# Patient Record
Sex: Female | Born: 1995
Health system: Southern US, Community
[De-identification: ages and names within clinical notes are randomized; demographics above are authoritative.]

## PROBLEM LIST (undated history)

## (undated) DIAGNOSIS — I10 Essential (primary) hypertension: Secondary | ICD-10-CM

## (undated) DIAGNOSIS — Z789 Other specified health status: Secondary | ICD-10-CM

## (undated) DIAGNOSIS — G43909 Migraine, unspecified, not intractable, without status migrainosus: Secondary | ICD-10-CM

## (undated) HISTORY — PX: NO PAST SURGERIES: SHX2092

## (undated) HISTORY — DX: Migraine, unspecified, not intractable, without status migrainosus: G43.909

## (undated) HISTORY — DX: Other specified health status: Z78.9

---
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
## 2014-01-05 ENCOUNTER — Ambulatory Visit (HOSPITAL_COMMUNITY)
Admission: RE | Admit: 2014-01-05 | Discharge: 2014-01-05 | Disposition: A | Discharge status: Home / Self Care | Payer: No Typology Code available for payment source | Source: Ambulatory Visit | Attending: Family Medicine | Admitting: Family Medicine

## 2014-01-05 ENCOUNTER — Ambulatory Visit (INDEPENDENT_AMBULATORY_CARE_PROVIDER_SITE_OTHER): Payer: No Typology Code available for payment source | Admitting: Family Medicine

## 2014-01-05 VITALS — BP 114/71 | HR 84 | Temp 98.1°F | Wt 146.0 lb

## 2014-01-05 DIAGNOSIS — Z3689 Encounter for other specified antenatal screening: Secondary | ICD-10-CM | POA: Insufficient documentation

## 2014-01-05 DIAGNOSIS — Z3480 Encounter for supervision of other normal pregnancy, unspecified trimester: Secondary | ICD-10-CM

## 2014-01-05 NOTE — Progress Notes (Signed)
Brittney OlszewskiGloria Vargas Cummings is a 18 y.o. G2P1001 at 2438w0d for routine follow up.   She reports that she is feeling well.  No bleeding, vaginal discharge, contractions, LOF.  Good fetal movement.   See flow sheet for details.  A/P: Pregnancy at 2838w0d.  Doing well.   Pregnancy issues include - None Repeat anatomy scan was done today.  Awaiting final read. Preterm labor precautions reviewed. Follow up 4 weeks.

## 2014-01-05 NOTE — Patient Instructions (Signed)
You're doing great.  Follow up in 4 weeks - Labs, Glucola and Tdap at that visit.   Second Trimester of Pregnancy The second trimester is from week 13 through week 28, months 4 through 6. The second trimester is often a time when you feel your best. Your body has also adjusted to being pregnant, and you begin to feel better physically. Usually, morning sickness has lessened or quit completely, you may have more energy, and you may have an increase in appetite. The second trimester is also a time when the fetus is growing rapidly. At the end of the sixth month, the fetus is about 9 inches long and weighs about 1 pounds. You will likely begin to feel the baby move (quickening) between 18 and 20 weeks of the pregnancy. BODY CHANGES Your body goes through many changes during pregnancy. The changes vary from woman to woman.   Your weight will continue to increase. You will notice your lower abdomen bulging out.  You may begin to get stretch marks on your hips, abdomen, and breasts.  You may develop headaches that can be relieved by medicines approved by your caregiver.  You may urinate more often because the fetus is pressing on your bladder.  You may develop or continue to have heartburn as a result of your pregnancy.  You may develop constipation because certain hormones are causing the muscles that push waste through your intestines to slow down.  You may develop hemorrhoids or swollen, bulging veins (varicose veins).  You may have back pain because of the weight gain and pregnancy hormones relaxing your joints between the bones in your pelvis and as a result of a shift in weight and the muscles that support your balance.  Your breasts will continue to grow and be tender.  Your gums may bleed and may be sensitive to brushing and flossing.  Dark spots or blotches (chloasma, mask of pregnancy) may develop on your face. This will likely fade after the baby is born.  A dark line from your  belly button to the pubic area (linea nigra) may appear. This will likely fade after the baby is born. WHAT TO EXPECT AT YOUR PRENATAL VISITS During a routine prenatal visit:  You will be weighed to make sure you and the fetus are growing normally.  Your blood pressure will be taken.  Your abdomen will be measured to track your baby's growth.  The fetal heartbeat will be listened to.  Any test results from the previous visit will be discussed. Your caregiver may ask you:  How you are feeling.  If you are feeling the baby move.  If you have had any abnormal symptoms, such as leaking fluid, bleeding, severe headaches, or abdominal cramping.  If you have any questions. Other tests that may be performed during your second trimester include:  Blood tests that check for:  Low iron levels (anemia).  Gestational diabetes (between 24 and 28 weeks).  Rh antibodies.  Urine tests to check for infections, diabetes, or protein in the urine.  An ultrasound to confirm the proper growth and development of the baby.  An amniocentesis to check for possible genetic problems.  Fetal screens for spina bifida and Down syndrome. HOME CARE INSTRUCTIONS   Avoid all smoking, herbs, alcohol, and unprescribed drugs. These chemicals affect the formation and growth of the baby.  Follow your caregiver's instructions regarding medicine use. There are medicines that are either safe or unsafe to take during pregnancy.  Exercise only as  directed by your caregiver. Experiencing uterine cramps is a good sign to stop exercising.  Continue to eat regular, healthy meals.  Wear a good support bra for breast tenderness.  Do not use hot tubs, steam rooms, or saunas.  Wear your seat belt at all times when driving.  Avoid raw meat, uncooked cheese, cat litter boxes, and soil used by cats. These carry germs that can cause birth defects in the baby.  Take your prenatal vitamins.  Try taking a stool  softener (if your caregiver approves) if you develop constipation. Eat more high-fiber foods, such as fresh vegetables or fruit and whole grains. Drink plenty of fluids to keep your urine clear or pale yellow.  Take warm sitz baths to soothe any pain or discomfort caused by hemorrhoids. Use hemorrhoid cream if your caregiver approves.  If you develop varicose veins, wear support hose. Elevate your feet for 15 minutes, 3 4 times a day. Limit salt in your diet.  Avoid heavy lifting, wear low heel shoes, and practice good posture.  Rest with your legs elevated if you have leg cramps or low back pain.  Visit your dentist if you have not gone yet during your pregnancy. Use a soft toothbrush to brush your teeth and be gentle when you floss.  A sexual relationship may be continued unless your caregiver directs you otherwise.  Continue to go to all your prenatal visits as directed by your caregiver. SEEK MEDICAL CARE IF:   You have dizziness.  You have mild pelvic cramps, pelvic pressure, or nagging pain in the abdominal area.  You have persistent nausea, vomiting, or diarrhea.  You have a bad smelling vaginal discharge.  You have pain with urination. SEEK IMMEDIATE MEDICAL CARE IF:   You have a fever.  You are leaking fluid from your vagina.  You have spotting or bleeding from your vagina.  You have severe abdominal cramping or pain.  You have rapid weight gain or loss.  You have shortness of breath with chest pain.  You notice sudden or extreme swelling of your face, hands, ankles, feet, or legs.  You have not felt your baby move in over an hour.  You have severe headaches that do not go away with medicine.  You have vision changes. Document Released: 08/01/2001 Document Revised: 04/09/2013 Document Reviewed: 10/08/2012 [facility] Patient Information 2014 [place], [place].

## 2014-02-03 ENCOUNTER — Ambulatory Visit (INDEPENDENT_AMBULATORY_CARE_PROVIDER_SITE_OTHER): Payer: No Typology Code available for payment source | Admitting: Family Medicine

## 2014-02-03 VITALS — BP 104/59 | HR 87 | Temp 97.2°F | Wt 149.0 lb

## 2014-02-03 DIAGNOSIS — Z3480 Encounter for supervision of other normal pregnancy, unspecified trimester: Secondary | ICD-10-CM

## 2014-02-03 LAB — CBC
HCT: 33.3 % — ABNORMAL LOW (ref 36.0–46.0)
Hemoglobin: 11.1 g/dL — ABNORMAL LOW (ref 12.0–15.0)
MCH: 30.1 pg (ref 26.0–34.0)
MCHC: 33.3 g/dL (ref 30.0–36.0)
MCV: 90.2 fL (ref 78.0–100.0)
PLATELETS: 298 10*3/uL (ref 150–400)
RBC: 3.69 MIL/uL — ABNORMAL LOW (ref 3.87–5.11)
RDW: 14.1 % (ref 11.5–15.5)
WBC: 6.9 10*3/uL (ref 4.0–10.5)

## 2014-02-03 LAB — RPR

## 2014-02-03 LAB — GLUCOSE, CAPILLARY
Comment 1: 1
Glucose-Capillary: 88 mg/dL (ref 70–99)

## 2014-02-03 NOTE — Progress Notes (Signed)
Lula OlszewskiGloria Vargas Hernandez is a 18 y.o. G2P1001 at 7724w1d for routine follow up.   She reports that she is doing well.  Denies bleeding, loss of fluid, contractions.  Good fetal movement. See flow sheet for details.  A/P: Pregnancy at 6324w1d.  Doing well.   Pregnancy issues include - None. Glucola and 28 week labs today. Preterm labor precautions reviewed. Follow up 3 weeks.

## 2014-02-03 NOTE — Patient Instructions (Signed)
It was nice to see you today.  You will not hear from us unless your lab results are abnormal.  You're doing great. Follow up in 3 weeks.

## 2014-02-04 LAB — HIV ANTIBODY (ROUTINE TESTING W REFLEX): HIV 1&2 Ab, 4th Generation: NONREACTIVE

## 2014-02-25 ENCOUNTER — Ambulatory Visit (INDEPENDENT_AMBULATORY_CARE_PROVIDER_SITE_OTHER): Payer: No Typology Code available for payment source | Admitting: Family Medicine

## 2014-02-25 VITALS — BP 120/82 | HR 90 | Wt 152.0 lb

## 2014-02-25 DIAGNOSIS — Z3483 Encounter for supervision of other normal pregnancy, third trimester: Secondary | ICD-10-CM

## 2014-02-25 DIAGNOSIS — Z3480 Encounter for supervision of other normal pregnancy, unspecified trimester: Secondary | ICD-10-CM

## 2014-02-25 DIAGNOSIS — Z23 Encounter for immunization: Secondary | ICD-10-CM

## 2014-02-25 NOTE — Progress Notes (Signed)
Brittney Cummings is a 18 y.o. G2P1001 at 4474w2d for routine follow up.  She reports that she is doing well. No current complaints. No bleeding, LOF.  Good fetal movement.  Occasional contractions earlier this week (2-3 times on Sunday). See flow sheet for details.  A/P: Pregnancy at 5874w2d.  Doing well.   Pregnancy issues include - None. Infant feeding choice - Breast. Contraception choice - OCP. Infant circumcision desired - No. Tdap given today. Preterm labor precautions reviewed. Kick counts reviewed. Follow up 2 weeks.

## 2014-03-12 ENCOUNTER — Ambulatory Visit (INDEPENDENT_AMBULATORY_CARE_PROVIDER_SITE_OTHER): Payer: No Typology Code available for payment source | Admitting: Family Medicine

## 2014-03-12 VITALS — BP 113/77 | HR 100 | Wt 153.8 lb

## 2014-03-12 DIAGNOSIS — Z3483 Encounter for supervision of other normal pregnancy, third trimester: Secondary | ICD-10-CM

## 2014-03-12 DIAGNOSIS — Z3480 Encounter for supervision of other normal pregnancy, unspecified trimester: Secondary | ICD-10-CM

## 2014-03-12 NOTE — Progress Notes (Signed)
Brittney OlszewskiGloria Vargas Cummings is a 18 y.o. G2P1001 at 2170w3d for routine follow up.   She reports that she is doing well.  She is having occasional contractions at night that last for ~ 30 secs.  No regular contractions.  No vaginal bleeding, loss of fluid.  Good Fetal movement.   See flow sheet for details.  A/P: Pregnancy at 1570w3d.  Doing well.   Preterm labor precautions reviewed.  Other urgent reasons to go to [facility] for evaluation reviewed (bleeding, LOF, decreased fetal movement.  Follow up 2 weeks.

## 2014-03-23 ENCOUNTER — Ambulatory Visit (INDEPENDENT_AMBULATORY_CARE_PROVIDER_SITE_OTHER): Payer: No Typology Code available for payment source | Admitting: Family Medicine

## 2014-03-23 VITALS — BP 116/65 | HR 84 | Temp 98.1°F | Wt 154.0 lb

## 2014-03-23 DIAGNOSIS — Z3480 Encounter for supervision of other normal pregnancy, unspecified trimester: Secondary | ICD-10-CM

## 2014-03-23 DIAGNOSIS — Z3483 Encounter for supervision of other normal pregnancy, third trimester: Secondary | ICD-10-CM

## 2014-03-23 NOTE — Progress Notes (Signed)
Lula OlszewskiGloria Vargas Hernandez is a 18 y.o. G2P1001 at 7460w0d for routine follow up.   She reports that she is doing well.  Occasional contractions (2-3 x weekly).  No bleeding. No loss of fluid.  Good fetal movement.   See flow sheet for details.  A/P: Pregnancy at 7260w0d.   No concerns.  Doing well.  Labor precautions reviewed.  Follow up 2 weeks.

## 2014-04-20 ENCOUNTER — Ambulatory Visit (INDEPENDENT_AMBULATORY_CARE_PROVIDER_SITE_OTHER): Payer: No Typology Code available for payment source | Admitting: Family Medicine

## 2014-04-20 VITALS — BP 119/71 | HR 84 | Temp 97.9°F | Wt 160.0 lb

## 2014-04-20 DIAGNOSIS — Z3483 Encounter for supervision of other normal pregnancy, third trimester: Secondary | ICD-10-CM

## 2014-04-20 DIAGNOSIS — Z3480 Encounter for supervision of other normal pregnancy, unspecified trimester: Secondary | ICD-10-CM

## 2014-04-20 NOTE — Progress Notes (Signed)
Brittney Cummings is a 18 y.o. G2P1001 at [redacted]w[redacted]d for routine follow up.   She reports that she is having intermittent contractions (occurs ~ 5 times daily).  They last for ~ 10-15 mins then resolved.  Pain 3-4/10 in severity.  No vaginal bleeding, LOF.  Good fetal movement.    She also reports right upper thigh/leg pain. She has had this for approximately 1 week.  Worse with activity/motion.  No relieving factors.  Associated thigh numbness.   See flow sheet for details.  A/P: Pregnancy at [redacted]w[redacted]d. Irregular contractions. Cervix check (see above).  - Reassured. - Labor precautions discussed  Thigh/leg pain - No sign of DVT on exam - Given history, this could be secondary to sciatica. - Patient reassured. Advised PRN Tylenol, rest, ice/heat.  Pregnancy issues include - None. Infant feeding choice - Breast.  Contraception choice - OCP.  Infant circumcision desired - No.  Labor precautions reviewed.

## 2014-04-20 NOTE — Patient Instructions (Signed)
It was nice to see you today.  You're doing well.  Try tylenol as needed for your pain.  Follow up in 1 week.

## 2014-04-22 ENCOUNTER — Encounter: Payer: Self-pay | Admitting: Family Medicine

## 2014-04-22 LAB — STREP B DNA PROBE: GBSP: DETECTED

## 2014-04-28 ENCOUNTER — Inpatient Hospital Stay (HOSPITAL_COMMUNITY)
Admission: AD | Admit: 2014-04-28 | Discharge: 2014-04-30 | DRG: 775 | Disposition: A | Discharge status: Home / Self Care | Payer: Medicaid Other | Source: Ambulatory Visit | Attending: Family Medicine | Admitting: Family Medicine

## 2014-04-28 ENCOUNTER — Encounter (HOSPITAL_COMMUNITY): Payer: Self-pay | Admitting: *Deleted

## 2014-04-28 ENCOUNTER — Inpatient Hospital Stay (HOSPITAL_COMMUNITY): Payer: Medicaid Other | Admitting: Anesthesiology

## 2014-04-28 ENCOUNTER — Telehealth: Payer: Self-pay | Admitting: Family Medicine

## 2014-04-28 ENCOUNTER — Inpatient Hospital Stay (HOSPITAL_COMMUNITY)
Admission: AD | Admit: 2014-04-28 | Discharge: 2014-04-28 | Disposition: A | Discharge status: Home / Self Care | Payer: Medicaid Other | Source: Ambulatory Visit | Attending: Family Medicine | Admitting: Family Medicine

## 2014-04-28 ENCOUNTER — Encounter (HOSPITAL_COMMUNITY): Payer: Medicaid Other | Admitting: Anesthesiology

## 2014-04-28 DIAGNOSIS — O479 False labor, unspecified: Secondary | ICD-10-CM

## 2014-04-28 DIAGNOSIS — O99892 Other specified diseases and conditions complicating childbirth: Secondary | ICD-10-CM | POA: Diagnosis present

## 2014-04-28 DIAGNOSIS — O368390 Maternal care for abnormalities of the fetal heart rate or rhythm, unspecified trimester, not applicable or unspecified: Secondary | ICD-10-CM

## 2014-04-28 DIAGNOSIS — O9989 Other specified diseases and conditions complicating pregnancy, childbirth and the puerperium: Principal | ICD-10-CM

## 2014-04-28 DIAGNOSIS — IMO0001 Reserved for inherently not codable concepts without codable children: Secondary | ICD-10-CM

## 2014-04-28 DIAGNOSIS — Z22330 Carrier of Group B streptococcus: Secondary | ICD-10-CM

## 2014-04-28 LAB — CBC
HCT: 38.6 % (ref 36.0–46.0)
Hemoglobin: 12.2 g/dL (ref 12.0–15.0)
MCH: 27.9 pg (ref 26.0–34.0)
MCHC: 31.6 g/dL (ref 30.0–36.0)
MCV: 88.3 fL (ref 78.0–100.0)
PLATELETS: 270 10*3/uL (ref 150–400)
RBC: 4.37 MIL/uL (ref 3.87–5.11)
RDW: 14.1 % (ref 11.5–15.5)
WBC: 10.9 10*3/uL — AB (ref 4.0–10.5)

## 2014-04-28 LAB — RPR

## 2014-04-28 LAB — TYPE AND SCREEN
ABO/RH(D): O POS
ANTIBODY SCREEN: NEGATIVE

## 2014-04-28 LAB — OB RESULTS CONSOLE GC/CHLAMYDIA
Chlamydia: NEGATIVE
Gonorrhea: NEGATIVE

## 2014-04-28 LAB — ABO/RH: ABO/RH(D): O POS

## 2014-04-28 MED ORDER — FENTANYL CITRATE 0.05 MG/ML IJ SOLN
100.0000 ug | INTRAMUSCULAR | Status: DC | PRN
  Administered 2014-04-28 (×2): 100 ug via INTRAVENOUS
  Filled 2014-04-28 (×2): qty 2

## 2014-04-28 MED ORDER — OXYCODONE-ACETAMINOPHEN 5-325 MG PO TABS: 2.0000 | ORAL_TABLET | ORAL | Status: DC | PRN

## 2014-04-28 MED ORDER — FENTANYL 2.5 MCG/ML BUPIVACAINE 1/10 % EPIDURAL INFUSION (WH - ANES)
INTRAMUSCULAR | Status: DC | PRN
  Administered 2014-04-28: 14 mL/h via EPIDURAL

## 2014-04-28 MED ORDER — SIMETHICONE 80 MG PO CHEW: 80.0000 mg | CHEWABLE_TABLET | ORAL | Status: DC | PRN

## 2014-04-28 MED ORDER — OXYTOCIN 40 UNITS IN LACTATED RINGERS INFUSION - SIMPLE MED
62.5000 mL/h | INTRAVENOUS | Status: DC
  Filled 2014-04-28: qty 1000

## 2014-04-28 MED ORDER — PHENYLEPHRINE 40 MCG/ML (10ML) SYRINGE FOR IV PUSH (FOR BLOOD PRESSURE SUPPORT)
PREFILLED_SYRINGE | INTRAVENOUS | Status: AC
  Filled 2014-04-28: qty 10

## 2014-04-28 MED ORDER — PENICILLIN G POTASSIUM 5000000 UNITS IJ SOLR
5.0000 10*6.[IU] | Freq: Once | INTRAVENOUS | Status: AC
  Administered 2014-04-28: 5 10*6.[IU] via INTRAVENOUS
  Filled 2014-04-28: qty 5

## 2014-04-28 MED ORDER — EPHEDRINE 5 MG/ML INJ
10.0000 mg | INTRAVENOUS | Status: DC | PRN
  Filled 2014-04-28: qty 2

## 2014-04-28 MED ORDER — BENZOCAINE-MENTHOL 20-0.5 % EX AERO: 1.0000 "application " | INHALATION_SPRAY | CUTANEOUS | Status: DC | PRN

## 2014-04-28 MED ORDER — PRENATAL MULTIVITAMIN CH
1.0000 | ORAL_TABLET | Freq: Every day | ORAL | Status: DC
  Administered 2014-04-29: 1 via ORAL
  Filled 2014-04-28: qty 1

## 2014-04-28 MED ORDER — LANOLIN HYDROUS EX OINT: TOPICAL_OINTMENT | CUTANEOUS | Status: DC | PRN

## 2014-04-28 MED ORDER — ZOLPIDEM TARTRATE 5 MG PO TABS: 5.0000 mg | ORAL_TABLET | Freq: Every evening | ORAL | Status: DC | PRN

## 2014-04-28 MED ORDER — ONDANSETRON HCL 4 MG PO TABS: 4.0000 mg | ORAL_TABLET | ORAL | Status: DC | PRN

## 2014-04-28 MED ORDER — FLEET ENEMA 7-19 GM/118ML RE ENEM: 1.0000 | ENEMA | RECTAL | Status: DC | PRN

## 2014-04-28 MED ORDER — FENTANYL 2.5 MCG/ML BUPIVACAINE 1/10 % EPIDURAL INFUSION (WH - ANES)
INTRAMUSCULAR | Status: AC
  Administered 2014-04-28: 14 mL/h via EPIDURAL
  Filled 2014-04-28: qty 125

## 2014-04-28 MED ORDER — DIPHENHYDRAMINE HCL 25 MG PO CAPS: 25.0000 mg | ORAL_CAPSULE | Freq: Four times a day (QID) | ORAL | Status: DC | PRN

## 2014-04-28 MED ORDER — PHENYLEPHRINE 40 MCG/ML (10ML) SYRINGE FOR IV PUSH (FOR BLOOD PRESSURE SUPPORT)
80.0000 ug | PREFILLED_SYRINGE | INTRAVENOUS | Status: DC | PRN
  Filled 2014-04-28: qty 2

## 2014-04-28 MED ORDER — LACTATED RINGERS IV SOLN
INTRAVENOUS | Status: DC
  Administered 2014-04-28 (×2): via INTRAVENOUS

## 2014-04-28 MED ORDER — LIDOCAINE HCL (PF) 1 % IJ SOLN
INTRAMUSCULAR | Status: DC | PRN
  Administered 2014-04-28 (×2): 8 mL

## 2014-04-28 MED ORDER — BUTORPHANOL TARTRATE 1 MG/ML IJ SOLN
1.0000 mg | INTRAMUSCULAR | Status: DC | PRN
  Administered 2014-04-28: 1 mg via INTRAVENOUS

## 2014-04-28 MED ORDER — DIBUCAINE 1 % RE OINT: 1.0000 "application " | TOPICAL_OINTMENT | RECTAL | Status: DC | PRN

## 2014-04-28 MED ORDER — BUTORPHANOL TARTRATE 1 MG/ML IJ SOLN
1.0000 mg | INTRAMUSCULAR | Status: DC | PRN
  Filled 2014-04-28: qty 1

## 2014-04-28 MED ORDER — ONDANSETRON HCL 4 MG/2ML IJ SOLN: 4.0000 mg | Freq: Four times a day (QID) | INTRAMUSCULAR | Status: DC | PRN

## 2014-04-28 MED ORDER — TETANUS-DIPHTH-ACELL PERTUSSIS 5-2.5-18.5 LF-MCG/0.5 IM SUSP: 0.5000 mL | Freq: Once | INTRAMUSCULAR | Status: DC

## 2014-04-28 MED ORDER — ACETAMINOPHEN 325 MG PO TABS: 650.0000 mg | ORAL_TABLET | ORAL | Status: DC | PRN

## 2014-04-28 MED ORDER — NALOXONE HCL 0.4 MG/ML IJ SOLN
INTRAMUSCULAR | Status: AC
  Filled 2014-04-28: qty 1

## 2014-04-28 MED ORDER — FENTANYL 2.5 MCG/ML BUPIVACAINE 1/10 % EPIDURAL INFUSION (WH - ANES)
14.0000 mL/h | INTRAMUSCULAR | Status: DC | PRN
  Administered 2014-04-28: 14 mL/h via EPIDURAL

## 2014-04-28 MED ORDER — LIDOCAINE HCL (PF) 1 % IJ SOLN
30.0000 mL | INTRAMUSCULAR | Status: DC | PRN
  Filled 2014-04-28: qty 30

## 2014-04-28 MED ORDER — NALBUPHINE HCL 10 MG/ML IJ SOLN
5.0000 mg | INTRAMUSCULAR | Status: DC | PRN
  Administered 2014-04-28: 5 mg via INTRAVENOUS
  Filled 2014-04-28: qty 1

## 2014-04-28 MED ORDER — DIPHENHYDRAMINE HCL 50 MG/ML IJ SOLN: 12.5000 mg | INTRAMUSCULAR | Status: DC | PRN

## 2014-04-28 MED ORDER — OXYTOCIN BOLUS FROM INFUSION
500.0000 mL | INTRAVENOUS | Status: DC
  Administered 2014-04-28: 500 mL via INTRAVENOUS

## 2014-04-28 MED ORDER — OXYCODONE-ACETAMINOPHEN 5-325 MG PO TABS: 1.0000 | ORAL_TABLET | ORAL | Status: DC | PRN

## 2014-04-28 MED ORDER — LACTATED RINGERS IV SOLN
500.0000 mL | INTRAVENOUS | Status: DC | PRN
  Administered 2014-04-28: 500 mL via INTRAVENOUS

## 2014-04-28 MED ORDER — CITRIC ACID-SODIUM CITRATE 334-500 MG/5ML PO SOLN: 30.0000 mL | ORAL | Status: DC | PRN

## 2014-04-28 MED ORDER — EPHEDRINE 5 MG/ML INJ
10.0000 mg | INTRAVENOUS | Status: DC | PRN
Start: 2014-04-28 — End: 2014-04-28
  Filled 2014-04-28: qty 2

## 2014-04-28 MED ORDER — IBUPROFEN 600 MG PO TABS
600.0000 mg | ORAL_TABLET | Freq: Four times a day (QID) | ORAL | Status: DC
  Administered 2014-04-29 – 2014-04-30 (×6): 600 mg via ORAL
  Filled 2014-04-28 (×6): qty 1

## 2014-04-28 MED ORDER — ONDANSETRON HCL 4 MG/2ML IJ SOLN: 4.0000 mg | INTRAMUSCULAR | Status: DC | PRN

## 2014-04-28 MED ORDER — PENICILLIN G POTASSIUM 5000000 UNITS IJ SOLR
2.5000 10*6.[IU] | INTRAVENOUS | Status: DC
  Administered 2014-04-28: 2.5 10*6.[IU] via INTRAVENOUS
  Filled 2014-04-28 (×4): qty 2.5

## 2014-04-28 MED ORDER — LACTATED RINGERS IV SOLN: 500.0000 mL | Freq: Once | INTRAVENOUS | Status: DC

## 2014-04-28 MED ORDER — SENNOSIDES-DOCUSATE SODIUM 8.6-50 MG PO TABS
2.0000 | ORAL_TABLET | ORAL | Status: DC
Start: 2014-04-29 — End: 2014-04-30
  Administered 2014-04-29 (×2): 2 via ORAL
  Filled 2014-04-28 (×2): qty 2

## 2014-04-28 MED ORDER — WITCH HAZEL-GLYCERIN EX PADS: 1.0000 "application " | MEDICATED_PAD | CUTANEOUS | Status: DC | PRN

## 2014-04-28 NOTE — MAU Provider Note (Signed)
First Provider Initiated Contact with Patient 04/28/14 [phone]      Chief Complaint:  Labor Eval   Brittney Cummings is  18 y.o. G2P1001 at [redacted]w[redacted]d presents complaining of Labor Eval She noted contractions started at 5am and have been occuring q5 minutes. She also noted a bloody/mucous discharge this AM. No LOF. Good fetal movement  No issues with prenatal care per the patient; she currently sees Dr. Adriana Simas at [facility].   Obstetrical/Gynecological History: OB History   Grav Para Term Preterm Abortions TAB SAB Ect Mult Living   Past Medical History: Past Medical History  Diagnosis Date  . Medical history non-contributory     Past Surgical History: History reviewed. No pertinent past surgical history.  Family History: History reviewed. No pertinent family history.  Social History: History  Substance Use Topics  . Smoking status: Never Smoker   . Smokeless tobacco: Never Used  . Alcohol Use: No    Allergies:  Allergies  Allergen Reactions  . Mushroom Extract Complex Swelling    Meds:  Prescriptions prior to admission  Medication Sig Dispense Refill  . Prenatal Vit-Fe Fumarate-FA (PRENATAL MULTIVITAMIN) TABS tablet Take 1 tablet by mouth daily at 12 noon.        Review of Systems -   Review of Systems  Constitutional: Negative for fever, chills, weight loss, malaise/fatigue and diaphoresis.  HENT: Negative for hearing loss, ear pain, nosebleeds, congestion, sore throat, neck pain, tinnitus and ear discharge.   Eyes: Negative for blurred vision, double vision, photophobia, pain, discharge and redness.  Respiratory: Negative for cough, hemoptysis, sputum production, shortness of breath, wheezing and stridor.   Cardiovascular: Negative for chest pain, palpitations, orthopnea,  leg swelling  Gastrointestinal: Negative for abdominal pain heartburn, nausea, vomiting, diarrhea, constipation, blood in stool Genitourinary: Negative for  dysuria, urgency, frequency, hematuria and flank pain.  Musculoskeletal: Negative for myalgias, back pain, joint pain and falls.  Skin: Negative for itching and rash.  Neurological: Negative for dizziness, tingling, tremors, sensory change, speech change, focal weakness, seizures, loss of consciousness, weakness and headaches.  Endo/Heme/Allergies: Negative for environmental allergies and polydipsia. Does not bruise/bleed easily.  Psychiatric/Behavioral: Negative for depression, suicidal ideas, hallucinations, memory loss and substance abuse. The patient is not nervous/anxious and does not have insomnia.      Physical Exam  Blood pressure 123/81, pulse 65, temperature 97.8 F (36.6 C), temperature source Oral, resp. rate 16, height 5' (1.524 m), weight 74.39 kg (164 lb), last menstrual period 07/28/2013, SpO2 100.00%, currently breastfeeding. GENERAL: Well-developed, well-nourished female in no acute distress.  LUNGS: Clear to auscultation bilaterally.  HEART: Regular rate and rhythm. ABDOMEN: Soft, nontender, nondistended, gravid.  EXTREMITIES: Nontender, no edema, 2+ distal pulses. DTR's 2+ CERVICAL EXAM: 2.5/70/-2, anterior  Presentation: cephalic FHT:  Baseline rate 130 bpm   Variability moderate  Accelerations present   Decelerations none Contractions: Every 2-6 mins   Assessment: Brittney Cummings is  18 y.o. G2P1001 at [redacted]w[redacted]d presents with contractions q5 minutes with a practically unchanged cervical exam after 3 hours. Did note an audible irregular fetal heart tone (sounds like a occasional additional beat).   Plan: - Not in active labor - Discussed irregular heart tones with Dr. Adrian Blackwater who felt she was okay be be discharged without further work-up. - Labor precautions and kick counts discussed.   Joanna Puff 9/8/20159:55 AM  I was consulted RE:POC and reviewed NST and  agree with above.  [place], CNM 04/28/2014 10:50 PM

## 2014-04-28 NOTE — H&P (Signed)
Brittney Cummings is a 18 y.o. female G2P1001 at [redacted]w[redacted]d who presents with SOL.   Patient had frequent, regular contractions (every 5 mins) which began at ~ 5 am this morning.  She had associated bloody show.  No LOF and good fetal movement.  She subsequently went to the MAU for a labor check.  Her cervix was 2.5 cm and she did not make any cervical change for 3 hours and was thus sent home.  She later returned early this afternoon around noon with worsening pain and more frequent contractions.  Cervix was noted to be 4 cm and she was subsequently admitted to L&D.  PNC: [facility]. GBS +.  No GDM.    History OB History   Grav Para Term Preterm Abortions TAB SAB Ect Mult Living   Past Medical History  Diagnosis Date  . Medical history non-contributory    History reviewed. No pertinent past surgical history. Family History: No pertinent family Hx.  Social History:  reports that she has never smoked. She has never used smokeless tobacco. She reports that she does not drink alcohol or use illicit drugs.   Prenatal Transfer Tool  Maternal Diabetes: No; Normal 1 hour gtt (88) Genetic Screening: Normal Maternal Ultrasounds/Referrals: Normal Fetal Ultrasounds or other Referrals:  None Maternal Substance Abuse:  No Significant Maternal Medications:  None Significant Maternal Lab Results:  Lab values include: Group B Strep positive  ROS Per HPI  Blood pressure 124/75, pulse 56, temperature 98.2 F (36.8 C), resp. rate 20, height 5' (1.524 m), weight 164 lb (74.39 kg), last menstrual period 07/28/2013, SpO2 99.00%, currently breastfeeding. Exam Physical Exam  Gen: well appearing female. NAD.  Heart: RRR Lungs: CTAB, NWOB Abd: gravid but otherwise soft, nontender to palpation Ext: no appreciable lower extremity edema bilaterally Neuro: grossly nonfocal, speech intact Cervical Exam:  Dilation: 4 Effacement (%): 80 Cervical Position:  Anterior Station: -1 Presentation: Vertex Exam by:: Quintella Baton rNC  FHR: baseline 115, Mod variability, 15x15 accels, no decels Toco: ~ Every 2 min, Regular  Prenatal labs: ABO, Rh: O/POS/-- (03/23 0931) Antibody: NEG (03/23 0931) Rubella: 3.10 (03/23 0931) RPR: NON REAC (06/16 1001)  HBsAg: NEGATIVE (03/23 0931)  HIV: NONREACTIVE (06/16 1001)  GBS: Detected (08/31 1525)   Assessment/Plan: Brittney Cummings is a 18 y.o. G2P1001 at [redacted]w[redacted]d by LMP who presents with SOL.   #Labor: SOL, progressing normally. #Pain: Fentanyl currently; switching to Nubain; Patient does not desire Epidural #FWB: Cat I #ID: GBS +; Treating with PCN. #MOF: Breast #MOC: OCP #Circ: Not desired.  Everlene Other 04/28/2014, 1:43 PM  I was consulted RE: POC and and agree with the above.  [place], [place] 04/28/2014 10:47 PM

## 2014-04-28 NOTE — Telephone Encounter (Signed)
Went to [employer] [employer] this morning with contractions 5 mins apart. Hospital discharged her--she was dilated 2-3 cm Pains are 2 minutes apart and stronger What should she do?

## 2014-04-28 NOTE — H&P (Signed)
Attestation of Attending Supervision of Advanced Practitioner (PA/CNM/NP): Evaluation and management procedures were performed by the Advanced Practitioner under my supervision and collaboration.  I have reviewed the Advanced Practitioner's note and chart, and I agree with the management and plan.  Areona Homer, DO Attending Physician Faculty Practice, [facility]  

## 2014-04-28 NOTE — MAU Note (Signed)
Contractions started at 5am and are every 5 mins. Had a bloody discharge as well. Denies LOF. Has felt decrease in movement since 5 am but felt movement prior to that.

## 2014-04-28 NOTE — Anesthesia Procedure Notes (Signed)
Epidural Patient location during procedure: OB Start time: 04/28/2014 4:34 PM End time: 04/28/2014 4:38 PM  Staffing Anesthesiologist: Leilani Able Performed by: anesthesiologist   Preanesthetic Checklist Completed: patient identified, surgical consent, pre-op evaluation, timeout performed, IV checked, risks and benefits discussed and monitors and equipment checked  Epidural Patient position: sitting Prep: site prepped and draped and DuraPrep Patient monitoring: continuous pulse ox and blood pressure Approach: midline Location: L3-L4 Injection technique: LOR air  Needle:  Needle type: Tuohy  Needle gauge: 17 G Needle length: 9 cm and 9 Needle insertion depth: 5 cm cm Catheter type: closed end flexible Catheter size: 19 Gauge Catheter at skin depth: 10 cm Test dose: negative and Other  Assessment Sensory level: T9 Events: blood not aspirated, injection not painful, no injection resistance, negative IV test and no paresthesia  Additional Notes Reason for block:procedure for pain

## 2014-04-28 NOTE — Progress Notes (Signed)
Ivonne Andrew CNM in delivery in BS. Notified via Lawyer in BS with CNM. Aware of pt's admission and status. Will admit to [facility]

## 2014-04-28 NOTE — MAU Provider Note (Signed)
Attestation of Attending Supervision of Advanced Practitioner (PA/CNM/NP): Evaluation and management procedures were performed by the Advanced Practitioner under my supervision and collaboration.  I have reviewed the Advanced Practitioner's note and chart, and I agree with the management and plan.  Jacob Stinson, DO Attending Physician Faculty Practice, [facility]  

## 2014-04-28 NOTE — Progress Notes (Signed)
Irregularity noted to FHR. No particular pattern

## 2014-04-28 NOTE — Telephone Encounter (Signed)
Unable to reach patient by phone. It looks like the patient was admitted to [facility] today around 12 pm.Busick, Rodena Medin

## 2014-04-28 NOTE — Progress Notes (Signed)
Report called to [employer] in Bs.

## 2014-04-28 NOTE — Progress Notes (Signed)
Brittney Cummings is a 18 y.o. G2P1001 at [redacted]w[redacted]d by LMP admitted for SOL.  Subjective: Pain is severe, 8/10. Patient has tried fentanyl, stadol, and nubain without significant relief.  Objective: BP 109/78  Pulse 61  Temp(Src) 96.7 F (35.9 C) (Axillary)  Resp 20  Ht 5' (1.524 m)  Wt 164 lb (74.39 kg)  BMI 32.03 kg/m2  SpO2 100%  LMP 07/28/2013      FHT:  FHR: 110 bpm, variability: moderate,  accelerations:  Present,  decelerations:  Absent UC:   Regular, Every 2-71min SVE:   Dilation: 6 Effacement (%): 90 Station: -1 Exam by:: Elana Alm RNC  Labs: Lab Results  Component Value Date   WBC 10.9* 04/28/2014   HGB 12.2 04/28/2014   HCT 38.6 04/28/2014   MCV 88.3 04/28/2014   PLT 270 04/28/2014    Assessment / Plan: Spontaneous labor, progressing normally  Labor: Progressing normally Fetal Wellbeing:  Category I Pain Control:  Now desires epidural I/D:  GBS +; PCN. Anticipated MOD:  NSVD  Everlene Other 04/28/2014, 4:10 PM

## 2014-04-28 NOTE — MAU Note (Signed)
Was seen here earlier this am for contractions. Stronger now

## 2014-04-28 NOTE — Anesthesia Preprocedure Evaluation (Signed)
Anesthesia Evaluation  Patient identified by MRN, date of birth, ID band Patient awake    Reviewed: Allergy & Precautions, H&P , NPO status , Patient's Chart, lab work & pertinent test results  Airway Mallampati: II TM Distance: >3 FB Neck ROM: full    Dental no notable dental hx.    Pulmonary neg pulmonary ROS,    Pulmonary exam normal       Cardiovascular negative cardio ROS      Neuro/Psych negative neurological ROS  negative psych ROS   GI/Hepatic negative GI ROS, Neg liver ROS,   Endo/Other  negative endocrine ROS  Renal/GU negative Renal ROS     Musculoskeletal   Abdominal Normal abdominal exam  (+)   Peds  Hematology negative hematology ROS (+)   Anesthesia Other Findings   Reproductive/Obstetrics (+) Pregnancy                           Anesthesia Physical Anesthesia Plan  ASA: II  Anesthesia Plan: Epidural   Post-op Pain Management:    Induction:   Airway Management Planned:   Additional Equipment:   Intra-op Plan:   Post-operative Plan:   Informed Consent: I have reviewed the patients History and Physical, chart, labs and discussed the procedure including the risks, benefits and alternatives for the proposed anesthesia with the patient or authorized representative who has indicated his/her understanding and acceptance.     Plan Discussed with:   Anesthesia Plan Comments:         Anesthesia Quick Evaluation  

## 2014-04-28 NOTE — Discharge Instructions (Signed)
Third Trimester of Pregnancy The third trimester is from week 29 through week 42, months 7 through 9. This trimester is when your unborn baby (fetus) is growing very fast. At the end of the ninth month, the unborn baby is about 20 inches in length. It weighs about 6-10 pounds.  HOME CARE   Avoid all smoking, herbs, and alcohol. Avoid drugs not approved by your doctor.  Only take medicine as told by your doctor. Some medicines are safe and some are not during pregnancy.  Exercise only as told by your doctor. Stop exercising if you start having cramps.  Eat regular, healthy meals.  Wear a good support bra if your breasts are tender.  Do not use hot tubs, steam rooms, or saunas.  Wear your seat belt when driving.  Avoid raw meat, uncooked cheese, and liter boxes and soil used by cats.  Take your prenatal vitamins.  Try taking medicine that helps you poop (stool softener) as needed, and if your doctor approves. Eat more fiber by eating fresh fruit, vegetables, and whole grains. Drink enough fluids to keep your pee (urine) clear or pale yellow.  Take warm water baths (sitz baths) to soothe pain or discomfort caused by hemorrhoids. Use hemorrhoid cream if your doctor approves.  If you have puffy, bulging veins (varicose veins), wear support hose. Raise (elevate) your feet for 15 minutes, 3-4 times a day. Limit salt in your diet.  Avoid heavy lifting, wear low heels, and sit up straight.  Rest with your legs raised if you have leg cramps or low back pain.  Visit your dentist if you have not gone during your pregnancy. Use a soft toothbrush to brush your teeth. Be gentle when you floss.  You can have sex (intercourse) unless your doctor tells you not to.  Do not travel far distances unless you must. Only do so with your doctor's approval.  Take prenatal classes.  Practice driving to the hospital.  Pack your hospital bag.  Prepare the baby's room.  Go to your doctor visits. GET  HELP IF:  You are not sure if you are in labor or if your water has broken.  You are dizzy.  You have mild cramps or pressure in your lower belly (abdominal).  You have a nagging pain in your belly area.  You continue to feel sick to your stomach (nauseous), throw up (vomit), or have watery poop (diarrhea).  You have bad smelling fluid coming from your vagina.  You have pain with peeing (urination). GET HELP RIGHT AWAY IF:   You have a fever.  You are leaking fluid from your vagina.  You are spotting or bleeding from your vagina.  You have severe belly cramping or pain.  You lose or gain weight rapidly.  You have trouble catching your breath and have chest pain.  You notice sudden or extreme puffiness (swelling) of your face, hands, ankles, feet, or legs.  You have not felt the baby move in over an hour.  You have severe headaches that do not go away with medicine.  You have vision changes. Document Released: 11/01/2009 Document Revised: 12/02/2012 Document Reviewed: 10/08/2012 ExitCare Patient Information 2015 [employer]. This information is not intended to replace advice given to you by your health care provider. Make sure you discuss any questions you have with your health care provider.  

## 2014-04-29 LAB — CBC
HEMATOCRIT: 30.2 % — AB (ref 36.0–46.0)
Hemoglobin: 10 g/dL — ABNORMAL LOW (ref 12.0–15.0)
MCH: 28.6 pg (ref 26.0–34.0)
MCHC: 33.1 g/dL (ref 30.0–36.0)
MCV: 86.3 fL (ref 78.0–100.0)
PLATELETS: 247 10*3/uL (ref 150–400)
RBC: 3.5 MIL/uL — ABNORMAL LOW (ref 3.87–5.11)
RDW: 13.9 % (ref 11.5–15.5)
WBC: 12.5 10*3/uL — AB (ref 4.0–10.5)

## 2014-04-29 LAB — GC/CHLAMYDIA PROBE AMP
CT PROBE, AMP APTIMA: NEGATIVE
GC PROBE AMP APTIMA: NEGATIVE

## 2014-04-29 NOTE — Progress Notes (Signed)
UR chart review completed.  

## 2014-04-29 NOTE — Lactation Note (Signed)
This note was copied from the chart of Brittney Cummings. Lactation Consultation Note Initial visit at 21 hours of age.  Mom reports baby just finished a feeding and is now held by visitor and mom is eating.  Mom has 2 months experience with breast feeding older child, but when she started birth control pills baby stopped drinking milk from breast and bottle mom think it was the taste of the milk.  Mom does plan to continue same birth control plan this time and cautioned baby may react different.  [facility] LC resources given and discussed.  Encouraged to feed with early cues on demand.  Early newborn behavior discussed.  Mom reports doing hand expressin with colostrum visible.  Mom to call for assist as needed.    Patient Name: Brittney Tamina Cyphers Today's Date: 04/29/2014 Reason for consult: Initial assessment   Maternal Data Has patient been taught Hand Expression?: Yes Does the patient have breastfeeding experience prior to this delivery?: Yes  Feeding Feeding Type: Breast Fed Length of feed: 25 min  LATCH Score/Interventions                Intervention(s): Breastfeeding basics reviewed     Lactation Tools Discussed/Used WIC Program: Yes   Consult Status Consult Status: Follow-up Date: 04/30/14 Follow-up type: In-patient    [place]w, Arvella Merles 04/29/2014, 5:52 PM

## 2014-04-29 NOTE — Anesthesia Postprocedure Evaluation (Signed)
Anesthesia Post Note  Patient: Brittney Cummings  Procedure(s) Performed: * No procedures listed *  Anesthesia type: Epidural  Patient location: Mother/Baby  Post pain: Pain level controlled  Post assessment: Post-op Vital signs reviewed  Last Vitals:  Filed Vitals:   04/29/14 0300  BP: 113/50  Pulse: 77  Temp: 37.2 C  Resp: 18    Post vital signs: Reviewed  Level of consciousness: awake  Complications: No apparent anesthesia complications

## 2014-04-29 NOTE — Progress Notes (Signed)
Post Partum Day 1  Subjective: Doing well, no complaints. Up ad lib. Voiding well. Passing flatus. Lochia - minimal.  Objective: Blood pressure 113/50, pulse 77, temperature 98.9 F (37.2 C), temperature source Oral, resp. rate 18, height 5' (1.524 m), weight 164 lb (74.39 kg), last menstrual period 07/28/2013, SpO2 99.00%, unknown if currently breastfeeding.  Physical Exam:  General: alert, cooperative and no distress Lochia: appropriate Uterine Fundus: firm DVT Evaluation: No evidence of DVT seen on physical exam. No cords or calf tenderness. No significant calf/ankle edema.   Recent Labs  04/28/14 1210  HGB 12.2  HCT 38.6   Assessment/Plan: PPD # 1, NSVD. Doing well.  Planning OCP for birth control. Breastfeeding. Hopeful for D/C tomorrow pending status of baby.    LOS: 1 day   Everlene Other 04/29/2014, 6:27 AM   OB fellow attestation Post Partum Day 1 I have seen and examined this patient and agree with above documentation in the resident's note.   Brittney Cummings is a 18 y.o. [id number] s/p NSVD on 9/9 at 2000.  Pt denies problems with ambulating, voiding or po intake. Pain is well controlled.  Plan for birth control is oral progesterone-only contraceptive.  Method of Feeding: breast  PE:  BP 113/50  Pulse 77  Temp(Src) 98.9 F (37.2 C) (Oral)  Resp 18  Ht 5' (1.524 m)  Wt 164 lb (74.39 kg)  BMI 32.03 kg/m2  SpO2 99%  LMP 07/28/2013  Breastfeeding? Unknown Fundus firm  Plan for discharge: PPD2  William Dalton, MD 7:55 AM

## 2014-04-30 MED ORDER — IBUPROFEN 600 MG PO TABS: 600.0000 mg | ORAL_TABLET | Freq: Four times a day (QID) | ORAL | Status: DC

## 2014-04-30 MED ORDER — DOCUSATE SODIUM 100 MG PO CAPS: 100.0000 mg | ORAL_CAPSULE | Freq: Two times a day (BID) | ORAL | Status: DC | PRN

## 2014-04-30 MED ORDER — PRENATAL MULTIVITAMIN CH: 1.0000 | ORAL_TABLET | Freq: Every day | ORAL | Status: DC

## 2014-04-30 NOTE — Discharge Instructions (Signed)

## 2014-04-30 NOTE — Discharge Summary (Signed)
Obstetric Discharge Summary Reason for Admission: onset of labor Prenatal Procedures: ultrasound Intrapartum Procedures: spontaneous vaginal delivery Postpartum Procedures: none Complications-Operative and Postpartum: none  Hemoglobin  Date Value Ref Range Status  04/29/2014 10.0* 12.0 - 15.0 g/dL Final     DELTA CHECK NOTED     REPEATED TO VERIFY     HCT  Date Value Ref Range Status  04/29/2014 30.2* 36.0 - 46.0 % Final    Physical Exam:  General: alert, cooperative and no distress Lochia: appropriate Uterine Fundus: firm DVT Evaluation: No evidence of DVT seen on physical exam. No cords or calf tenderness. No significant calf/ankle edema.  Discharge Diagnoses: Term Pregnancy-delivered  Brief Hospital Course:  18 year old G2P1001 presented with active labor.  Patient progressed well, but labor became protracted at ~ 6cm.   At that time AROM was done and patient progressed quickly and delivered a viable female via NSVD.  No complications post partum.  Patient is breastfeeding and planning OCP for contraception.  Delivery Note At 7:58 PM a viable female was delivered via Vaginal, Spontaneous Delivery (Presentation: ROA).  APGAR:  9,9; weight - pending.  Placenta status: Intact, Spontaneous.  Cord:  3 vessel with the following complications: None.  Cord pH: N/A.  Anesthesia: Epidural  Episiotomy: N/A Lacerations: None Est. Blood Loss (mL): 200 mL  Mother delivered over intact perineum.  Baby vigorous with Apgars of 9 & 9. Placenta delivered quickly without difficulty.  Minimal bleeding.   Mom to postpartum.  Baby to Couplet care / Skin to Skin.  Discharge Information: Date: 04/30/2014 Activity: pelvic rest Diet: routine Medications: PNV, Ibuprofen and Colace Condition: stable Instructions: refer to practice specific booklet Discharge to: home   Newborn Data: Live born female  Birth Weight: 6 lb 12.5 oz (3075 g) APGAR: 9, 9  Home with mother.  Everlene Other 04/30/2014,  6:31 AM  Pt D/C'd before CNM able to round behind. I reviewed the chart and agree with above.  [place], CNM 04/30/2014 12:16 PM  Attestation of Attending Supervision of Advanced Practitioner (PA/CNM/NP): Evaluation and management procedures were performed by the Advanced Practitioner under my supervision and collaboration.  I have reviewed the Advanced Practitioner's note and chart, and I agree with the management and plan.  Candelaria Celeste, DO Attending Physician Faculty Practice, [facility] [place]

## 2014-05-01 ENCOUNTER — Encounter: Payer: No Typology Code available for payment source | Admitting: Family Medicine

## 2014-06-22 ENCOUNTER — Encounter (HOSPITAL_COMMUNITY): Payer: Self-pay | Admitting: *Deleted

## 2017-09-15 ENCOUNTER — Ambulatory Visit (INDEPENDENT_AMBULATORY_CARE_PROVIDER_SITE_OTHER): Payer: Self-pay

## 2017-09-15 ENCOUNTER — Encounter (HOSPITAL_COMMUNITY): Payer: Self-pay | Admitting: Family Medicine

## 2017-09-15 ENCOUNTER — Ambulatory Visit (HOSPITAL_COMMUNITY)
Admission: EM | Admit: 2017-09-15 | Discharge: 2017-09-15 | Disposition: A | Discharge status: Home / Self Care | Payer: Self-pay | Attending: Internal Medicine | Admitting: Internal Medicine

## 2017-09-15 DIAGNOSIS — S82001A Unspecified fracture of right patella, initial encounter for closed fracture: Secondary | ICD-10-CM

## 2017-09-15 DIAGNOSIS — W101XXA Fall (on)(from) sidewalk curb, initial encounter: Secondary | ICD-10-CM

## 2017-09-15 MED ORDER — NAPROXEN 500 MG PO TABS: 500.0000 mg | ORAL_TABLET | Freq: Two times a day (BID) | ORAL | 0 refills | Status: DC

## 2017-09-15 NOTE — ED Provider Notes (Signed)
[facility]    CSN: [id number] Arrival date & time: 09/15/17  1750     History   Chief Complaint Chief Complaint  Patient presents with  . Fall  . Knee Pain    HPI Lula OlszewskiGloria Vargas Hernandez is a 22 y.o. female.   Presents with left knee pain after falling and landing on a curb. She reports hitting the front of her knee on the curb. Pain with weight bearing and in the "front" of her knee. Pain with flexion. She has not noted any swelling.       Past Medical History:  Diagnosis Date  . Medical history non-contributory     Patient Active Problem List   Diagnosis Date Noted  . Fetal arrhythmia affecting pregnancy, antepartum 04/28/2014  . Active labor 04/28/2014  . Supervision of other normal pregnancy 11/17/2013    History reviewed. No pertinent surgical history.  OB History    Gravida Para Term Preterm AB Living   2 2 2     2    SAB TAB Ectopic Multiple Live Births           2       Home Medications    Prior to Admission medications   Medication Sig Start Date End Date Taking? Authorizing Provider  docusate sodium (COLACE) 100 MG capsule Take 1 capsule (100 mg total) by mouth 2 (two) times daily as needed for mild constipation or moderate constipation. 04/30/14   Tommie Samsook, Jayce G, DO  ibuprofen (ADVIL,MOTRIN) 600 MG tablet Take 1 tablet (600 mg total) by mouth every 6 (six) hours. 04/30/14   Tommie Samsook, Jayce G, DO  Prenatal Vit-Fe Fumarate-FA (PRENATAL MULTIVITAMIN) TABS tablet Take 1 tablet by mouth daily at 12 noon. 04/30/14   Tommie Samsook, Jayce G, DO    Family History History reviewed. No pertinent family history.  Social History Social History   Tobacco Use  . Smoking status: Never Smoker  . Smokeless tobacco: Never Used  Substance Use Topics  . Alcohol use: No  . Drug use: No     Allergies   Mushroom extract complex   Review of Systems Review of Systems  All other systems reviewed and are negative.    Physical Exam Triage Vital Signs ED  Triage Vitals  Enc Vitals Group     BP 09/15/17 1920 133/84     Pulse Rate 09/15/17 1920 89     Resp 09/15/17 1920 18     Temp 09/15/17 1920 98.5 F (36.9 C)     Temp src --      SpO2 09/15/17 1920 99 %     Weight --      Height --      Head Circumference --      Peak Flow --      Pain Score 09/15/17 1919 6     Pain Loc --      Pain Edu? --      Excl. in GC? --    No data found.  Updated Vital Signs BP 133/84   Pulse 89   Temp 98.5 F (36.9 C)   Resp 18   LMP 08/25/2017   SpO2 99%   Visual Acuity Right Eye Distance:   Left Eye Distance:   Bilateral Distance:    Right Eye Near:   Left Eye Near:    Bilateral Near:     Physical Exam  Constitutional: She appears well-developed and well-nourished. No distress.  Musculoskeletal:  No swelling, limited flexion due to pain,  pain to palpation along the lower patella pole, no knee effusion  Skin: Skin is warm and dry. She is not diaphoretic.  Psychiatric: Her behavior is normal.  Nursing note and vitals reviewed.    UC Treatments / Results  Labs (all labs ordered are listed, but only abnormal results are displayed) Labs Reviewed - No data to display  EKG  EKG Interpretation None       Radiology No results found.  Procedures Procedures (including critical care time)  Medications Ordered in UC Medications - No data to display   Initial Impression / Assessment and Plan / UC Course  I have reviewed the triage vital signs and the nursing notes.  Pertinent labs & imaging results that were available during my care of the patient were reviewed by me and considered in my medical decision making (see chart for details).     Non-displaced probable fracture. Will treat with immobilizer in extension, and non weight bear until f/u with Orthopedics for full evaluation and recommendation. Ice and elevate over the next few days. FU as needed.   Final Clinical Impressions(s) / UC Diagnoses   Final diagnoses:  None     ED Discharge Orders    None       Controlled Substance Prescriptions [place] Controlled Substance Registry consulted? Not Applicable   Sharin Mons 09/15/17 2013

## 2017-09-15 NOTE — ED Triage Notes (Signed)
Pt here for fall leaving work today. Falling on right knee. Painful to ambulate.

## 2017-09-15 NOTE — Discharge Instructions (Signed)
Suggest you get crutches from a drugstore as this will be cheaper for you. No weight bear. Wear the leg brace in extension and leave on in bed. FU with the Orthopedic on Monday or Tuesday for further instructions regarding weight bearing and ROM.

## 2017-09-17 ENCOUNTER — Encounter (INDEPENDENT_AMBULATORY_CARE_PROVIDER_SITE_OTHER): Payer: Self-pay | Admitting: Physician Assistant

## 2017-09-17 ENCOUNTER — Ambulatory Visit (INDEPENDENT_AMBULATORY_CARE_PROVIDER_SITE_OTHER): Payer: Self-pay | Admitting: Physician Assistant

## 2017-09-17 VITALS — Ht 59.0 in | Wt 164.0 lb

## 2017-09-17 DIAGNOSIS — M25561 Pain in right knee: Secondary | ICD-10-CM

## 2017-09-17 NOTE — Progress Notes (Signed)
Office Visit Note   Patient: Brittney OlszewskiGloria Vargas Cummings           Date of Birth: 10/04/1995           MRN: [id number] Visit Date: 09/17/2017              Requested by: Freddrick MarchAmin, Yashika, MD [address] [place], [place] [address] PCP: Freddrick MarchAmin, Yashika, MD   Assessment & Plan: Visit Diagnoses:  1. Acute pain of right knee     Plan: I explained to Brittney Cummings that that this could be just a right patella bony contusion versus a nondisplaced inferior pole patella fracture or a patella tendon injury.  Did discuss with her that the fact that she is able to do a slight straight leg raise but with his significant pain to the patella tendon at least is intact.  Recommend that she remained in the knee immobilizer and even wear this while sleeping.  She is not to bend the knee.  She can be weightbearing as tolerated on the leg in the knee immobilizer.  Ice elevation encouraged.  She will continue her Naprosyn.  Written her out of work for the next 2 weeks.  We will have her follow-up in 2 weeks at that time we will obtain lateral view of her right knee.  Questions encouraged and answered.  Follow-Up Instructions: Return in about 2 weeks (around 10/01/2017).   Orders:  No orders of the defined types were placed in this encounter.  No orders of the defined types were placed in this encounter.     Procedures: No procedures performed   Clinical Data: No additional findings.   Subjective: Chief Complaint  Patient presents with  . Right Knee - Fracture    HPI Brittney Cummings is a 22 year old female worsening for the first time for a right knee injury.  She reports that while leaving work on 09/15/2017 she was walking down the hill towards her car and fell she was able to push herself up but fell again falling onto her knee and onto the concrete.  She was then seen in the [facility] where x-rays were obtained.  These showed a possible nondisplaced inferior pole patella fracture.  No other fractures  identified.  Otherwise knee is well located.  Personally reviewed these films.  She is placed in a knee immobilizer which she is been wearing even while sleeping.  She has been using crutches to ambulate.  She is taking naproxen for pain. Review of Systems   Objective: Vital Signs: Ht 4\' 11"  (1.499 m)   Wt 164 lb (74.4 kg)   LMP 08/25/2017   BMI 33.12 kg/m   Physical Exam  Constitutional: She is oriented to person, place, and time. She appears well-developed and well-nourished. No distress.  Pulmonary/Chest: Effort normal.  Neurological: She is alert and oriented to person, place, and time.  Skin: She is not diaphoretic.  Psychiatric: She has a normal mood and affect.    Ortho Exam Right knee she has tenderness at the inferior pole of the patella.  She is able to do a straight leg raise but has considerable amount of pain with this.  Slight effusion in the knee.  No ecchymosis erythema.  There is a slight abrasion at the inferior pole of patella region.  No signs of infection.  No instability valgus varus stressing.  She has no tenderness along medial lateral joint line.  Right calf supple nontender. Specialty Comments:  No specialty comments available.  Imaging: No results found.   PMFS History: Patient Active Problem List   Diagnosis Date Noted  . Fetal arrhythmia affecting pregnancy, antepartum 04/28/2014  . Active labor 04/28/2014  . Supervision of other normal pregnancy 11/17/2013   Past Medical History:  Diagnosis Date  . Medical history non-contributory     No family history on file.  No past surgical history on file. Social History   Occupational History  . Not on file  Tobacco Use  . Smoking status: Never Smoker  . Smokeless tobacco: Never Used  Substance and Sexual Activity  . Alcohol use: No  . Drug use: No  . Sexual activity: Yes    Birth control/protection: Condom, None    Comment: preg

## 2017-10-01 ENCOUNTER — Ambulatory Visit (INDEPENDENT_AMBULATORY_CARE_PROVIDER_SITE_OTHER): Payer: Self-pay | Admitting: Physician Assistant

## 2017-10-01 ENCOUNTER — Encounter (INDEPENDENT_AMBULATORY_CARE_PROVIDER_SITE_OTHER): Payer: Self-pay | Admitting: Physician Assistant

## 2017-10-01 ENCOUNTER — Ambulatory Visit (INDEPENDENT_AMBULATORY_CARE_PROVIDER_SITE_OTHER): Payer: Self-pay

## 2017-10-01 DIAGNOSIS — M25561 Pain in right knee: Secondary | ICD-10-CM

## 2017-10-01 MED ORDER — METHOCARBAMOL 500 MG PO TABS: 500.0000 mg | ORAL_TABLET | Freq: Every day | ORAL | 1 refills | Status: DC

## 2017-10-01 NOTE — Progress Notes (Signed)
Office Visit Note   Patient: Brittney Cummings           Date of Birth: 11/16/1995           MRN: [id number] Visit Date: 10/01/2017              Requested by: Freddrick MarchAmin, Yashika, MD [address] [place], [place] [address] PCP: Freddrick MarchAmin, Yashika, MD   Assessment & Plan: Visit Diagnoses:  1. Right knee pain, unspecified chronicity     Plan: We will obtain an MRI of her right knee to rule out internal derangement.  Have her follow-up after the MRI to go over results and discuss further treatment.  She is given a prescription for a Bledsoe hinged brace locked at 0-90 degrees.  Weightbearing as tolerated on the right leg.  Follow-Up Instructions: Return for after MRI.   Orders:  Orders Placed This Encounter  Procedures  . XR Knee 1-2 Views Right  . MR Knee Right w/o contrast   Meds ordered this encounter  Medications  . methocarbamol (ROBAXIN) 500 MG tablet    Sig: Take 1 tablet (500 mg total) by mouth at bedtime.    Dispense:  30 tablet    Refill:  1      Procedures: No procedures performed   Clinical Data: No additional findings.   Subjective: Chief Complaint  Patient presents with  . Right Knee - Pain    HPI [name] returns today follow-up of her right knee.  She continues to have pain in the knee states that the cramping wakes her up at night.  Is been unable to bear full weight on the knee in the knee brace.  She is taking naproxen for pain periodically.  States she is slightly improved from last visit. Review of Systems No fevers, chills, calf pain or chest pain.  Objective: Vital Signs: There were no vitals taken for this visit.  Physical Exam  Constitutional: She is oriented to person, place, and time. She appears well-developed and well-nourished. No distress.  Cardiovascular: Intact distal pulses.  Pulmonary/Chest: Effort normal.  Neurological: She is alert and oriented to person, place, and time.  Skin: She is not diaphoretic.    Psychiatric: She has a normal mood and affect.    Ortho Exam Right knee no effusion abnormal warmth erythema.  No instability valgus varus stressing.  She has tenderness over the origin of the patella tibial tendon.  Tendon feels to be intact.  She is able to do a straight leg raise.  She has tenderness over the medial joint line of the knee.  Positive McMurray's.  Anterior drawer is negative. Specialty Comments:  No specialty comments available.  Imaging: Xr Knee 1-2 Views Right  Result Date: 10/01/2017 AP lateral views right knee: No acute fracture no bony abnormalities.  No evidence of any consolidation of the patella fracture.  Otherwise knee is well maintained.  No dislocation subluxation    PMFS History: Patient Active Problem List   Diagnosis Date Noted  . Fetal arrhythmia affecting pregnancy, antepartum 04/28/2014  . Active labor 04/28/2014  . Supervision of other normal pregnancy 11/17/2013   Past Medical History:  Diagnosis Date  . Medical history non-contributory     History reviewed. No pertinent family history.  History reviewed. No pertinent surgical history. Social History   Occupational History  . Not on file  Tobacco Use  . Smoking status: Never Smoker  . Smokeless tobacco: Never Used  Substance and Sexual Activity  .  Alcohol use: No  . Drug use: No  . Sexual activity: Yes    Birth control/protection: Condom, None    Comment: preg

## 2017-10-08 ENCOUNTER — Telehealth (INDEPENDENT_AMBULATORY_CARE_PROVIDER_SITE_OTHER): Payer: Self-pay | Admitting: Physician Assistant

## 2017-10-08 NOTE — Telephone Encounter (Signed)
Patient was told to go to [employer] to get a knee brace, she said its out of her price range and is wondering if theres some where else you could give her to get a brace that is cheaper. Please advise # [phone]

## 2017-10-08 NOTE — Telephone Encounter (Signed)
No cheaper place . Just continue knee immobilizer.

## 2017-10-08 NOTE — Telephone Encounter (Signed)
Patient aware of the below message  

## 2017-10-08 NOTE — Telephone Encounter (Signed)
See below

## 2017-10-15 ENCOUNTER — Ambulatory Visit
Admission: RE | Admit: 2017-10-15 | Discharge: 2017-10-15 | Disposition: A | Discharge status: Home / Self Care | Payer: No Typology Code available for payment source | Source: Ambulatory Visit | Attending: Physician Assistant | Admitting: Physician Assistant

## 2017-10-15 ENCOUNTER — Ambulatory Visit (INDEPENDENT_AMBULATORY_CARE_PROVIDER_SITE_OTHER): Payer: Self-pay | Admitting: Physician Assistant

## 2017-10-15 DIAGNOSIS — M25561 Pain in right knee: Secondary | ICD-10-CM

## 2017-10-22 ENCOUNTER — Encounter (INDEPENDENT_AMBULATORY_CARE_PROVIDER_SITE_OTHER): Payer: Self-pay | Admitting: Physician Assistant

## 2017-10-22 ENCOUNTER — Ambulatory Visit (INDEPENDENT_AMBULATORY_CARE_PROVIDER_SITE_OTHER): Payer: Self-pay | Admitting: Physician Assistant

## 2017-10-22 DIAGNOSIS — M25561 Pain in right knee: Secondary | ICD-10-CM

## 2017-10-22 NOTE — Progress Notes (Signed)
   Office Visit Note   Patient: Brittney OlszewskiGloria Vargas Cummings           Date of Birth: 02/26/1996           MRN: [id number] Visit Date: 10/22/2017              Requested by: Freddrick MarchAmin, Yashika, MD [address] [place], [place] [address] PCP: Freddrick MarchAmin, Yashika, MD   Assessment & Plan: Visit Diagnoses:  1. Right knee pain, unspecified chronicity     Plan: She will work on gentle range of motion right knee and quad strengthening.  She is to discontinue the knee immobilizer at this point in time.  Continue her naproxen, ice and pullover knee sleeve as needed.  Out of work until follow-up in 1 month  Follow-Up Instructions: Return in about 4 weeks (around 11/19/2017).   Orders:  No orders of the defined types were placed in this encounter.  No orders of the defined types were placed in this encounter.     Procedures: No procedures performed   Clinical Data: No additional findings.   Subjective: Chief Complaint  Patient presents with  . Right Knee - Follow-up, Pain    HPI Brittney Cummings returns today for follow-up of her right knee pain status post MRI.  She continues to have pain in her right knee and especially when going up and down stairs.  Also if she ambulates for prolonged period of time.  She is now 5 weeks 2 days status post injury which she sustained while leaving work.  MRI is reviewed with her today and shows edema at the inferior pole of the patella consistent with a nondisplaced fracture.  No other internal derangement of the right knee. Review of Systems Please see HPI otherwise review of systems negative  Objective: Vital Signs: There were no vitals taken for this visit.  Physical Exam  Constitutional: She is oriented to person, place, and time. She appears well-developed and well-nourished. No distress.  Pulmonary/Chest: Effort normal.  Neurological: She is alert and oriented to person, place, and time.  Skin: She is not diaphoretic.    Ortho Exam Right knee full  extension able to do a straight leg raise.  Flexion of the knee to at least 90 degrees.  No tenderness along medial lateral joint line.  Tenderness at the inferior pole of the patella.  There is no rashes skin lesions ecchymosis.  Specialty Comments:  No specialty comments available.  Imaging: No results found.   PMFS History: Patient Active Problem List   Diagnosis Date Noted  . Fetal arrhythmia affecting pregnancy, antepartum 04/28/2014  . Active labor 04/28/2014  . Supervision of other normal pregnancy 11/17/2013   Past Medical History:  Diagnosis Date  . Medical history non-contributory     History reviewed. No pertinent family history.  History reviewed. No pertinent surgical history. Social History   Occupational History  . Not on file  Tobacco Use  . Smoking status: Never Smoker  . Smokeless tobacco: Never Used  Substance and Sexual Activity  . Alcohol use: No  . Drug use: No  . Sexual activity: Yes    Birth control/protection: Condom, None    Comment: preg

## 2017-11-21 ENCOUNTER — Encounter (INDEPENDENT_AMBULATORY_CARE_PROVIDER_SITE_OTHER): Payer: Self-pay | Admitting: Orthopaedic Surgery

## 2017-11-21 ENCOUNTER — Ambulatory Visit (INDEPENDENT_AMBULATORY_CARE_PROVIDER_SITE_OTHER): Payer: Self-pay | Admitting: Orthopaedic Surgery

## 2017-11-21 DIAGNOSIS — M25561 Pain in right knee: Secondary | ICD-10-CM

## 2017-11-21 NOTE — Progress Notes (Signed)
The patient is now about 9-10 weeks into an injury to her significant edema in the inferior pole of patella.  We will treat her knee immobilizer.  We had her stop this at her last visit.  Right knee from a fall.  This happened at the end of January.  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  She does have still some swelling in her  right knee.  On exam her left knee hyperextends but her right knee does not.  There is still slight effusion on the right knee and pain with full extension.  Her extensor mechanism is intact and her quad strength is improving.  At this point I gave her reassurance that she should continue to get better with time.  It can take 4-6 months to feel completely better.  She will still avoid lunges and deep knee bends.  All questions concerns were answered and addressed.  At this point she will follow-up as needed.

## 2018-01-25 ENCOUNTER — Encounter: Payer: Self-pay | Admitting: Family Medicine

## 2018-01-25 ENCOUNTER — Other Ambulatory Visit (HOSPITAL_COMMUNITY)
Admission: RE | Admit: 2018-01-25 | Discharge: 2018-01-25 | Disposition: A | Discharge status: Home / Self Care | Payer: No Typology Code available for payment source | Source: Ambulatory Visit | Attending: Family Medicine | Admitting: Family Medicine

## 2018-01-25 ENCOUNTER — Ambulatory Visit (INDEPENDENT_AMBULATORY_CARE_PROVIDER_SITE_OTHER): Payer: No Typology Code available for payment source | Admitting: Family Medicine

## 2018-01-25 ENCOUNTER — Other Ambulatory Visit: Payer: Self-pay

## 2018-01-25 VITALS — BP 110/80 | HR 81 | Temp 98.3°F | Ht 59.0 in | Wt 175.0 lb

## 2018-01-25 DIAGNOSIS — Z124 Encounter for screening for malignant neoplasm of cervix: Secondary | ICD-10-CM | POA: Diagnosis not present

## 2018-01-25 DIAGNOSIS — M25561 Pain in right knee: Secondary | ICD-10-CM | POA: Diagnosis not present

## 2018-01-25 NOTE — Progress Notes (Signed)
Subjective:   Patient ID: Brittney Cummings    DOB: 09/30/1995, 22 y.o. female   MRN: [id number]  CC: new patient visit, preventative annual, right knee pain  HPI: Brittney Cummings is a 22 y.o. female who presents to clinic today for the following issue.  Patient was previously at our clinic but has not been seen in about 3 to 4 years.  She presents today to establish care with me as her new PCP.  PMHx: none Social Hx: never smoker, social alcohol use, no illicit drug use  Surg hx: none  FHx: mother-HTN, father- HTN, HLD, sister-HTN and prediabetes  Medications: none  Allergies: mushroom extract     R knee pain Patient states she fell January 2019.  This is not a new problem.  She had [facility] at that time.  Was found to have a nondisplaced fracture of inferior pole of her patellar on MRI.  She was told to return in 1 month if she continued to have swelling.  She states she now needs a referral to see them again due to new insurance.  Has been using ice packs, heat, resting and elevating it.  She avoids stairs sometimes due to sharp shooting pain which resolves on its own within seconds.  Pain is exacerbated by walking a lot or from going from sitting to standing position.  She has been taking naproxen as needed and this helps her.    Health Maintenance -Due for Pap smear  ROS: Fever, chills, nausea, vomiting.  No numbness, tingling, weakness.  Medications reviewed. Objective:   BP 110/80   Pulse 81   Temp 98.3 F (36.8 C) (Oral)   Ht 4\' 11"  (1.499 m)   Wt 175 lb (79.4 kg)   LMP 01/12/2018   SpO2 98%   BMI 35.35 kg/m  Vitals and nursing note reviewed.  General: 22 year old female, NAD  HEENT: NCAT, EOMI, PERRLA, MMM, oropharynx clear Neck: Full, nontender, no LAD, normal range of motion CV: RRR no MRG, 2+ pedal pulses Lungs: CTAB, normal effort on room air Abdomen: Soft, NTND, positive bowel sounds  MSK-- Right knee: Normal to inspection  with no erythema or effusion or obvious bony abnormalities.  ROM is normal with flexion and extension.  Mild tenderness noted over inferior pole of patella.  No medial or lateral joint line tenderness.   Ligaments with solid consistent endpoints including ACL, PCL, LCL, MCL. Negative Mcmurray's and provocative meniscal tests. Patellar and quadriceps tendons unremarkable. Hamstring and quadriceps strength is normal.  Skin: Warm, dry, no rash Extremities: warm and well perfused, normal tone  Assessment & Plan:   Right knee pain Following injury that occurred end of January - MRI revealed fracture of inferior pole of patella.  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  No effusion noted on exam today.  She appears to have some tenderness over inferior pole.  Normal ROM and good quadriceps strength.  Reassurance provided to pt given her exam is largely normal.  She expresses concern about ongoing pain and requests new referral for her insurance to be seen again by Orthopedics.     Health maintenance: Pap smear performed today  Orders Placed This Encounter  Procedures  . Ambulatory referral to Orthopedic Surgery    Referral Priority:   Routine    Referral Type:   Surgical    Referral Reason:   Specialty Services Required  Requested Specialty:   Orthopedic Surgery    Number of Visits Requested:   1    Freddrick March, MD [facility], PGY-2 01/27/2018 1:05 PM

## 2018-01-25 NOTE — Patient Instructions (Addendum)
It was nice meeting you today!  You were seen in clinic to establish care with me as your new PCP.  We discussed your medical conditions, family history and medications.  We also did a physical exam and Pap smear today and I will call you once I have the results of this. I have placed a referral to Orthopedics for your right knee pain and you can expect a call within a week or so to schedule.  Please call clinic if you have any questions.  Be well, Freddrick MarchYashika Garett Tetzloff MD

## 2018-01-27 DIAGNOSIS — M25561 Pain in right knee: Secondary | ICD-10-CM | POA: Insufficient documentation

## 2018-01-27 NOTE — Assessment & Plan Note (Addendum)
Following injury that occurred end of January - MRI revealed fracture of inferior pole of patella.  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  No effusion noted on exam today.  She appears to have some tenderness over inferior pole.  Normal ROM and good quadriceps strength.  Reassurance provided to pt given her exam is largely normal.  She expresses concern about ongoing pain and requests new referral for her insurance to be seen again by Orthopedics.

## 2018-01-31 ENCOUNTER — Encounter: Payer: No Typology Code available for payment source | Admitting: Family Medicine

## 2018-02-01 LAB — CYTOLOGY - PAP
Chlamydia: NEGATIVE
Diagnosis: NEGATIVE
Neisseria Gonorrhea: NEGATIVE

## 2018-02-13 ENCOUNTER — Encounter: Payer: No Typology Code available for payment source | Admitting: Family Medicine

## 2018-02-14 ENCOUNTER — Encounter: Payer: No Typology Code available for payment source | Admitting: Family Medicine

## 2018-02-15 ENCOUNTER — Ambulatory Visit (INDEPENDENT_AMBULATORY_CARE_PROVIDER_SITE_OTHER): Payer: No Typology Code available for payment source | Admitting: Family Medicine

## 2018-02-15 ENCOUNTER — Encounter: Payer: Self-pay | Admitting: Family Medicine

## 2018-02-15 ENCOUNTER — Other Ambulatory Visit: Payer: Self-pay

## 2018-02-15 VITALS — BP 126/82 | HR 69 | Temp 98.5°F | Ht 59.0 in | Wt 181.0 lb

## 2018-02-15 DIAGNOSIS — Z0000 Encounter for general adult medical examination without abnormal findings: Secondary | ICD-10-CM

## 2018-02-15 NOTE — Patient Instructions (Addendum)
It was a pleasure to see you today! Thank you for choosing [facility] for your primary care. Brittney OlszewskiGloria Vargas Cummings was seen for physical.   Our plans for today were:  You can use over the counter prenatal vitamins.   Check your blood pressure again on another day and let us know how it is.   Try to increase your vegetable intake and continue to be active to maintain a healthy weight. Drinking lots of water is always great!   Best,  Dr. Chanetta Marshallimberlake

## 2018-02-15 NOTE — Progress Notes (Signed)
   CC: physical  HPI  BP up bc her husband got into a car wreck and she is stressed about this.   Using condoms. Does want another baby, but not right away.   ROS: Denies CP, SOB, abdominal pain, dysuria, changes in BMs.   CC, SH/smoking status, and VS noted  Objective: BP 126/82   Pulse 69   Temp 98.5 F (36.9 C) (Oral)   Ht 4\' 11"  (1.499 m)   Wt 181 lb (82.1 kg)   SpO2 98%   BMI 36.56 kg/m  Gen: NAD, alert, cooperative, and pleasant. HEENT: NCAT, EOMI, PERRL CV: RRR, no murmur Resp: CTAB, no wheezes, non-labored Abd: SNTND, BS present, no guarding or organomegaly Ext: No edema, warm Neuro: Alert and oriented, Speech clear, No gross deficits  Assessment and plan:  Wellness exam - patient up to date on screenings. BMI in obesity category, encouraged to increase exercise and vegetable intake, increase water. PAP performed at last visit.   Contraception - desires another pregnancy soon. Counseled to avoid teratogens and continue PNV.   Loni MuseKate Timberlake, MD, PGY2 02/15/2018 4:41 PM

## 2018-02-20 ENCOUNTER — Ambulatory Visit (INDEPENDENT_AMBULATORY_CARE_PROVIDER_SITE_OTHER): Payer: No Typology Code available for payment source | Admitting: Physician Assistant

## 2018-02-20 ENCOUNTER — Encounter (INDEPENDENT_AMBULATORY_CARE_PROVIDER_SITE_OTHER): Payer: Self-pay | Admitting: Physician Assistant

## 2018-02-20 DIAGNOSIS — M25561 Pain in right knee: Secondary | ICD-10-CM | POA: Diagnosis not present

## 2018-02-20 DIAGNOSIS — G8929 Other chronic pain: Secondary | ICD-10-CM

## 2018-02-20 MED ORDER — DICLOFENAC SODIUM 1 % TD GEL: 4.0000 g | Freq: Four times a day (QID) | TRANSDERMAL | 0 refills | Status: DC

## 2018-02-20 MED ORDER — METHYLPREDNISOLONE 4 MG PO TABS: ORAL_TABLET | ORAL | 0 refills | Status: DC

## 2018-02-20 MED FILL — METHYLPREDNISOLONE 4 MG TAB: 4 | 6 days supply | Qty: 21 | Fill #0

## 2018-02-20 NOTE — Progress Notes (Signed)
HPI [name] returns today almost 6 months status post right knee inferior pole patella nondisplaced fracture.  She states she is now having a burning throbbing pain about the right knee.  She said no new injury.  She is taking some over-the-counter NSAIDs with some relief.  She is having no mechanical symptoms of the knee at this point.  Physical exam: General well-developed well-nourished female no acute distress made affect appropriate.  Psych alert and oriented x3 Right knee she has full range of motion.  Deep flexion causes some pain in the anterior aspect the knee.  She has tenderness along the medial joint line maximal tenderness over the patella tibial tendon.  No effusion abnormal warmth erythema ecchymosis of the knee.  Negative McMurray's bilaterally.  No instability valgus varus stressing of either knee.  She is able do straight leg raise bilaterally.  Impression right knee patella tibial tendinitis  Plan: We will place her on a Medrol Dosepak no NSAIDs while on the Medrol Dosepak.  She is denies any pregnancy or nursing at this point time.  She will then go back on her NSAIDs since needed.  And start Voltaren gel 4 g 4 times daily to the patella tibial tendon region.  We will send her to physical therapy for strengthening modalities and home exercise program like to see her back in 4 weeks check her progress or lack of.

## 2018-02-20 NOTE — Addendum Note (Signed)
Addended by: Rip HarbourKING, TERRI M on: 02/20/2018 05:14 PM   Modules accepted: Orders

## 2018-02-25 ENCOUNTER — Encounter: Payer: No Typology Code available for payment source | Admitting: Family Medicine

## 2018-02-25 MED FILL — DICLOFENAC SODIUM 1% GEL: 1 | 6 days supply | Qty: 100 | Fill #0

## 2018-03-06 ENCOUNTER — Ambulatory Visit: Payer: No Typology Code available for payment source | Attending: Physician Assistant | Admitting: Physical Therapy

## 2018-03-06 ENCOUNTER — Encounter: Payer: Self-pay | Admitting: Physical Therapy

## 2018-03-06 DIAGNOSIS — M25561 Pain in right knee: Secondary | ICD-10-CM

## 2018-03-06 DIAGNOSIS — R262 Difficulty in walking, not elsewhere classified: Secondary | ICD-10-CM

## 2018-03-06 DIAGNOSIS — M6281 Muscle weakness (generalized): Secondary | ICD-10-CM | POA: Diagnosis present

## 2018-03-06 NOTE — Therapy (Addendum)
[place], [place], [address] Phone: [phone]   Fax:  [phone]  Physical Therapy Evaluation Discharge  Patient Details  Name: Brittney Cummings MRN: [id number] Date of Birth: 1995/09/29 Referring Provider: Erskine Emery, PA   Encounter Date: 03/06/2018  PT End of Session - 03/06/18 1244    Visit Number  1    Number of Visits  13    PT Start Time  7793    PT Stop Time  1315    PT Time Calculation (min)  40 min    Activity Tolerance  Patient tolerated treatment well    Behavior During Therapy  [facility] for tasks assessed/performed       Past Medical History:  Diagnosis Date  . Medical history non-contributory     History reviewed. No pertinent surgical history.  There were no vitals filed for this visit.   Subjective Assessment - 03/06/18 1239    Subjective  Pt arriving to therapy reporting fall on 09/15/17 where she fx her right patella. Pt reported she was in an [place] for 8 weeks. Pt reporting when she was using the brace she was cramping and the MD suggested she stop wearing the brace at night.     Limitations  House hold activities;Walking;Standing    Currently in Pain?  Yes    Pain Score  5     Pain Location  Knee    Pain Orientation  Right    Pain Descriptors / Indicators  Aching;Throbbing    Pain Type  Acute pain    Pain Onset  More than a month ago    Pain Frequency  Constant    Aggravating Factors   worse in afternoons, standing and walking and sleeping at night is difficult    Pain Relieving Factors  changing positions         [facility] PT Assessment - 03/06/18 0001      Assessment   Medical Diagnosis  R knee pain    Referring Provider  Erskine Emery, PA    Hand Dominance  Right    Prior Therapy  no      Precautions   Precautions  None      Restrictions   Weight Bearing Restrictions  No      Balance Screen   Has the patient fallen in the past 6 months  Yes     How many times?  1    Has the patient had a decrease in activity level because of a fear of falling?   Yes    Is the patient reluctant to leave their home because of a fear of falling?   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      Cognition   Overall Cognitive Status  Within Functional Limits for tasks assessed      Posture/Postural Control   Posture/Postural Control  No significant limitations      ROM / Strength   AROM / PROM / Strength  AROM;Strength      AROM   Overall AROM Comments  L genu recurvatum    AROM Assessment Site  Knee  Right/Left Knee  Right;Left    Right Knee Extension  0    Right Knee Flexion  78    Left Knee Extension  -4 hyperextension    Left Knee Flexion  132      Strength   Overall Strength Comments  limited by pain    Strength Assessment Site  Knee    Right/Left Knee  Right    Right Knee Flexion  2+/5    Right Knee Extension  2+/5      Palpation   Palpation comment  limited R patella mobs all directions      Special Tests   Other special tests  unable to perfrom a SLR       Ambulation/Gait   Assistive device  None    Gait Pattern  Decreased stance time - right;Decreased weight shift to right                Objective measurements completed on examination: See above findings.              PT Education - 03/06/18 1243    Education Details  HEP    Person(s) Educated  Patient    Methods  Explanation;Demonstration    Comprehension  Verbalized understanding;Returned demonstration       PT Short Term Goals - 03/06/18 1319      PT SHORT TERM GOAL #1   Title  Pt will be indpendent in her HEP    Time  3    Period  Weeks    Status  New    Target Date  03/27/18       PT SHORT TERM GOAL #2   Title  Pt will be able to walk with normalized gait pattern with equalized step lenght and weight shifting to the right side.     Time  3    Period  Weeks    Status  New    Target Date  03/27/18        PT Long Term Goals - 03/06/18 1320      PT LONG TERM GOAL #1   Title  Pt will be improve her FOTO score from 76% limitation to </= 45% limitation.     Time  6    Period  Weeks    Status  New    Target Date  04/17/18      PT LONG TERM GOAL #2   Title  Pt will be able to amb community distances reporting pain </= 2/10.     Time  6    Period  Weeks    Status  New    Target Date  04/17/18      PT LONG TERM GOAL #3   Title  Pt will improve her R knee flexion to >/= 120 degrees.     Time  6    Period  Weeks    Status  New    Target Date  04/17/18      PT LONG TERM GOAL #4   Title  Pt will improve her R knee strength to >/= 4/5 to improve functional mobility     Time  6    Period  Weeks    Status  New    Target Date  04/17/18             Plan - 03/06/18 1324    Clinical Impression Statement  Pt arrving to therapy for PT evaluation for R knee pain. Pt s/p R patella fx  on 09/15/17 due to fall when leaving work from her last job. Pt was instructed by MD to wear a knee immoiblizer for 8 weeks where she reported increased cramping at night. Pt since has been amb with no device with decreased weigth shifting to the right and decreased step length. Pt with limited ROM from 0-78 degrees on her R compared to -2-134 degrees on her left. Pt with pain in all movements and limited patella mobility.  Weakness noted of +2/5 in R knee flexion and extension. Skilled PT needed to address pt's impairments with below interventions.     Clinical Presentation  Stable    Clinical Decision Making  Low    Rehab Potential  Excellent    PT Frequency  2x / week    PT Duration  6 weeks    PT Treatment/Interventions  ADLs/Self Care Home Management;Cryotherapy;Electrical  Stimulation;Iontophoresis 10m/ml Dexamethasone;Ultrasound;Gait training;Stair training;Functional mobility training;Therapeutic activities;Therapeutic exercise;Manual techniques;Patient/family education;Balance training;Neuromuscular re-education;Passive range of motion;Taping;Vasopneumatic Device    PT Next Visit Plan  knee ROM, gentle strengthening, manual and modalities as tolerated/needed    PT Home Exercise Plan  Quad sets, heel slides (sitting and supine)    Consulted and Agree with Plan of Care  Patient       Patient will benefit from skilled therapeutic intervention in order to improve the following deficits and impairments:  Pain, Decreased activity tolerance, Decreased balance, Decreased mobility, Difficulty walking, Decreased range of motion, Decreased strength  Visit Diagnosis: Acute pain of right knee  Difficulty in walking, not elsewhere classified  Muscle weakness (generalized)     Problem List Patient Active Problem List   Diagnosis Date Noted  . Right knee pain 01/27/2018  . Fetal arrhythmia affecting pregnancy, antepartum 04/28/2014   PHYSICAL THERAPY DISCHARGE SUMMARY  Visits from Start of Care: 1 visit  Current functional level related to goals / functional outcomes: No goals met, pt only came for evaluation and no treatment visits were scheduled   Remaining deficits: All deficits remain   Education / Equipment: beginning HEP was issued  Plan: Patient agrees to discharge.  Patient goals were not met. Patient is being discharged due to not returning since the last visit.  ?????       JOretha Caprice MPT 03/06/2018, 1:30 PM  [facility] [place] [address]Phone: [phone]  Fax:  [phone] Name: GFotini LemusMRN: [id number] of Birth: 411-14-1997

## 2018-03-21 ENCOUNTER — Encounter (INDEPENDENT_AMBULATORY_CARE_PROVIDER_SITE_OTHER): Payer: Self-pay | Admitting: Orthopaedic Surgery

## 2018-03-21 ENCOUNTER — Ambulatory Visit (INDEPENDENT_AMBULATORY_CARE_PROVIDER_SITE_OTHER): Payer: No Typology Code available for payment source | Admitting: Orthopaedic Surgery

## 2018-03-21 DIAGNOSIS — G8929 Other chronic pain: Secondary | ICD-10-CM

## 2018-03-21 DIAGNOSIS — M25561 Pain in right knee: Secondary | ICD-10-CM | POA: Diagnosis not present

## 2018-03-21 NOTE — Progress Notes (Signed)
The patient is a pleasant 22 year old female who is now been dealing with a right knee injury and pain for at least 7 to 8 months now.  She originally fell back in January leaving her place of employment.  She had debilitating knee pain after that in February of this year and MRI was obtained and it did show a nondisplaced fracture of the inferior pole the patella with otherwise intact structures throughout the knee.  Since then she has been to physical therapy.  She is tried anti-inflammatories as well as activity modification.  Even time with resting her knee has not helped.  She still has debilitating pain with that knee and a burning sensation of her right knee.  She is frustrated due to the fact that the pain is still quite significant.  She cannot exercise or walk for long periods of time at all without her knee hurting.  On exam she is still walking with a limp.  She has a lot of guarding with that knee in any palpation try around the patella patella tendon she withdraws to pain and says it severe.  She can perform a straight leg raise but it certainly week doing so.  Notes from physical therapy attest to the fact that she is trying.  At this point an MRI is warranted of her right knee for comparison purposes with original MRI to make sure that there is no further tearing of the patella tendon since this was an inferior pole patella fracture.  We also want to make sure that she has not developed a nonunion.  An MRI is definitely more useful than a CT in this scenario based on the fact that this was only picked up on the MRI signal changes from the edema.  I would like the MRI to evaluate also the patella tendon.  At this point I want her to hold out of physical therapy while we wait this MRI.

## 2018-03-21 NOTE — Progress Notes (Signed)
MRI

## 2018-03-22 ENCOUNTER — Encounter

## 2018-03-27 ENCOUNTER — Encounter: Payer: No Typology Code available for payment source | Admitting: Physical Therapy

## 2018-03-29 ENCOUNTER — Encounter: Payer: No Typology Code available for payment source | Admitting: Physical Therapy

## 2018-03-30 ENCOUNTER — Ambulatory Visit
Admission: RE | Admit: 2018-03-30 | Discharge: 2018-03-30 | Disposition: A | Discharge status: Home / Self Care | Payer: No Typology Code available for payment source | Source: Ambulatory Visit | Attending: Orthopaedic Surgery | Admitting: Orthopaedic Surgery

## 2018-03-30 DIAGNOSIS — G8929 Other chronic pain: Secondary | ICD-10-CM

## 2018-03-30 DIAGNOSIS — M25561 Pain in right knee: Principal | ICD-10-CM

## 2018-04-01 ENCOUNTER — Encounter: Payer: No Typology Code available for payment source | Admitting: Physical Therapy

## 2018-04-03 ENCOUNTER — Encounter: Payer: No Typology Code available for payment source | Admitting: Physical Therapy

## 2018-04-08 ENCOUNTER — Encounter: Payer: No Typology Code available for payment source | Admitting: Physical Therapy

## 2018-04-09 ENCOUNTER — Encounter: Payer: Self-pay | Admitting: Family Medicine

## 2018-04-10 ENCOUNTER — Other Ambulatory Visit (INDEPENDENT_AMBULATORY_CARE_PROVIDER_SITE_OTHER): Payer: Self-pay

## 2018-04-10 ENCOUNTER — Encounter (INDEPENDENT_AMBULATORY_CARE_PROVIDER_SITE_OTHER): Payer: Self-pay | Admitting: Orthopaedic Surgery

## 2018-04-10 ENCOUNTER — Ambulatory Visit (INDEPENDENT_AMBULATORY_CARE_PROVIDER_SITE_OTHER): Payer: No Typology Code available for payment source | Admitting: Orthopaedic Surgery

## 2018-04-10 ENCOUNTER — Encounter: Payer: No Typology Code available for payment source | Admitting: Physical Therapy

## 2018-04-10 DIAGNOSIS — M25561 Pain in right knee: Secondary | ICD-10-CM | POA: Diagnosis not present

## 2018-04-10 DIAGNOSIS — G8929 Other chronic pain: Secondary | ICD-10-CM | POA: Diagnosis not present

## 2018-04-10 NOTE — Progress Notes (Signed)
Patient comes in today to go over an MRI of her right knee.  She is only 22 years old and is injured the inferior pole of her patella earlier this year.  An MRI did show some slight edema in the inferior pole the patella almost like a patella sleeve injury or nondisplaced fracture.  At her last visit she is still having weakness in her right knee as well as severe pain.  The MRI is reviewed with her and shows complete resolution of the edema in the inferior pole patella and essentially no internal derangement in her right knee at this point.  On exam she still is quite tender around the patella tendon itself and weak with that knee but can hold her knee fully extended.  She is a perfect candidate for extensive outpatient physical therapy at this standpoint given the fact that everything is healed completely with no internal derangement of the right knee.  Certainly therapy can work on quad strengthening and knee strengthening exercises.  Also would like them to work on any type of modalities that he can decrease her pain such as ultrasound/iontophoresis/phonophoresis/e-stim or anything else there disposal for getting her knee to feel better.  All question concerns were answered and addressed.  We will see her back in 4 weeks see how she is doing overall.

## 2018-04-30 ENCOUNTER — Encounter: Payer: Self-pay | Admitting: Physical Therapy

## 2018-04-30 ENCOUNTER — Ambulatory Visit: Payer: No Typology Code available for payment source | Attending: Orthopaedic Surgery | Admitting: Physical Therapy

## 2018-04-30 DIAGNOSIS — M25561 Pain in right knee: Secondary | ICD-10-CM | POA: Insufficient documentation

## 2018-04-30 DIAGNOSIS — M6281 Muscle weakness (generalized): Secondary | ICD-10-CM | POA: Diagnosis present

## 2018-04-30 NOTE — Therapy (Signed)
[facility] [address] [place], [place], [address] Phone: [phone]   Fax:  [phone]  Physical Therapy Evaluation/Discharge as one time visit  Patient Details  Name: Samiah Frenette MRN: [id number] Date of Birth: 10/15/1995 Referring Provider: Blackman   Encounter Date: 04/30/2018  PT End of Session - 04/30/18 1711    Visit Number  1    Number of Visits  1    Date for PT Re-Evaluation  05/28/18    PT Start Time  1650    PT Stop Time  1715    PT Time Calculation (min)  25 min    Activity Tolerance  Patient tolerated treatment well    Behavior During Therapy  WFL for tasks assessed/performed       Past Medical History:  Diagnosis Date  . Medical history non-contributory     History reviewed. No pertinent surgical history.  There were no vitals filed for this visit.   Subjective Assessment - 04/30/18 1705    Subjective  Pt arriving to therapy reporting fall on 09/15/17 where she fx her right patella. Pt reported she was in an immoblizer for 8 weeks. She had one PT visit and did not come back as she continued to have severe pain. She saw MD again who ordered another MRI showing everything had healed and then referred her back to PT. She now relays her knee is doing great pain is now 0 and no more than 2 this week and she has no difficulty with anything except minor difficulty with stairs.    Diagnostic tests  MRI shows complete resolution of the edema in the inferior pole patella and essentially no internal derangement in her right knee.     Patient Stated Goals  get new HEP and work on her knee at home    Currently in Pain?  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      Cognition   Overall Cognitive Status  Within Functional Limits for tasks assessed      Observation/Other Assessments   Focus on Therapeutic Outcomes (FOTO)   one visit only for HEP      ROM / Strength   AROM / PROM / Strength  AROM;Strength      AROM   AROM Assessment Site  Knee    Right/Left Knee  Right;Left    Right Knee Extension  0    Right Knee Flexion  114    Left Knee Flexion  125      Strength   Overall Strength Comments  hip strength 4/5 flexion and abd    Right Knee Flexion  5/5    Right Knee Extension  5/5      Flexibility   Soft Tissue Assessment /Muscle Length  --   tight quads     Ambulation/Gait   Gait Comments  WFL                Objective measurements   completed on examination: See above findings.      [facility] Adult PT Treatment/Exercise - 04/30/18 0001      Exercises   Exercises  Knee/Hip      Knee/Hip Exercises: Stretches   Quad Stretch  Right;2 reps;30 seconds      Knee/Hip Exercises: Seated   Sit to Sand  10 reps      Knee/Hip Exercises: Supine   Heel Slides  AAROM;2 sets;10 reps    Bridges  15 reps    Straight Leg Raises  15 reps      Knee/Hip Exercises: Sidelying   Hip ABduction  15 reps             PT Education - 04/30/18 1711    Education Details  advanced HEP given    Person(s) Educated  Patient    Methods  Explanation;Demonstration;Verbal cues;Handout    Comprehension  Verbalized understanding;Returned demonstration      LONG TERM GOAL: Pt will show good understanding of advanced HEP for home and have no further questions or concerns regarding Discharge.    Met today.        Plan - 04/30/18 1715    Clinical Impression Statement  Pt reports back to PT for Rt knee after recent MRI shows complete resolution of the edema in the inferior pole patella and essentially no internal  derangement in her right knee.  She now has no pain or difficulty with anything except minor with stairs. She does have mild hip weakness and some tightness in her quads limiting full knee ROM. She would benefit from a few PT sessions to address this but she feels confident she can progress on her own with HEP to no further PT will be needed. She was given updated and advanced HEP due to her progress over the last 2 weeks.     Clinical Presentation  Stable    Clinical Decision Making  Low    Rehab Potential  Excellent    PT Frequency  One time visit    PT Treatment/Interventions  ADLs/Self Care Home Management;Cryotherapy;Electrical Stimulation;Iontophoresis 4mg/ml Dexamethasone;Ultrasound;Gait training;Stair training;Functional mobility training;Therapeutic activities;Therapeutic exercise;Manual techniques;Patient/family education;Balance training;Neuromuscular re-education;Passive range of motion;Taping;Vasopneumatic Device    PT Next Visit Plan  D/C       Patient will benefit from skilled therapeutic intervention in order to improve the following deficits and impairments:  Pain, Decreased activity tolerance, Decreased balance, Decreased mobility, Difficulty walking, Decreased range of motion, Decreased strength  Visit Diagnosis: Acute pain of right knee  Muscle weakness (generalized)     Problem List Patient Active Problem List   Diagnosis Date Noted  . Right knee pain 01/27/2018  . Fetal arrhythmia affecting pregnancy, antepartum 04/28/2014    Brian R Nelson, PT, DPT 04/30/2018, 5:21 PM  [facility] [address] [place], [place], [address] Phone: [phone]   Fax:  [phone]  Name: John Snyders MRN: [id number] Date of Birth: 01/13/1996   PHYSICAL THERAPY DISCHARGE SUMMARY  Visits from Start of Care: 1  Current functional level related to goals / functional outcomes: See above   Remaining deficits: See  above   Education / Equipment: Advanced HEP given today Plan: Patient agrees to discharge.  Patient goals were met. Patient is being discharged due to being pleased with the current functional level.  ?????    Brian Nelson, PT, DPT 04/30/18 5:22 PM   

## 2018-05-09 ENCOUNTER — Ambulatory Visit (INDEPENDENT_AMBULATORY_CARE_PROVIDER_SITE_OTHER): Payer: No Typology Code available for payment source | Admitting: Orthopaedic Surgery

## 2018-05-09 ENCOUNTER — Encounter (INDEPENDENT_AMBULATORY_CARE_PROVIDER_SITE_OTHER): Payer: Self-pay | Admitting: Orthopaedic Surgery

## 2018-05-09 DIAGNOSIS — G8929 Other chronic pain: Secondary | ICD-10-CM

## 2018-05-09 DIAGNOSIS — M25561 Pain in right knee: Secondary | ICD-10-CM

## 2018-05-09 NOTE — Progress Notes (Signed)
Patient is following up after having physical therapy on her right knee.  She had had a hairline fracture through the inferior pole the patella that was seen on MRI.  Her last MRI shows that this is healed completely.  We then sent her to physical therapy now she says she is almost completely better.  On exam her right knee has full range of motion with no pain.  There is no pain palpating over the patella patella tendon.  At this point I do feel she is made an excellent recovery.  Follow-up will be as needed.

## 2018-06-24 ENCOUNTER — Encounter (INDEPENDENT_AMBULATORY_CARE_PROVIDER_SITE_OTHER): Payer: Self-pay | Admitting: Physician Assistant

## 2018-06-24 ENCOUNTER — Ambulatory Visit (INDEPENDENT_AMBULATORY_CARE_PROVIDER_SITE_OTHER): Payer: No Typology Code available for payment source | Admitting: Physician Assistant

## 2018-06-24 ENCOUNTER — Other Ambulatory Visit: Payer: Self-pay

## 2018-06-24 VITALS — BP 134/96 | HR 92 | Temp 98.2°F | Ht 60.0 in | Wt 180.4 lb

## 2018-06-24 DIAGNOSIS — J069 Acute upper respiratory infection, unspecified: Secondary | ICD-10-CM | POA: Diagnosis not present

## 2018-06-24 MED ORDER — PHENYLEPHRINE-DM-GG-APAP 5-10-200-325 MG/10ML PO LIQD: 20.0000 mL | ORAL | 0 refills | Status: AC

## 2018-06-24 MED ORDER — NAPROXEN 500 MG PO TABS: 500.0000 mg | ORAL_TABLET | Freq: Two times a day (BID) | ORAL | 0 refills | Status: DC

## 2018-06-24 MED ORDER — OSELTAMIVIR PHOSPHATE 75 MG PO CAPS: 75.0000 mg | ORAL_CAPSULE | Freq: Two times a day (BID) | ORAL | 0 refills | Status: AC

## 2018-06-24 MED FILL — NAPROXEN 500 MG TABLET: 500 | 15 days supply | Qty: 30 | Fill #0

## 2018-06-24 MED FILL — OSELTAMIVIR PHOSPHATE 75 MG: 75 | 5 days supply | Qty: 10 | Fill #0

## 2018-06-24 NOTE — Progress Notes (Signed)
Subjective:  Patient ID: Brittney Cummings, female    DOB: 1996-05-05  Age: 22 y.o. MRN: [id number]  CC: body aches, cough, sinus pressure  HPI Brittney Cummings is a 22 y.o. female with a medical history of right knee pain presents with two day history of cough, malaise, body aches, sinus pressure, ear discomfort, and nasal congestion. Has been exposed to her sick children who are exhibiting similar symptoms. Has taken Mucinex but does not know which one. Does not endorse any other symptoms or complaints. Reports having received flu vaccination last month.      Outpatient Medications Prior to Visit  Medication Sig Dispense Refill  . diclofenac sodium (VOLTAREN) 1 % GEL Apply 4 g topically 4 (four) times daily. Right knee (Patient not taking: Reported on 04/30/2018) 1 Tube 0  . methylPREDNISolone (MEDROL) 4 MG tablet Take as directed (Patient not taking: Reported on 03/06/2018) 21 tablet 0  . Prenatal Vit-Fe Fumarate-FA (PRENATAL MULTIVITAMIN) TABS tablet Take 1 tablet by mouth daily at 12 noon. 90 tablet 3   No facility-administered medications prior to visit.      ROS Review of Systems  Constitutional: Positive for malaise/fatigue. Negative for chills and fever.  HENT: Positive for congestion, ear pain, sinus pain and sore throat.   Eyes: Negative for blurred vision, pain, discharge and redness.  Respiratory: Positive for cough and shortness of breath.   Cardiovascular: Negative for chest pain and palpitations.  Gastrointestinal: Negative for abdominal pain and nausea.  Genitourinary: Negative for dysuria and hematuria.  Musculoskeletal: Positive for myalgias. Negative for back pain, joint pain and neck pain.  Skin: Negative for rash.  Neurological: Negative for tingling and headaches.  Psychiatric/Behavioral: Negative for depression. The patient is not nervous/anxious.     Objective:  BP (!) 134/96 (BP Location: Left Arm, Patient Position: Sitting, Cuff Size:  Normal)   Pulse 92   Temp 98.2 F (36.8 C) (Oral)   Ht 5' (1.524 m)   Wt 180 lb 6.4 oz (81.8 kg)   LMP 06/12/2018   SpO2 97%   Breastfeeding? No   BMI 35.23 kg/m   BP/Weight 06/24/2018 02/15/2018 01/25/2018  Systolic BP 134 126 110  Diastolic BP 96 82 80  Wt. (Lbs) 180.4 181 175  BMI 35.23 36.56 35.35      Physical Exam  Constitutional: She is oriented to person, place, and time.  Well developed, well nourished, NAD, polite  HENT:  Head: Normocephalic and atraumatic.  Mouth/Throat: No oropharyngeal exudate.  Turbinates erythematous with clear discharge. TMs mildly erythematous without bulging or retraction.   Eyes: Conjunctivae are normal. Right eye exhibits no discharge. Left eye exhibits no discharge. No scleral icterus.  Neck: Normal range of motion. Neck supple. No thyromegaly present.  Cardiovascular: Normal rate, regular rhythm and normal heart sounds.  Pulmonary/Chest: Effort normal and breath sounds normal. No respiratory distress. She has no wheezes. She has no rales.  Musculoskeletal: She exhibits no edema.  Lymphadenopathy:    She has cervical adenopathy.  Neurological: She is alert and oriented to person, place, and time.  Skin: Skin is warm and dry. No rash noted. No erythema. No pallor.  Psychiatric: She has a normal mood and affect. Her behavior is normal. Thought content normal.  Vitals reviewed.    Assessment & Plan:    1. Acute upper respiratory infection - Unable to assess whether pt has formed immune response to influenza after her vaccination 3 -4 weeks ago or if strain of flu vaccine is  correct. Pt advised to begin Tamiflu. See medications prescribed below.    Meds ordered this encounter  Medications  . Phenylephrine-DM-GG-APAP (MUCINEX FAST-MAX COLD FLU) 5-10-200-325 MG/10ML LIQD    Sig: Take 20 mLs by mouth every 4 (four) hours for 5 days.    Dispense:  1 Bottle    Refill:  0    Order Specific Question:   Supervising Provider    Answer:    Hoy Register [[id number]]  . naproxen (NAPROSYN) 500 MG tablet    Sig: Take 1 tablet (500 mg total) by mouth 2 (two) times daily with a meal.    Dispense:  30 tablet    Refill:  0    Order Specific Question:   Supervising Provider    Answer:   Hoy Register [[id number]]  . oseltamivir (TAMIFLU) 75 MG capsule    Sig: Take 1 capsule (75 mg total) by mouth 2 (two) times daily for 5 days.    Dispense:  10 capsule    Refill:  0    Order Specific Question:   Supervising Provider    Answer:   Hoy Register [[id number]]    Follow-up: Return if symptoms worsen or fail to improve.   Loletta Specter PA

## 2018-06-24 NOTE — Patient Instructions (Signed)
Upper Respiratory Infection, Adult Most upper respiratory infections (URIs) are caused by a virus. A URI affects the nose, throat, and upper air passages. The most common type of URI is often called "the common cold." Follow these instructions at home:  Take medicines only as told by your doctor.  Gargle warm saltwater or take cough drops to comfort your throat as told by your doctor.  Use a warm mist humidifier or inhale steam from a shower to increase air moisture. This may make it easier to breathe.  Drink enough fluid to keep your pee (urine) clear or pale yellow.  Eat soups and other clear broths.  Have a healthy diet.  Rest as needed.  Go back to work when your fever is gone or your doctor says it is okay. ? You may need to stay home longer to avoid giving your URI to others. ? You can also wear a face mask and wash your hands often to prevent spread of the virus.  Use your inhaler more if you have asthma.  Do not use any tobacco products, including cigarettes, chewing tobacco, or electronic cigarettes. If you need help quitting, ask your doctor. Contact a doctor if:  You are getting worse, not better.  Your symptoms are not helped by medicine.  You have chills.  You are getting more short of breath.  You have brown or red mucus.  You have yellow or brown discharge from your nose.  You have pain in your face, especially when you bend forward.  You have a fever.  You have puffy (swollen) neck glands.  You have pain while swallowing.  You have white areas in the back of your throat. Get help right away if:  You have very bad or constant: ? Headache. ? Ear pain. ? Pain in your forehead, behind your eyes, and over your cheekbones (sinus pain). ? Chest pain.  You have long-lasting (chronic) lung disease and any of the following: ? Wheezing. ? Long-lasting cough. ? Coughing up blood. ? A change in your usual mucus.  You have a stiff neck.  You have  changes in your: ? Vision. ? Hearing. ? Thinking. ? Mood. This information is not intended to replace advice given to you by your health care provider. Make sure you discuss any questions you have with your health care provider. Document Released: 01/24/2008 Document Revised: 04/09/2016 Document Reviewed: 11/12/2013 Elsevier Interactive Patient Education  2018 [employer]  

## 2018-11-07 ENCOUNTER — Other Ambulatory Visit (HOSPITAL_COMMUNITY)
Admission: RE | Admit: 2018-11-07 | Discharge: 2018-11-07 | Disposition: A | Discharge status: Home / Self Care | Payer: No Typology Code available for payment source | Source: Ambulatory Visit | Attending: Primary Care | Admitting: Primary Care

## 2018-11-07 ENCOUNTER — Telehealth: Payer: Self-pay | Admitting: Family Medicine

## 2018-11-07 ENCOUNTER — Ambulatory Visit (INDEPENDENT_AMBULATORY_CARE_PROVIDER_SITE_OTHER): Payer: No Typology Code available for payment source | Admitting: Primary Care

## 2018-11-07 ENCOUNTER — Other Ambulatory Visit: Payer: Self-pay

## 2018-11-07 ENCOUNTER — Encounter (INDEPENDENT_AMBULATORY_CARE_PROVIDER_SITE_OTHER): Payer: Self-pay | Admitting: Primary Care

## 2018-11-07 VITALS — BP 130/83 | HR 72 | Temp 98.3°F | Ht 60.0 in | Wt 167.0 lb

## 2018-11-07 DIAGNOSIS — N898 Other specified noninflammatory disorders of vagina: Secondary | ICD-10-CM | POA: Insufficient documentation

## 2018-11-07 DIAGNOSIS — N912 Amenorrhea, unspecified: Secondary | ICD-10-CM
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
## 2018-11-07 NOTE — Progress Notes (Signed)
Acute Office Visit  Subjective:    Patient ID: Brittney Cummings, female    DOB: August 01, 1996, 23 y.o.   MRN: [id number]  Chief Complaint  Patient presents with  . Exposure to STD  . Possible Pregnancy    HPI Patient is in today for an acute visit requesting urine pregnancy test and STD testing.  Past Medical History:  Diagnosis Date  . Medical history non-contributory     No past surgical history on file.  No family history on file.  Social History   Socioeconomic History  . Marital status: Single    Spouse name: Not on file  . Number of children: Not on file  . Years of education: Not on file  . Highest education level: Not on file  Occupational History  . Not on file  Social Needs  . Financial resource strain: Not on file  . Food insecurity:    Worry: Not on file    Inability: Not on file  . Transportation needs:    Medical: Not on file    Non-medical: Not on file  Tobacco Use  . Smoking status: Never Smoker  . Smokeless tobacco: Never Used  Substance and Sexual Activity  . Alcohol use: No  . Drug use: No  . Sexual activity: Yes    Birth control/protection: Condom, None    Comment: preg  Lifestyle  . Physical activity:    Days per week: Not on file    Minutes per session: Not on file  . Stress: Not on file  Relationships  . Social connections:    Talks on phone: Not on file    Gets together: Not on file    Attends religious service: Not on file    Active member of club or organization: Not on file    Attends meetings of clubs or organizations: Not on file    Relationship status: Not on file  . Intimate partner violence:    Fear of current or ex partner: Not on file    Emotionally abused: Not on file    Physically abused: Not on file    Forced sexual activity: Not on file  Other Topics Concern  . Not on file  Social History Narrative  . Not on file    Outpatient Medications Prior to Visit  Medication Sig Dispense Refill  . diclofenac  sodium (VOLTAREN) 1 % GEL Apply 4 g topically 4 (four) times daily. Right knee (Patient not taking: Reported on 04/30/2018) 1 Tube 0  . naproxen (NAPROSYN) 500 MG tablet Take 1 tablet (500 mg total) by mouth 2 (two) times daily with a meal. 30 tablet 0   No facility-administered medications prior to visit.     Allergies  Allergen Reactions  . Mushroom Extract Complex Swelling    Review of Systems  Constitutional: Negative.   HENT: Negative.   Eyes: Negative.   Respiratory: Negative.   Cardiovascular: Negative.   Gastrointestinal: Negative.   Genitourinary: Positive for urgency.  Musculoskeletal: Negative.   Neurological: Negative.   Endo/Heme/Allergies: Negative.   Psychiatric/Behavioral: Negative.        Objective:    Physical Exam  Constitutional: She is oriented to person, place, and time. She appears well-developed and well-nourished.  HENT:  Head: Normocephalic and atraumatic.  Eyes: Pupils are equal, round, and reactive to light. EOM are normal.  Neck: Normal range of motion. Neck supple.  Cardiovascular: Normal rate and regular rhythm.  Pulmonary/Chest: Effort normal and breath sounds normal.  Abdominal:  Soft. Bowel sounds are normal. She exhibits distension.  Genitourinary:    Vaginal discharge present.   Musculoskeletal: Normal range of motion.  Neurological: She is alert and oriented to person, place, and time.  Skin: Skin is warm and dry.  Psychiatric: She has a normal mood and affect.    Ht 5' (1.524 m)   LMP 09/22/2018   BMI 35.23 kg/m  Wt Readings from Last 3 Encounters:  06/24/18 180 lb 6.4 oz (81.8 kg)  02/15/18 181 lb (82.1 kg)  01/25/18 175 lb (79.4 kg)    Health Maintenance Due  Topic Date Due  . INFLUENZA VACCINE  03/21/2018    There are no preventive care reminders to display for this patient.   No results found for: TSH Lab Results  Component Value Date   WBC 12.5 (H) 04/29/2014   HGB 10.0 (L) 04/29/2014   HCT 30.2 (L)  04/29/2014   MCV 86.3 04/29/2014   PLT 247 04/29/2014   No results found for: NA, K, CHLORIDE, CO2, GLUCOSE, BUN, CREATININE, BILITOT, ALKPHOS, AST, ALT, PROT, ALBUMIN, CALCIUM, ANIONGAP, EGFR, GFR No results found for: CHOL No results found for: HDL No results found for: LDLCALC No results found for: TRIG No results found for: CHOLHDL No results found for: HGBA1C     Assessment & Plan:   Problem List Items Addressed This Visit    None       No orders of the defined types were placed in this encounter.    Kerin Perna, NP

## 2018-11-08 ENCOUNTER — Ambulatory Visit: Payer: No Typology Code available for payment source | Admitting: Family Medicine

## 2018-11-08 LAB — RPR: RPR Ser Ql: NONREACTIVE

## 2018-11-08 LAB — HIV ANTIBODY (ROUTINE TESTING W REFLEX): HIV SCREEN 4TH GENERATION: NONREACTIVE

## 2018-11-08 LAB — HCG, SERUM, QUALITATIVE: hCG,Beta Subunit,Qual,Serum: NEGATIVE m[IU]/mL (ref <6)

## 2018-11-11 ENCOUNTER — Encounter (HOSPITAL_COMMUNITY): Payer: Self-pay | Admitting: Emergency Medicine

## 2018-11-11 ENCOUNTER — Ambulatory Visit (HOSPITAL_COMMUNITY)
Admission: EM | Admit: 2018-11-11 | Discharge: 2018-11-11 | Disposition: A | Discharge status: Home / Self Care | Payer: No Typology Code available for payment source | Attending: Family Medicine | Admitting: Family Medicine

## 2018-11-11 ENCOUNTER — Ambulatory Visit (INDEPENDENT_AMBULATORY_CARE_PROVIDER_SITE_OTHER): Payer: No Typology Code available for payment source

## 2018-11-11 ENCOUNTER — Other Ambulatory Visit: Payer: Self-pay

## 2018-11-11 DIAGNOSIS — M79641 Pain in right hand: Secondary | ICD-10-CM

## 2018-11-11 DIAGNOSIS — R0781 Pleurodynia: Secondary | ICD-10-CM | POA: Diagnosis not present

## 2018-11-11 DIAGNOSIS — S6991XA Unspecified injury of right wrist, hand and finger(s), initial encounter: Secondary | ICD-10-CM | POA: Diagnosis not present
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
## 2018-11-11 NOTE — Discharge Instructions (Signed)
Your x rays are normal You can take ibuprofen or aleve for the pain Rest, Ice, Elevate.  Follow up as needed for continued or worsening symptoms
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
## 2018-11-11 NOTE — ED Notes (Signed)
Patient verbalizes understanding of discharge instructions. Opportunity for questioning and answers were provided. Patient discharged from [facility].  

## 2018-11-12 LAB — CERVICOVAGINAL ANCILLARY ONLY
Bacterial vaginitis: NEGATIVE
CHLAMYDIA, DNA PROBE: NEGATIVE
Candida vaginitis: NEGATIVE
NEISSERIA GONORRHEA: NEGATIVE
TRICH (WINDOWPATH): NEGATIVE

## 2019-02-17 ENCOUNTER — Encounter: Payer: Self-pay | Admitting: Family Medicine

## 2019-02-17 ENCOUNTER — Other Ambulatory Visit: Payer: Self-pay

## 2019-02-17 ENCOUNTER — Ambulatory Visit (INDEPENDENT_AMBULATORY_CARE_PROVIDER_SITE_OTHER): Payer: No Typology Code available for payment source | Admitting: Family Medicine

## 2019-02-17 DIAGNOSIS — R4589 Other symptoms and signs involving emotional state: Secondary | ICD-10-CM | POA: Insufficient documentation

## 2019-02-17 NOTE — Assessment & Plan Note (Signed)
Symptoms of depression including anhedonia, weight loss and sleep disturbance.  Elevated PHQ-9 score of 11, GAD-7 score 7, somewhat difficult.  No report of SI or HI.  Discussed connecting with Behavioral Health and she is open to this.  Unavailable today however given information to call Neoma Laming, CSW, this week.   Return precautions discussed Follow up 1 month or sooner if needed.
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
## 2019-02-17 NOTE — Patient Instructions (Addendum)
It was nice seeing you again today. We discussed the symptoms of depression you are currently feeling and agreed upon connecting you with our [place] specialist.  Since they were not in the office today, I have given you the number for Neoma Laming, our social worker who would be happy to help you.  Please give her a call on Tuesday, Wednesday or Thursday.  We will follow up with you in clinic in 1 month.    Be well, Lovenia Kim

## 2019-02-18 ENCOUNTER — Ambulatory Visit (INDEPENDENT_AMBULATORY_CARE_PROVIDER_SITE_OTHER)
Payer: No Typology Code available for payment source | Admitting: Student in an Organized Health Care Education/Training Program

## 2019-02-18 ENCOUNTER — Other Ambulatory Visit: Payer: Self-pay

## 2019-02-18 ENCOUNTER — Encounter: Payer: Self-pay | Admitting: Student in an Organized Health Care Education/Training Program

## 2019-02-18 DIAGNOSIS — Z0000 Encounter for general adult medical examination without abnormal findings: Secondary | ICD-10-CM

## 2019-02-18 DIAGNOSIS — R239 Unspecified skin changes: Secondary | ICD-10-CM

## 2019-02-18 DIAGNOSIS — L603 Nail dystrophy: Secondary | ICD-10-CM

## 2019-02-18 NOTE — Patient Instructions (Signed)
It was a pleasure to see you today!  To summarize our discussion for this visit:  We did a physical for your work which will go through [employer] can try a prenatal vitamin for thin nails.   Call the clinic at [phone] if your symptoms worsen or you have any concerns.  Thank you for allowing me to take part in your care,  Dr. Doristine Mango   Thanks for choosing [facility] for your primary care.

## 2019-02-18 NOTE — Progress Notes (Signed)
   Subjective:    Patient ID: Brittney Cummings, female    DOB: [birth year], 23 y.o.   MRN: [id number]   CC: physical for work  HPI: Patient has no complaints today. Feels safe in her relationships, negative for smoking or alcohol abuse. Feels well overall. Denies depression symptoms. Needs a physical for her work.  She states that she feels she has thin finger nails. They do not interfere with her lifestyle but would like to know how to thicken them.  Smoking status reviewed   ROS: pertinent noted in the HPI   Past medical history, surgical, family, and social history reviewed and updated in the EMR as appropriate.  Objective:  BP 119/75   Pulse 95   Wt 160 lb (72.6 kg)   LMP 02/07/2019 (Exact Date)   SpO2 98%   BMI 31.25 kg/m   Vitals and nursing note reviewed  General: NAD, pleasant, able to participate in exam Cardiac: RRR, S1 S2 present. normal heart sounds, no murmurs. Respiratory: CTAB, normal effort, No wheezes, rales or rhonchi Extremities: no edema or cyanosis. Skin: warm and dry, no rashes noted Neuro: alert, no obvious focal deficits Psych: Normal affect and mood   Assessment & Plan:    Physical exam Without abnormalities Return as needed  Thin nails Recommended prenatal vitamin and/or topical vitamin E    Doristine Mango, [facility] PGY-1

## 2019-03-10 ENCOUNTER — Ambulatory Visit
Payer: No Typology Code available for payment source | Admitting: Student in an Organized Health Care Education/Training Program

## 2019-04-11 DIAGNOSIS — L603 Nail dystrophy: Secondary | ICD-10-CM | POA: Insufficient documentation

## 2019-04-11 DIAGNOSIS — Z0000 Encounter for general adult medical examination without abnormal findings: Secondary | ICD-10-CM | POA: Insufficient documentation

## 2019-04-11 NOTE — Assessment & Plan Note (Signed)
Recommended prenatal vitamin and/or topical vitamin E

## 2019-04-11 NOTE — Assessment & Plan Note (Addendum)
Without abnormalities Return as needed

## 2019-06-05 ENCOUNTER — Telehealth (INDEPENDENT_AMBULATORY_CARE_PROVIDER_SITE_OTHER): Payer: No Typology Code available for payment source | Admitting: Primary Care

## 2019-07-25 ENCOUNTER — Telehealth (INDEPENDENT_AMBULATORY_CARE_PROVIDER_SITE_OTHER)
Payer: No Typology Code available for payment source | Admitting: Student in an Organized Health Care Education/Training Program

## 2019-07-25 ENCOUNTER — Other Ambulatory Visit: Payer: Self-pay

## 2019-07-25 ENCOUNTER — Encounter: Payer: Self-pay | Admitting: Student in an Organized Health Care Education/Training Program

## 2019-07-25 DIAGNOSIS — G44219 Episodic tension-type headache, not intractable: Secondary | ICD-10-CM | POA: Diagnosis not present

## 2019-07-25 DIAGNOSIS — G8929 Other chronic pain: Secondary | ICD-10-CM | POA: Insufficient documentation

## 2019-07-25 MED ORDER — AMITRIPTYLINE HCL 10 MG PO TABS: 10.0000 mg | ORAL_TABLET | Freq: Every day | ORAL | 0 refills | Status: DC

## 2019-07-25 MED FILL — AMITRIPTYLINE HCL 10 MG TAB: 10 | 30 days supply | Qty: 40 | Fill #0

## 2019-07-25 NOTE — Assessment & Plan Note (Addendum)
frequent, long lasting, and associated with a significant impact on work life. Abortive therapies needing increasingly.  amenable to prophylactic therapy Amitriptyline at bedtime, discussed possible sedation Follow-up in 4 weeks or sooner if not improving as we can adjust the dose or medication

## 2019-07-25 NOTE — Progress Notes (Signed)
[place] Telemedicine Visit  Patient consented to have virtual visit. Method of visit: Video  Encounter participants: Patient: Brittney Cummings - located at work Provider: Richarda Osmond - located at [facility] Others (if applicable): none  Chief Complaint: chronic migraines  HPI: Headaches worse since Oct. Occurring 2-3x/week and involves Left eye without vision changes. Usually starts around mid morning and lasts until sleeps off. Associated with nausea. Photophobia and noise sensitivity. Dims lights. Sumatriptan does not help. Tries to sleep it off which improves. Lasts 1-2 days pounding afterwards. Usually takes exedrin which helps. Has taken up to 4 at a time. Neck massage does not help.  Seems to be exacerbated by work. Neck muscles tense. 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 No change in work or sleeping habits.  Pain does not wake up from sleep, does not wake up with headache. Is not positional.   ROS: per HPI  Pertinent PMHx: Noncontributory  Exam:  Respiratory: negative for conversational dyspnea  Assessment/Plan:  Frequent episodic tension-type headache frequent, long lasting, and associated with a significant impact on work life. Abortive therapies needing increasingly.  amenable to prophylactic therapy Amitriptyline at bedtime, discussed possible sedation Follow-up in 4 weeks or sooner if not improving as we can adjust the dose or medication    Time spent during visit with patient: 15 minutes

## 2019-08-13 ENCOUNTER — Ambulatory Visit: Payer: No Typology Code available for payment source | Attending: Internal Medicine

## 2019-08-13 DIAGNOSIS — Z20822 Contact with and (suspected) exposure to covid-19: Secondary | ICD-10-CM

## 2019-08-14 LAB — NOVEL CORONAVIRUS, NAA: SARS-CoV-2, NAA: DETECTED — AB

## 2020-01-22 ENCOUNTER — Ambulatory Visit (INDEPENDENT_AMBULATORY_CARE_PROVIDER_SITE_OTHER)
Admission: RE | Admit: 2020-01-22 | Discharge: 2020-01-22 | Disposition: A | Discharge status: Home / Self Care | Payer: No Typology Code available for payment source | Source: Ambulatory Visit

## 2020-01-22 DIAGNOSIS — J029 Acute pharyngitis, unspecified: Secondary | ICD-10-CM

## 2020-01-22 NOTE — Discharge Instructions (Signed)
Please come in in person to have further evaluation of your sore throat May take Tylenol/ibuprofen in the meantime for discomfort

## 2020-01-22 NOTE — ED Provider Notes (Signed)
SoreVirtual Visit via Video Note:  Brittney Cummings  initiated request for Telemedicine visit with [facility] team. I connected with Brittney Cummings  on 01/22/2020 at 5:52 PM  for a synchronized telemedicine visit using a video enabled HIPPA compliant telemedicine application. I verified that I am speaking with Brittney Cummings  using two identifiers. Brittney Cummings C Brittney Tsui, PA-C  was physically located in a [facility] Urgent care site and Brittney Cummings was located at a different location.   The limitations of evaluation and management by telemedicine as well as the availability of in-person appointments were discussed. Patient was informed that she  may incur a bill ( including co-pay) for this virtual visit encounter. Brittney Cummings  expressed understanding and gave verbal consent to proceed with virtual visit.     History of Present Illness:Brittney Cummings  is a 24 y.o. female presents for evaluation of right.  Patient reports that beginning today she has developed increased pain and swelling to the right side of her throat.  Feels pain radiating to right ear.  She has also had associated cough and runny nose.  She is concerned as her sister was recently diagnosed with tonsillitis and has been around her recently.  She denies any fevers.  Reports significant pain with swallowing.    Allergies  Allergen Reactions   Mushroom Extract Complex Swelling     Past Medical History:  Diagnosis Date   Medical history non-contributory      Social History   Tobacco Use   Smoking status: Never Smoker   Smokeless tobacco: Never Used  Substance Use Topics   Alcohol use: No   Drug use: No        Observations/Objective: Physical Exam  Constitutional: She is oriented to person, place, and time and well-developed, well-nourished, and in no distress. No distress.  HENT:  Head: Normocephalic and atraumatic.  Pulmonary/Chest: Effort  normal. No respiratory distress.  Speaking in full sentences  Neurological: She is alert and oriented to person, place, and time.  Speech clear, face symmetric     Assessment and Plan:    ICD-10-CM   1. Sore throat  J02.9    Recommended in person evaluation for further evaluation of sore throat given reported unilateral swelling and concerns for needs for possible antibiotics.  Tylenol and ibuprofen in the meantime.  Patient appears stable, no respiratory distress.  Patient verbalized understanding to be seen in person.    Follow Up Instructions:     I discussed the assessment and treatment plan with the patient. The patient was provided an opportunity to ask questions and all were answered. The patient agreed with the plan and demonstrated an understanding of the instructions.   The patient was advised to call back or seek an in-person evaluation if the symptoms worsen or if the condition fails to improve as anticipated.      Lew Dawes, PA-C  01/22/2020 5:52 PM         Lew Dawes, PA-C 01/22/20 1753

## 2020-01-23 ENCOUNTER — Ambulatory Visit (HOSPITAL_COMMUNITY)
Admission: EM | Admit: 2020-01-23 | Discharge: 2020-01-23 | Disposition: A | Discharge status: Home / Self Care | Payer: No Typology Code available for payment source | Attending: Family Medicine | Admitting: Family Medicine

## 2020-01-23 ENCOUNTER — Other Ambulatory Visit: Payer: Self-pay

## 2020-01-23 ENCOUNTER — Encounter (HOSPITAL_COMMUNITY): Payer: Self-pay

## 2020-01-23 DIAGNOSIS — J029 Acute pharyngitis, unspecified: Secondary | ICD-10-CM | POA: Diagnosis present

## 2020-01-23 DIAGNOSIS — Z20822 Contact with and (suspected) exposure to covid-19: Secondary | ICD-10-CM | POA: Diagnosis not present

## 2020-01-23 DIAGNOSIS — Z8349 Family history of other endocrine, nutritional and metabolic diseases: Secondary | ICD-10-CM | POA: Insufficient documentation

## 2020-01-23 DIAGNOSIS — J069 Acute upper respiratory infection, unspecified: Secondary | ICD-10-CM | POA: Insufficient documentation

## 2020-01-23 DIAGNOSIS — Z833 Family history of diabetes mellitus: Secondary | ICD-10-CM | POA: Insufficient documentation

## 2020-01-23 DIAGNOSIS — Z8249 Family history of ischemic heart disease and other diseases of the circulatory system: Secondary | ICD-10-CM | POA: Diagnosis not present

## 2020-01-23 LAB — POCT RAPID STREP A: Streptococcus, Group A Screen (Direct): NEGATIVE

## 2020-01-23 NOTE — ED Triage Notes (Signed)
Pt c/o sore throat for approx two days. Pt states she had virtual visit yesterday and was advised to come in for possible COVID and rapid strep test.  Also reports non-productive cough, abdominal pain, chills, body aches, HA, swollen lymph nodes on right cervical side.   Last dose tylenol at 0730 today.

## 2020-01-23 NOTE — ED Provider Notes (Signed)
[facility]    CSN: [id number] Arrival date & time: 01/23/20  0802      History   Chief Complaint Chief Complaint  Patient presents with  . Sore Throat    HPI Brittney Cummings is a 24 y.o. female.   Patient is a 24 year old female who presents today with sore throat.  This has been present for the past 2 days.  This is been constant.  Did a virtual visit yesterday and was advised to come in for rapid strep and Covid testing.  She is also had mild nonproductive cough, abdominal pain, chills, body aches, headache and swollen lymph nodes.  Last dose of Tylenol was at 730 today.  No recorded fevers at home.  ROS per HPI       Past Medical History:  Diagnosis Date  . Medical history non-contributory     Patient Active Problem List   Diagnosis Date Noted  . Frequent episodic tension-type headache 07/25/2019    History reviewed. No pertinent surgical history.  OB History    Gravida  2   Para  2   Term  2   Preterm      AB      Living  2     SAB      TAB      Ectopic      Multiple      Live Births  2            Home Medications    Prior to Admission medications   Medication Sig Start Date End Date Taking? Authorizing Provider  acetaminophen (TYLENOL) 325 MG tablet Take 650 mg by mouth every 6 (six) hours as needed.   Yes [provider]  amitriptyline (ELAVIL) 10 MG tablet Take 1 tablet (10 mg total) by mouth at bedtime. May increase to 20mg  after 3 weeks if not improving. May cause sedation so take bedtime. 07/25/19   Richarda Osmond, DO    Family History Family History  Problem Relation Age of Onset  . Seizures Mother   . High Cholesterol Father   . Hypertension Father   . Diabetes Father     Social History Social History   Tobacco Use  . Smoking status: Never Smoker  . Smokeless tobacco: Never Used  Substance Use Topics  . Alcohol use: No  . Drug use: No     Allergies   Mushroom extract  complex   Review of Systems Review of Systems   Physical Exam Triage Vital Signs ED Triage Vitals [01/23/20 0815]  Enc Vitals Group     BP (!) 143/101     Pulse Rate 96     Resp 17     Temp 99 F (37.2 C)     Temp Source Oral     SpO2 100 %     Weight      Height      Head Circumference      Peak Flow      Pain Score 4     Pain Loc      Pain Edu?      Excl. in [place]?    No data found.  Updated Vital Signs BP (!) 143/101 (BP Location: Left Arm)   Pulse 96   Temp 99 F (37.2 C) (Oral)   Resp 17   LMP 12/28/2019   SpO2 100%   Visual Acuity Right Eye Distance:   Left Eye Distance:   Bilateral Distance:  Right Eye Near:   Left Eye Near:    Bilateral Near:     Physical Exam Vitals and nursing note reviewed.  Constitutional:      General: She is not in acute distress.    Appearance: Normal appearance. She is not ill-appearing, toxic-appearing or diaphoretic.  HENT:     Head: Normocephalic.     Nose: Nose normal.     Mouth/Throat:     Pharynx: Oropharynx is clear. Uvula midline. Posterior oropharyngeal erythema present.     Tonsils: No tonsillar exudate or tonsillar abscesses. 2+ on the right. 2+ on the left.  Eyes:     Conjunctiva/sclera: Conjunctivae normal.  Pulmonary:     Effort: Pulmonary effort is normal.  Musculoskeletal:        General: Normal range of motion.     Cervical back: Normal range of motion.  Skin:    General: Skin is warm and dry.     Findings: No rash.  Neurological:     Mental Status: She is alert.  Psychiatric:        Mood and Affect: Mood normal.      UC Treatments / Results  Labs (all labs ordered are listed, but only abnormal results are displayed) Labs Reviewed  SARS CORONAVIRUS 2 (TAT 6-24 HRS)  CULTURE, GROUP A STREP [facility])  POCT RAPID STREP A    EKG   Radiology No results found.  Procedures Procedures (including critical care time)  Medications Ordered in UC Medications - No data to display  Initial  Impression / Assessment and Plan / UC Course  I have reviewed the triage vital signs and the nursing notes.  Pertinent labs & imaging results that were available during my care of the patient were reviewed by me and considered in my medical decision making (see chart for details).     Sore throat Rapid strep test negative.  Sending for culture. Most likely viral upper respiratory infection. Covid swab pending recommended over the Counter medicines as needed for symptoms Follow up as needed for continued or worsening symptoms   Final Clinical Impressions(s) / UC Diagnoses   Final diagnoses:  Viral URI with cough     Discharge Instructions     Your rapid strep test was negative.  We will send for culture.  This is most likely some sort of virus You can take over-the-counter medicines for symptoms as needed Follow up as needed for continued or worsening symptoms     ED Prescriptions    None     PDMP not reviewed this encounter.   Dahlia Byes A, NP 01/23/20 [phone]

## 2020-01-23 NOTE — Discharge Instructions (Signed)
Your rapid strep test was negative.  We will send for culture.  This is most likely some sort of virus You can take over-the-counter medicines for symptoms as needed Follow up as needed for continued or worsening symptoms

## 2020-01-24 LAB — SARS CORONAVIRUS 2 (TAT 6-24 HRS): SARS Coronavirus 2: NEGATIVE

## 2020-01-25 LAB — CULTURE, GROUP A STREP (THRC)

## 2020-01-29 ENCOUNTER — Other Ambulatory Visit: Payer: Self-pay

## 2020-01-29 ENCOUNTER — Ambulatory Visit (INDEPENDENT_AMBULATORY_CARE_PROVIDER_SITE_OTHER): Payer: No Typology Code available for payment source | Admitting: Family Medicine

## 2020-01-29 VITALS — BP 130/82 | HR 75 | Wt 179.0 lb

## 2020-01-29 DIAGNOSIS — J029 Acute pharyngitis, unspecified: Secondary | ICD-10-CM | POA: Insufficient documentation

## 2020-01-29 LAB — POCT MONO (EPSTEIN BARR VIRUS): Mono, POC: NEGATIVE

## 2020-01-29 NOTE — Assessment & Plan Note (Signed)
Likely viral etiology.  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  Tonsils are enlarged but no exudates noted.  No fevers.  Unlikely strep given negative testing.  Negative Covid as well.  Can consider mono so we will get Monospot test.  If Monospot is positive and patient is still symptomatic can consider steroid treatment.  Given likely viral etiology we will discussed conservative measures.  Advised Chloraseptic spray as well as cold items such as popsicles.  Continuing warm teas and honey as well.  Follow-up if no improvement.  Sooner if worsening.

## 2020-01-29 NOTE — Progress Notes (Signed)
    SUBJECTIVE:   CHIEF COMPLAINT / HPI:   Sore throat  Patient presenting today for sore throat. Seen in UC on 6/4. At that time rapid strep negative, sent for cx. Cx was also negative. COVID negative.   On Wednesday she noted her right lymph node was swollen. On Thursday she noted sore throat and difficulty swallowing. Had a virtual visit and was recommended for strep and covid testing. On Friday it was painful to swallow, had testing that day. On Monday it continued to hurt to swallow. Tried theraflu tea, green tea, honey with lemon. These help for a little but continue to come back. Also with diarrhea x1. This usually happens before her menses which she just started. No fever, Tmax 98.6. No other sick contacts. Does note dry cough as well. No body aches. No chills. No loss of taste or smell. 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 Patient reports doing mostly water, teas, soups. Trying to keep fluids up. Normal urination.   PERTINENT  PMH / PSH: none  OBJECTIVE:   BP 130/82   Pulse 75   Wt 179 lb (81.2 kg)   SpO2 96%   BMI 34.96 kg/m   Gen: awake and alert, NAD HEENT: moist mucous membranes, oropharynx clear without erythema, tonsillar edema without exudates Cardio: RRR, no MRG Resp: CTAB, no wheezes, rales or rhonchi GI: soft, non tender, non distended, bowel sounds present Ext: no edema   ASSESSMENT/PLAN:   Sore throat Likely viral etiology.  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  Tonsils are enlarged but no exudates noted.  No fevers.  Unlikely strep given negative testing.  Negative Covid as well.  Can consider mono so we will get Monospot test.  If Monospot is positive and patient is still symptomatic can consider steroid treatment.  Given likely viral etiology we will discussed conservative measures.  Advised Chloraseptic spray as well as cold items such  as popsicles.  Continuing warm teas and honey as well.  Follow-up if no improvement.  Sooner if worsening.     Oralia Manis, DO [facility]

## 2020-01-29 NOTE — Patient Instructions (Signed)
Viral Respiratory Infection A viral respiratory infection is an illness that affects parts of the body that are used for breathing. These include the lungs, nose, and throat. It is caused by a germ called a virus. Some examples of this kind of infection are:  A cold.  The flu (influenza).  A respiratory syncytial virus (RSV) infection. A person who gets this illness may have the following symptoms:  A stuffy or runny nose.  Yellow or green fluid in the nose.  A cough.  Sneezing.  Tiredness (fatigue).  Achy muscles.  A sore throat.  Sweating or chills.  A fever.  A headache. Follow these instructions at home: Managing pain and congestion  Take over-the-counter and prescription medicines only as told by your doctor.  If you have a sore throat, gargle with salt water. Do this 3-4 times per day or as needed. To make a salt-water mixture, dissolve -1 tsp of salt in 1 cup of warm water. Make sure that all the salt dissolves.  Use nose drops made from salt water. This helps with stuffiness (congestion). It also helps soften the skin around your nose.  Drink enough fluid to keep your pee (urine) pale yellow. General instructions   Rest as much as possible.  Do not drink alcohol.  Do not use any products that have nicotine or tobacco, such as cigarettes and e-cigarettes. If you need help quitting, ask your doctor.  Keep all follow-up visits as told by your doctor. This is important. How is this prevented?   Get a flu shot every year. Ask your doctor when you should get your flu shot.  Do not let other people get your germs. If you are sick: ? Stay home from work or school. ? Wash your hands with soap and water often. Wash your hands after you cough or sneeze. If soap and water are not available, use hand sanitizer.  Avoid contact with people who are sick during cold and flu season. This is in fall and winter. Get help if:  Your symptoms last for 10 days or  longer.  Your symptoms get worse over time.  You have a fever.  You have very bad pain in your face or forehead.  Parts of your jaw or neck become very swollen. Get help right away if:  You feel pain or pressure in your chest.  You have shortness of breath.  You faint or feel like you will faint.  You keep throwing up (vomiting).  You feel confused. Summary  A viral respiratory infection is an illness that affects parts of the body that are used for breathing.  Examples of this illness include a cold, the flu, and respiratory syncytial virus (RSV) infection.  The infection can cause a runny nose, cough, sneezing, sore throat, and fever.  Follow what your doctor tells you about taking medicines, drinking lots of fluid, washing your hands, resting at home, and avoiding people who are sick. This information is not intended to replace advice given to you by your health care provider. Make sure you discuss any questions you have with your health care provider. Document Revised: 08/15/2018 Document Reviewed: 09/17/2017 Elsevier Patient Education  2020 Elsevier Inc.  

## 2020-02-02 ENCOUNTER — Telehealth: Payer: Self-pay

## 2020-02-02 ENCOUNTER — Encounter: Payer: Self-pay | Admitting: Family Medicine

## 2020-02-02 NOTE — Telephone Encounter (Signed)
Patient calls nurse line requesting letter to return to work as soon as possible. Patient was seen in clinic on 6/10 for sore throat and has work note until 6/17. Patient states that she is not currently having any symptoms and would like clearance to return to work as soon as possible.   Forwarding to PCP and Dr. Carolee Rota, RN

## 2020-02-02 NOTE — Telephone Encounter (Signed)
Letter uploaded to Dot Been, DO, PGY-3 [facility] 02/02/2020 1:44 PM

## 2020-02-06 NOTE — Telephone Encounter (Signed)
Patient states that the FMLA is required by her job to cover her for the dates that she was off 9th-14th of June.  Patient is back at work now.The letter given was needed in addition to [facility] coverage.  Glennie Hawk, CMA

## 2020-02-06 NOTE — Telephone Encounter (Signed)
I filled out the paperwork and put in pile to fax. thanks

## 2020-06-18 ENCOUNTER — Ambulatory Visit: Payer: No Typology Code available for payment source

## 2020-07-04 NOTE — Progress Notes (Signed)
    SUBJECTIVE:   CHIEF COMPLAINT / HPI:   [name] is a 24 yo F who presents for the issue below.  Pelvic Pain Experienced a 4-5/10 pelvic pain last Thursday and lasted until Saturday. Does not feel the pain today. Endorses radiation down left leg and buttock. Felt like contractions. Could not walk or sit. Worse with fast walking. Minimal relief with ibuprofen and tylenol. Has never happened before. Does desk work and sits all day. LMP 06/12/20-06/13/20 usually is 3-4 days. Had cramping and blood clots. Is sexually active and not using contraception. Last HPT Friday and negative x3.   PERTINENT  PMH / PSH: Non contributory.   OBJECTIVE:   BP 120/80   Pulse 76   Wt 185 lb 9.6 oz (84.2 kg)   SpO2 98%   BMI 36.25 kg/m   General: Appears well, no acute distress. Age appropriate. Respiratory: normal effort Extremities/MSK: No edema or cyanosis. Bilaterally tender PSIS, no radiation with palpation. No obvious leg length discrepancy. Normal gait.  No results found for this or any previous visit (from the past 72 hour(s)). Recent Results (from the past 2160 hour(s))  POCT urine pregnancy     Status: None   Collection Time: 07/05/20  4:10 PM  Result Value Ref Range   Preg Test, Ur Negative Negative    ASSESSMENT/PLAN:   Pelvic pain Acute onset, now resolved. UPT negative. Unremarkable exam. Etiology unknown at this time but consider dysmenorrhea, early pregnancy, sciatic nerve irritation from prolonged sitting. Consider treatment for sciatic nerve pain if reoccurs. Patient encouraged to take breaks from sitting during work hours.  -Meloxicam 15 mg x 14 days if pain returns -Consider symptom diary -Follow up if symptoms worsen or fail to resolve  Brittney Jumbo, DO [facility]   *Discussed patient with sports medicine fellow Dr. Karen Chafe

## 2020-07-05 ENCOUNTER — Other Ambulatory Visit: Payer: Self-pay | Admitting: Family Medicine

## 2020-07-05 ENCOUNTER — Encounter: Payer: Self-pay | Admitting: Family Medicine

## 2020-07-05 ENCOUNTER — Other Ambulatory Visit: Payer: Self-pay

## 2020-07-05 ENCOUNTER — Ambulatory Visit (INDEPENDENT_AMBULATORY_CARE_PROVIDER_SITE_OTHER): Payer: No Typology Code available for payment source | Admitting: Family Medicine

## 2020-07-05 VITALS — BP 120/80 | HR 76 | Wt 185.6 lb

## 2020-07-05 DIAGNOSIS — R102 Pelvic and perineal pain: Secondary | ICD-10-CM | POA: Diagnosis not present

## 2020-07-05 DIAGNOSIS — N926 Irregular menstruation, unspecified: Secondary | ICD-10-CM | POA: Diagnosis not present

## 2020-07-05 LAB — POCT URINE PREGNANCY: Preg Test, Ur: NEGATIVE

## 2020-07-05 MED ORDER — MELOXICAM 15 MG PO TABS: 15.0000 mg | ORAL_TABLET | Freq: Every day | ORAL | 0 refills | Status: DC

## 2020-07-05 MED FILL — MELOXICAM 15 MG TABLET: 15 | 14 days supply | Qty: 14 | Fill #0

## 2020-07-05 NOTE — Patient Instructions (Addendum)
It was wonderful to see you today.   Today we talked about:  Pelvic pain. This is likely sciatic nerve related. Consider taking breaks from sitting at work. In addition please work on the exercises from the handout. I have also prescribed meloxicam for 2 weeks if needed.   Please call the clinic at [phone] if your symptoms worsen or you have any concerns. It was our pleasure to serve you.  Dr. Salvadore Dom

## 2020-07-08 DIAGNOSIS — R102 Pelvic and perineal pain: Secondary | ICD-10-CM | POA: Insufficient documentation

## 2020-07-08 NOTE — Assessment & Plan Note (Addendum)
Acute onset, now resolved. UPT negative. Unremarkable exam. Etiology unknown at this time but consider dysmenorrhea, early pregnancy, sciatic nerve irritation from prolonged sitting. Consider treatment for sciatic nerve pain if reoccurs. Patient encouraged to take breaks from sitting during work hours.  -Meloxicam 15 mg x 14 days if pain returns -Consider symptom diary -Follow up if symptoms worsen or fail to resolve

## 2020-07-14 MED FILL — MELOXICAM 15 MG TABLET: 15 | 14 days supply | Qty: 14 | Fill #0

## 2020-08-27 ENCOUNTER — Ambulatory Visit: Payer: No Typology Code available for payment source | Attending: Internal Medicine

## 2020-08-27 DIAGNOSIS — Z23 Encounter for immunization: Secondary | ICD-10-CM

## 2020-08-27 NOTE — Progress Notes (Signed)
   Covid-19 Vaccination Clinic  Name:  Brittney Cummings    MRN: [id number] DOB: [birth year]  08/27/2020  [name] was observed post Covid-19 immunization for 15 minutes without incident. She was provided with Vaccine Information Sheet and instruction to access the V-Safe system.   [name] was instructed to call 911 with any severe reactions post vaccine: Marland Kitchen Difficulty breathing  . Swelling of face and throat  . A fast heartbeat  . A bad rash all over body  . Dizziness and weakness   Immunizations Administered    Name Date Dose VIS Date Route   Pfizer COVID-19 Vaccine 08/27/2020  4:42 PM 0.3 mL 06/09/2020 Intramuscular   Manufacturer: [employer], [employer]   Lot: [id number]   NDC: [address]-3

## 2020-09-01 ENCOUNTER — Telehealth: Payer: Self-pay

## 2020-09-01 ENCOUNTER — Other Ambulatory Visit: Payer: Self-pay

## 2020-09-01 ENCOUNTER — Telehealth (INDEPENDENT_AMBULATORY_CARE_PROVIDER_SITE_OTHER): Payer: No Typology Code available for payment source | Admitting: Family Medicine

## 2020-09-01 DIAGNOSIS — U071 COVID-19: Secondary | ICD-10-CM | POA: Diagnosis not present

## 2020-09-01 HISTORY — DX: COVID-19: U07.1

## 2020-09-01 NOTE — Telephone Encounter (Signed)
Patient calls nurse line regarding positive COVID test results from 08/31/20. Patient reports that she is experiencing cough, body aches, chills, fatigue and congestion. Has been treating symptoms with Mucinex and Tylenol, and has been drinking warm fluids. Patient expresses concerns as she is noticing some SHOB with prolonged activity and speaking. Patient is able to speak in complete sentences at this time and does not appear to be in acute distress at this time.   Spoke with preceptor Manson Passey and Germaine Pomfret). Advised that patient could be evaluated virtually and provide with strict ED precautions until appointment.   Scheduled patient virtual appointment this afternoon at 1:30 with Dr. Homero Fellers. Strict ED precautions provided to patient. Patient verbalizes understanding.   Veronda Prude, RN

## 2020-09-01 NOTE — Assessment & Plan Note (Signed)
Day 5 of symptoms.  Her only risk factor is being overweight.  She appears to be convalescing well at home.  She was given instructions regarding appropriate Tylenol use and precautions on when to return to work.  She was told as long as she is breathing comfortably while at rest and not experiencing any worsening chest pain, she is appropriate to continue convalescing at home. -Message sent to remdesivir outpatient infusions to see if she would be an appropriate candidate

## 2020-09-01 NOTE — Progress Notes (Signed)
[facility] Telemedicine Visit  Patient consented to have virtual visit and was identified by name and date of birth. Method of visit: We attempted a video call although due to technical difficulties we had to move forward with a telephone call.  Encounter participants: Patient: Brittney Cummings - located at home Provider: Mirian Mo - located at [facility] Others (if applicable): none  Chief Complaint: COVID-19 infection  HPI:  COVID-19 infection [name] reports that she first began experiencing symptoms on 1/7.  Incidentally, this is the same day she received her COVID booster vaccine.  As her symptoms worsened, she was tested for COVID on 1/11 and found to be positive.  Both of her children were tested on the same day and also found to be positive.  Her current symptoms include primarily shortness of breath with exertion and nasal congestion.  She denies shortness of breath while seated or lying down but quickly becomes short of breath if she needs to walk up and down the stairs to do laundry.  She denies shortness of breath simply with walking.  She also notes some chest tightness in the center of her ribs that feel like an " ab workout."  This seems to only be bothersome when she becomes short of breath with the exertion as noted above.  She denies any radiation of this discomfort to her arm, back or jaw.  She notices more Sunday, Monday and Tuesday and is not bothering her today.  She denies any lower extremity swelling.  ROS: per HPI  Pertinent PMHx: Overweight  Exam:  Respiratory: Breathing comfortably on room air.  Mild shortness of breath at times during the encounter although for the most part, she was able to complete sentences without effort.  No vitals could be taken for this visit.  Assessment/Plan:  COVID-19 Day 5 of symptoms.  Her only risk factor is being overweight.  She appears to be convalescing well at home.  She was given instructions  regarding appropriate Tylenol use and precautions on when to return to work.  She was told as long as she is breathing comfortably while at rest and not experiencing any worsening chest pain, she is appropriate to continue convalescing at home. -Message sent to remdesivir outpatient infusions to see if she would be an appropriate candidate    Time spent during visit with patient: 15 minutes

## 2020-10-05 ENCOUNTER — Ambulatory Visit (INDEPENDENT_AMBULATORY_CARE_PROVIDER_SITE_OTHER): Payer: No Typology Code available for payment source

## 2020-10-05 ENCOUNTER — Encounter (HOSPITAL_COMMUNITY): Payer: Self-pay | Admitting: Emergency Medicine

## 2020-10-05 ENCOUNTER — Other Ambulatory Visit: Payer: Self-pay

## 2020-10-05 ENCOUNTER — Ambulatory Visit (HOSPITAL_COMMUNITY)
Admission: EM | Admit: 2020-10-05 | Discharge: 2020-10-05 | Disposition: A | Discharge status: Home / Self Care | Payer: No Typology Code available for payment source | Attending: Medical Oncology | Admitting: Medical Oncology

## 2020-10-05 ENCOUNTER — Other Ambulatory Visit (HOSPITAL_COMMUNITY): Payer: Self-pay | Admitting: Medical Oncology

## 2020-10-05 DIAGNOSIS — M545 Low back pain, unspecified: Secondary | ICD-10-CM

## 2020-10-05 DIAGNOSIS — M5441 Lumbago with sciatica, right side: Secondary | ICD-10-CM

## 2020-10-05 DIAGNOSIS — M549 Dorsalgia, unspecified: Secondary | ICD-10-CM | POA: Diagnosis not present

## 2020-10-05 DIAGNOSIS — M5442 Lumbago with sciatica, left side: Secondary | ICD-10-CM

## 2020-10-05 DIAGNOSIS — M546 Pain in thoracic spine: Secondary | ICD-10-CM

## 2020-10-05 DIAGNOSIS — M542 Cervicalgia: Secondary | ICD-10-CM | POA: Diagnosis not present

## 2020-10-05 DIAGNOSIS — W19XXXA Unspecified fall, initial encounter: Secondary | ICD-10-CM

## 2020-10-05 MED ORDER — CYCLOBENZAPRINE HCL 10 MG PO TABS: 10.0000 mg | ORAL_TABLET | Freq: Two times a day (BID) | ORAL | 0 refills | Status: DC | PRN

## 2020-10-05 MED ORDER — KETOROLAC TROMETHAMINE 30 MG/ML IJ SOLN
30.0000 mg | Freq: Once | INTRAMUSCULAR | Status: AC
  Administered 2020-10-05: 30 mg via INTRAMUSCULAR

## 2020-10-05 MED ORDER — KETOROLAC TROMETHAMINE 30 MG/ML IJ SOLN
INTRAMUSCULAR | Status: AC
  Filled 2020-10-05: qty 1

## 2020-10-05 MED ORDER — HYDROCODONE-ACETAMINOPHEN 5-325 MG PO TABS: 2.0000 | ORAL_TABLET | ORAL | 0 refills | Status: DC | PRN

## 2020-10-05 MED FILL — HYDROCODON-APAP 5-325: 5-325 | 2 days supply | Qty: 5 | Fill #0

## 2020-10-05 MED FILL — CYCLOBENZAPRINE HCL 10 MG T: 10 | 10 days supply | Qty: 20 | Fill #0

## 2020-10-05 NOTE — ED Triage Notes (Signed)
Pt presents with pain in tailbone area after fall [place] skating rink on Sunday. States pain radiates into pelvic area., States has taken Tylenol 500 xs 2 this am and meloxicam 15 mg Sunday with some relief.

## 2020-10-05 NOTE — ED Provider Notes (Signed)
[facility]    CSN: [id number] Arrival date & time: 10/05/20  0806      History   Chief Complaint Chief Complaint  Patient presents with  . Fall    HPI Brittney Cummings is a 25 y.o. female.   HPI   Fall: Pt states that on Sunday she was ice skating with her family. She states that she was trying to exit the skating rink when a small child with a helper device skated in front of her. To avoid them she ended up loosing her balance and fell directly onto her tailbone. No other injury. This cause immediate 10/10 pain which was described as sharp and stabbing in nature. Since then the pain has continued and today is rated 9/10 in nature. She has associated pain that travels to her groin and down her legs. She states that she is unable to sit due to the pain. She has been using mobic for the pain along with tylenol with little relief. No loss fo sensation of groin but does have tingling sensation, no incontinence and no neuro changes of legs. No previous injury. [facility]: 09/26/2020  Past Medical History:  Diagnosis Date  . Medical history non-contributory     Patient Active Problem List   Diagnosis Date Noted  . COVID-19 09/01/2020  . Pelvic pain 07/08/2020  . Sore throat 01/29/2020  . Frequent episodic tension-type headache 07/25/2019    History reviewed. No pertinent surgical history.  OB History    Gravida  2   Para  2   Term  2   Preterm      AB      Living  2     SAB      IAB      Ectopic      Multiple      Live Births  2            Home Medications    Prior to Admission medications   Medication Sig Start Date End Date Taking? Authorizing Provider  acetaminophen (TYLENOL) 325 MG tablet Take 650 mg by mouth every 6 (six) hours as needed.    [provider]  amitriptyline (ELAVIL) 10 MG tablet Take 1 tablet (10 mg total) by mouth at bedtime. May increase to 20mg  after 3 weeks if not improving. May cause sedation so take  bedtime. 07/25/19   14/4/20, DO    Family History Family History  Problem Relation Age of Onset  . Seizures Mother   . High Cholesterol Father   . Hypertension Father   . Diabetes Father     Social History Social History   Tobacco Use  . Smoking status: Never Smoker  . Smokeless tobacco: Never Used  Substance Use Topics  . Alcohol use: No  . Drug use: No     Allergies   Mushroom extract complex   Review of Systems Review of Systems  As stated above in HPI Physical Exam Triage Vital Signs ED Triage Vitals  Enc Vitals Group     BP 10/05/20 0823 (!) 141/93     Pulse Rate 10/05/20 0823 84     Resp 10/05/20 0823 19     Temp 10/05/20 0823 98 F (36.7 C)     Temp Source 10/05/20 0823 Oral     SpO2 10/05/20 0823 97 %     Weight --      Height --      Head Circumference --  Peak Flow --      Pain Score 10/05/20 0819 8     Pain Loc --      Pain Edu? --      Excl. in GC? --    No data found.  Updated Vital Signs BP (!) 141/93 (BP Location: Left Arm)   Pulse 84   Temp 98 F (36.7 C) (Oral)   Resp 19   LMP 09/25/2020   SpO2 97%   Physical Exam Vitals and nursing note reviewed.  Constitutional:      General: She is in acute distress (Pt appears to be in a moderate amount of pain as she is unable to sit on the examination table).     Appearance: She is not toxic-appearing.  HENT:     Head: Normocephalic.  Musculoskeletal:     Cervical back: Spasms, tenderness and bony tenderness present. Normal range of motion.     Thoracic back: Spasms, tenderness and bony tenderness present.     Lumbar back: Spasms, tenderness and bony tenderness present.       Back:      UC Treatments / Results  Labs (all labs ordered are listed, but only abnormal results are displayed) Labs Reviewed - No data to display  EKG   Radiology DG Thoracic Spine 2 View  Result Date: 10/05/2020 CLINICAL DATA:  Upper back pain after fall. EXAM: THORACIC SPINE 2  VIEWS COMPARISON:  None. FINDINGS: There is no evidence of thoracic spine fracture. Alignment is normal. No other significant bone abnormalities are identified. IMPRESSION: Negative. Electronically Signed   By: Lupita Raider M.D.   On: 10/05/2020 09:03   DG Lumbar Spine Complete  Result Date: 10/05/2020 CLINICAL DATA:  Low back pain after fall. EXAM: LUMBAR SPINE - COMPLETE 4+ VIEW COMPARISON:  None. FINDINGS: There is no evidence of lumbar spine fracture. Alignment is normal. Intervertebral disc spaces are maintained. IMPRESSION: Negative. Electronically Signed   By: Lupita Raider M.D.   On: 10/05/2020 09:02    Procedures Procedures (including critical care time)  Medications Ordered in UC Medications  ketorolac (TORADOL) 30 MG/ML injection 30 mg (30 mg Intramuscular Given 10/05/20 0842)    Initial Impression / Assessment and Plan / UC Course  I have reviewed the triage vital signs and the nursing notes.  Pertinent labs & imaging results that were available during my care of the patient were reviewed by me and considered in my medical decision making (see chart for details).     New. X rays pending. She has a driver here today so we will administer Toradol for pain management while we wait x ray report as she has not had a mobic in one day.   Update: No evidence of bony abnormality. Will have her continue mobic at home and will prescribe flexeril to accompany this to help with her pain. I also did give her a very limited rx of Norco after reviewing PMPD. Discussed how to use along with black box warning and precautions. She is aware not to use this medication with tylenol or the muscle relaxer and not with ETOH. Discussed red flag signs and symptoms.  Final Clinical Impressions(s) / UC Diagnoses   Final diagnoses:  None   Discharge Instructions   None    ED Prescriptions    None     PDMP not reviewed this encounter.   Rushie Chestnut, [place] 10/05/20 [phone]

## 2020-10-12 ENCOUNTER — Other Ambulatory Visit: Payer: Self-pay | Admitting: Family Medicine

## 2020-10-12 ENCOUNTER — Encounter: Payer: Self-pay | Admitting: Family Medicine

## 2020-10-12 ENCOUNTER — Other Ambulatory Visit: Payer: Self-pay

## 2020-10-12 ENCOUNTER — Ambulatory Visit (INDEPENDENT_AMBULATORY_CARE_PROVIDER_SITE_OTHER): Payer: No Typology Code available for payment source | Admitting: Family Medicine

## 2020-10-12 DIAGNOSIS — M546 Pain in thoracic spine: Secondary | ICD-10-CM | POA: Diagnosis not present

## 2020-10-12 DIAGNOSIS — R030 Elevated blood-pressure reading, without diagnosis of hypertension: Secondary | ICD-10-CM | POA: Diagnosis not present

## 2020-10-12 DIAGNOSIS — M549 Dorsalgia, unspecified: Secondary | ICD-10-CM | POA: Insufficient documentation

## 2020-10-12 DIAGNOSIS — I10 Essential (primary) hypertension: Secondary | ICD-10-CM | POA: Insufficient documentation

## 2020-10-12 MED ORDER — MELOXICAM 15 MG PO TABS: 15.0000 mg | ORAL_TABLET | Freq: Every day | ORAL | 0 refills | Status: DC

## 2020-10-12 MED FILL — MELOXICAM 15 MG TABLET: 15 | 30 days supply | Qty: 30 | Fill #0

## 2020-10-12 NOTE — Progress Notes (Signed)
SUBJECTIVE:   CHIEF COMPLAINT / HPI:   Back pain Brittney Cummings reports that she fell on 2/12 during a skating event. She filling situation at her sacrum landed on her skate she fell backward injuring her back. This gave her very bad back pain for which she was seen at urgent care on 2/14. At urgent care, thoracic and lumbar spine imaging showed no evidence of compression fracture or acute fracture. She was given a shot of Toradol in addition to Flexeril and Norco to take at home. She was also told to follow-up with her PCP in a week.  For the past week, she has been taking Flexeril during the day which makes her very groggy but does not help significantly with her pain. She tried taking the Norco but it made her very drowsy and she did not like it. She did have some extra meloxicam at home from her previous injury which seem to help. She is presented to clinic today to report that her pain has not changed significantly in the past week and she wants to know if there is any more that she should be doing for her pain right now.  PERTINENT  PMH / PSH: Noncontributory  OBJECTIVE:   BP (!) 152/113   Pulse 82   Ht 5' (1.524 m)   Wt 185 lb 9.6 oz (84.2 kg)   LMP 09/25/2020   SpO2 99%   BMI 36.25 kg/m    General: Alert and cooperative and appears to be in no acute distress. Able to stand up from her chair and walk around the exam room with significant discomfort. She was able to step up onto the exam table though this was particularly painful for her. Cardio: Normal S1 and S2, no S3 or S4. Rhythm is regular. No murmurs or rubs.   Pulm: Clear to auscultation bilaterally, no crackles, wheezing, or diminished breath sounds. Normal respiratory effort Back: Some tenderness with percussion of the upper thoracic and upper lumbar spine in addition to the sacrum. Moderate tenderness to palpation of the left SI joint. Some tenderness with palpation of the paraspinal muscles in the upper thoracic area. We  performed a modified straight leg test with her seated on the exam table while I strained her leg. This did cause some shooting pain to the level of her calf on the left leg and her foot the right leg. Abdomen: Bowel sounds normal. Abdomen soft and non-tender.  Extremities: No peripheral edema. Warm/ well perfused.  Strong radial pulses. Neuro: Cranial nerves grossly intact   ASSESSMENT/PLAN:   Back pain Compression fracture of the thoracic and lumbar spine has been ruled out. It is possible she may still have a small stress fracture in this area that would not be easily assessed by x-ray. Is also possible she does have a fractured sacrum or coccyx. We discussed imaging of the sacrum but she declined imaging recognizing that it would not change management at this time. She was informed that this is likely musculoskeletal which will simply take time to heal. She was amenable to continuing meloxicam and using Flexeril at night to help her sleep.  She is sexually active and not currently on birth control. We discussed performing a pregnancy screen which she declined. She is aware that NSAIDs are not recommended in pregnancy due to potential harm to the fetus. She understands this discomfort that she is not pregnant at this time. -Mobic 15 mg daily for 1 month -Flexeril nightly as needed for discomfort  and sleep -Follow-up in 3 weeks to assess discomfort.  Transient elevated blood pressure She had an elevated blood pressure on exam today which remained elevated on repeat. She noted that she did have an elevated blood pressure at urgent care. On chart review, her systolic pressures are typically around 120 mmHg. I suspect this is most likely related to her acute musculoskeletal discomfort. I would like her to return to clinic in 3 weeks to reassess when she is more comfortable.     Brittney Mo, MD [facility]

## 2020-10-12 NOTE — Assessment & Plan Note (Signed)
Compression fracture of the thoracic and lumbar spine has been ruled out. It is possible she may still have a small stress fracture in this area that would not be easily assessed by x-ray. Is also possible she does have a fractured sacrum or coccyx. We discussed imaging of the sacrum but she declined imaging recognizing that it would not change management at this time. She was informed that this is likely musculoskeletal which will simply take time to heal. She was amenable to continuing meloxicam and using Flexeril at night to help her sleep.  She is sexually active and not currently on birth control. We discussed performing a pregnancy screen which she declined. She is aware that NSAIDs are not recommended in pregnancy due to potential harm to the fetus. She understands this discomfort that she is not pregnant at this time. -Mobic 15 mg daily for 1 month -Flexeril nightly as needed for discomfort and sleep -Follow-up in 3 weeks to assess discomfort.

## 2020-10-12 NOTE — Assessment & Plan Note (Signed)
She had an elevated blood pressure on exam today which remained elevated on repeat. She noted that she did have an elevated blood pressure at urgent care. On chart review, her systolic pressures are typically around 120 mmHg. I suspect this is most likely related to her acute musculoskeletal discomfort. I would like her to return to clinic in 3 weeks to reassess when she is more comfortable.

## 2020-10-12 NOTE — Patient Instructions (Signed)
It was great to see you today.  Here is a quick review of the things we talked about:  Back pain and sacral pain: I think most likely, this is related to muscle pain and muscle strain from your injury.  For now, I recommend that you use your muscle relaxer at night, to help you sleep.  During the day, he can continue to take meloxicam.  I would recommend a pregnancy test if you think there is any possibility you could be pregnant.  He can also add over-the-counter Tylenol to these medications for additional pain relief.  Come back to clinic in 3 weeks if you have not experienced significant improvement.  We may consider additional imaging at that time.

## 2020-10-29 MED FILL — MELOXICAM 15 MG TABLET: 15 | 30 days supply | Qty: 30 | Fill #0

## 2020-11-12 ENCOUNTER — Ambulatory Visit (INDEPENDENT_AMBULATORY_CARE_PROVIDER_SITE_OTHER)
Payer: No Typology Code available for payment source | Admitting: Student in an Organized Health Care Education/Training Program

## 2020-11-12 ENCOUNTER — Encounter: Payer: Self-pay | Admitting: Student in an Organized Health Care Education/Training Program

## 2020-11-12 ENCOUNTER — Other Ambulatory Visit: Payer: Self-pay

## 2020-11-12 VITALS — BP 144/98 | HR 80 | Wt 179.2 lb

## 2020-11-12 DIAGNOSIS — M546 Pain in thoracic spine: Secondary | ICD-10-CM | POA: Diagnosis not present

## 2020-11-12 DIAGNOSIS — R030 Elevated blood-pressure reading, without diagnosis of hypertension: Secondary | ICD-10-CM | POA: Diagnosis not present

## 2020-11-12 NOTE — Patient Instructions (Signed)
It was a pleasure to see you today!  To summarize our discussion for this visit:  For your back pain-  I am referring you to sports medicine for for further evaluation  Also, referring for physical therapy  In the mean time, below are some home exercises that may benefit you  For your blood pressure- I'd like you to take your readings at home for several days and let me know via mychart so we can make a decision on if medication is needed or not at this time.  Some additional health maintenance measures we should update are: Health Maintenance Due  Topic Date Due   Hepatitis C Screening  Never done   HPV VACCINES (1 - 2-dose series) Never done      Call the clinic at [phone] if your symptoms worsen or you have any concerns.   Thank you for allowing me to take part in your care,  Dr. Jamelle Rushing

## 2020-11-12 NOTE — Progress Notes (Signed)
    SUBJECTIVE:   CHIEF COMPLAINT / HPI: f/u back pain and BP  Feb 13th fall at skating rink. Can move more but still hurts after prolonged periods of sitting. Taking mobic which was helping but has become less effective. Using cushions like a donut which does not help. Able to participate in normal daily activities but limits the weight she can carry. More pain in her shoulders.   Blood pressure- Increased number of migraines. Increased stress with husband out of town extended period of time for work  OBJECTIVE:   BP (!) 144/98   Pulse 80   Wt 179 lb 3.2 oz (81.3 kg)   SpO2 99%   BMI 35.00 kg/m   Physical Exam Vitals and nursing note reviewed.  Constitutional:      General: She is not in acute distress.    Appearance: Normal appearance. She is obese. She is not ill-appearing or toxic-appearing.  Musculoskeletal:     Cervical back: Normal.     Comments: Full ROM of entire spine. Tenderness worse with extension of lower back. Pain is midline and severe at coccyx  Skin:    General: Skin is warm and dry.     Capillary Refill: Capillary refill takes less than 2 seconds.  Neurological:     General: No focal deficit present.     Mental Status: She is alert and oriented to person, place, and time.    ASSESSMENT/PLAN:   Back pain Continues to be tender throughout midline spine but worse at coccyx. ROM of spine is not impacted.  xrays of thoracic/lumar were normal Suspect coccyx fx - referral to [facility] - referral to PT - home exercises - OTC pain medication  Elevated blood pressure reading Elevated at two separate office visits. Patient endorses white coat anxiety, she is also in pain with her back recommended patient use at home cuff and record readings BID x3 days at least and report to me to better decide if treatment needed Proper measuring instructions discussed     Leeroy Bock, DO [facility]

## 2020-11-15 NOTE — Assessment & Plan Note (Signed)
Elevated at two separate office visits. Patient endorses white coat anxiety, she is also in pain with her back recommended patient use at home cuff and record readings BID x3 days at least and report to me to better decide if treatment needed Proper measuring instructions discussed

## 2020-11-15 NOTE — Assessment & Plan Note (Signed)
Continues to be tender throughout midline spine but worse at coccyx. ROM of spine is not impacted.  xrays of thoracic/lumar were normal Suspect coccyx fx - referral to SM - referral to PT - home exercises - OTC pain medication

## 2020-11-26 ENCOUNTER — Other Ambulatory Visit: Payer: Self-pay

## 2020-11-26 ENCOUNTER — Ambulatory Visit: Payer: No Typology Code available for payment source | Admitting: Family Medicine

## 2020-11-26 VITALS — BP 128/88 | Ht 59.0 in | Wt 184.0 lb

## 2020-11-26 DIAGNOSIS — G8929 Other chronic pain: Secondary | ICD-10-CM | POA: Diagnosis not present

## 2020-11-26 DIAGNOSIS — M5441 Lumbago with sciatica, right side: Secondary | ICD-10-CM

## 2020-11-26 DIAGNOSIS — M5416 Radiculopathy, lumbar region: Secondary | ICD-10-CM | POA: Diagnosis not present

## 2020-11-26 NOTE — Patient Instructions (Signed)
I will call you once I get the MRI results. If you have not heaerd from me within 2 working days of the MRI, please call the office.

## 2020-11-26 NOTE — Assessment & Plan Note (Signed)
Very little improvement since her original injury and it has been greater than 6 weeks.  She has radiculopathy.  She has significant interference with her daily activities.  Given the type of fall, she could have herniated nucleus pulposus.  I think at this point we are obligated to get an MRI.  If that is negative, would consider pursuing facet joint injections as she may have had a impaction injury to the lumbosacral facet joints causing her continued muscle spasm and significant tenderness to palpation in the lumbar sacral area.  We will follow up with her after the MRI.  She has been using over-the-counter Tylenol and Advil for pain and does not really want to try anything else.

## 2020-11-26 NOTE — Progress Notes (Signed)
  Brittney Cummings - 25 y.o. female MRN [id number]  Date of birth: Mar 28, 1996    SUBJECTIVE:      Chief Complaint:/ HPI:   Date of injury: 10/03/2020 Patient fell at the skating rink, landing on her buttock and sacral area.  For about 30 to 60 seconds she was in so much pain she was unable to rise.  Family members helped her eventually get up.  She was seen at urgent care a few days following and had some x-rays which were negative by report.  Since then she has had very little improvement.  She has significant pain when she tries to sit or go from a seated to standing position and it is very difficult for her to reach over and get anything off the floor.  She has to alter her body mechanics to reach down to get something from the floor, using her legs to squat rather than reaching forward.  She has had some pain radiating down the right side through the buttock and all the way to the sole of the foot intermittently.  It is a burning pain.  She has had no bowel or bladder incontinence.  She is not had any specific leg weakness and no leg giving way.  No prior history of back injury.  No personal history diabetes mellitus.   OBJECTIVE: BP 128/88   Ht 4\' 11"  (1.499 m)   Wt 184 lb (83.5 kg)   BMI 37.16 kg/m   Physical Exam:  Vital signs are reviewed. GENERAL: Well-developed female no acute distress BACK: Exquisitely tender to palpation in the L4-5 lumbar area and over bilateral PSIS.  She does not have any significant tenderness in the buttock over the piriformis area.  Limited flexion at the hip secondary to muscle spasm in the back.  She can hyperextend without pain. SKIN: The back and buttock area is without any sign of rash, no ecchymoses or bruising. EXTREMITY: Lower extremity strength 5 out of 5 symmetrical hip knee ankle. GAIT: Antalgic MOVEMENT: She sits very gingerly and has difficulty arising having to use the chair arms to help her get up.  To pick up something off before she has  to balance herself with one hand on the chair and squat down to reach the floor.  She cannot lean forward without increasing her pain. Complete lumbar spine and two-view thoracic spine x-rays from October 05, 2020.  Both the actual images and the report reviewed with the patient today. ASSESSMENT & PLAN:  See problem based charting & AVS for pt instructions. Lumbar radiculopathy, right Very little improvement since her original injury and it has been greater than 6 weeks.  She has radiculopathy.  She has significant interference with her daily activities.  Given the type of fall, she could have herniated nucleus pulposus.  I think at this point we are obligated to get an MRI.  If that is negative, would consider pursuing facet joint injections as she may have had a impaction injury to the lumbosacral facet joints causing her continued muscle spasm and significant tenderness to palpation in the lumbar sacral area.  We will follow up with her after the MRI.  She has been using over-the-counter Tylenol and Advil for pain and does not really want to try anything else.

## 2020-12-07 ENCOUNTER — Other Ambulatory Visit: Payer: Self-pay

## 2020-12-07 ENCOUNTER — Ambulatory Visit (HOSPITAL_COMMUNITY)
Admission: RE | Admit: 2020-12-07 | Discharge: 2020-12-07 | Disposition: A | Discharge status: Home / Self Care | Payer: No Typology Code available for payment source | Source: Ambulatory Visit | Attending: Family Medicine | Admitting: Family Medicine

## 2020-12-07 DIAGNOSIS — M5416 Radiculopathy, lumbar region: Secondary | ICD-10-CM | POA: Diagnosis present

## 2020-12-10 ENCOUNTER — Other Ambulatory Visit: Payer: Self-pay

## 2020-12-10 ENCOUNTER — Encounter: Payer: Self-pay | Admitting: Rehabilitative and Restorative Service Providers"

## 2020-12-10 ENCOUNTER — Ambulatory Visit
Payer: No Typology Code available for payment source | Attending: Family Medicine | Admitting: Rehabilitative and Restorative Service Providers"

## 2020-12-10 ENCOUNTER — Other Ambulatory Visit (HOSPITAL_COMMUNITY): Payer: Self-pay

## 2020-12-10 ENCOUNTER — Telehealth: Payer: Self-pay | Admitting: Family Medicine

## 2020-12-10 ENCOUNTER — Telehealth: Payer: Self-pay | Admitting: Rehabilitative and Restorative Service Providers"

## 2020-12-10 DIAGNOSIS — M545 Low back pain, unspecified: Secondary | ICD-10-CM | POA: Insufficient documentation

## 2020-12-10 DIAGNOSIS — M546 Pain in thoracic spine: Secondary | ICD-10-CM | POA: Insufficient documentation

## 2020-12-10 DIAGNOSIS — R293 Abnormal posture: Secondary | ICD-10-CM | POA: Diagnosis present

## 2020-12-10 MED ORDER — GABAPENTIN 100 MG PO CAPS
ORAL_CAPSULE | ORAL | 1 refills | Status: DC
  Filled 2020-12-10: qty 55, 30d supply, fill #0

## 2020-12-10 NOTE — Therapy (Signed)
[facility] Outpatient Rehabilitation [facility] [address] [place], [place], [address] Phone: [phone]   Fax:  [phone]  Physical Therapy Evaluation  Patient Details  Name: Brittney Cummings MRN: [id number] Date of Birth: 06/14/1996 Referring Provider (PT): Jamelle Rushing, DO   Encounter Date: 12/10/2020   PT End of Session - 12/10/20 1523    Visit Number 1    Number of Visits 12    Date for PT Re-Evaluation 01/21/21    Authorization Type [facility] Focus Plan 2022    Progress Note Due on Visit 10    PT Start Time 0205    PT Stop Time 0310    PT Time Calculation (min) 65 min    Activity Tolerance Patient limited by pain    Behavior During Therapy Restless           Past Medical History:  Diagnosis Date  . Medical history non-contributory     History reviewed. No pertinent surgical history.  There were no vitals filed for this visit.    Subjective Assessment - 12/10/20 1406    Subjective Can't see a fracture on there right now. I heard a back brace may help. Dr. gave me the option of a PT or chiropractor and I wanted to try PT. 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 Pain shoots down the right side of my leg and stops at my knee. I had two episodes where my back locked when I was trying to lift something and I needed help to get up from the floor and I had two forward falls. Primary care sent me to sports med after the 2 forward falls. I am gasping for air when I lay on my right side. I have to sleep on my chest to not have any pressure on my back. When I sleep on my back I have to sleep in such a way that my tailbone doesn't touch the bed. Having more migraines than usual. R LE radiation is new which goes to the mid-knee    Pertinent History no history of LBP. History of R  patella fracture. Pt [place] with inability to rise without assist due to coccyx pain and inability to breathe. Per sports med MD notes, potential herniated nucleus pulposus.    Limitations Sitting;House hold activities;Lifting;Standing;Walking    How long can you sit comfortably? sit with good posture 5 minutes prior to pain    How long can you stand comfortably? leaning forward, 30 min, with neutral posture, 5-10 min    How long can you walk comfortably? 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 Vertebral height OK. Conus extends to L2-3 level. Conus normal. L5-S1 mild disc bulge without significant canal or foraminal stenosis.    Patient Stated Goals to get this pain away    Currently in Pain? Yes    Pain Score 5     Pain Location Sacrum    Pain Orientation Posterior    Pain Descriptors / Indicators Dull    Pain Type Chronic pain    Pain Radiating Towards R LE to mid knee  Pain Onset More than a month ago    Pain Frequency Constant    Aggravating Factors  laying supine, sitting    Pain Relieving Factors prone    Effect of Pain on Daily Activities unable to lift items for home/work, all activities are painful and pt has to modify    Multiple Pain Sites No              [facility] PT Assessment - 12/10/20 0001      Assessment   Medical Diagnosis midline thoracic back pain    Referring Provider (PT) Jamelle Rushing, DO    Onset Date/Surgical Date 10/03/20    Hand Dominance Right    Next MD Visit TBD    Prior Therapy none      Precautions   Precautions None      Restrictions   Weight Bearing Restrictions No      Balance Screen   Has the patient fallen in the past 6 months Yes    How many times? 3    Has the patient had a decrease in activity level because of a fear of falling?  Yes   pain related   Is the patient  reluctant to leave their home because of a fear of falling?  No      Home [employer]ist information centre manager residence      Prior Function   Level of Independence Independent with basic ADLs      Cognition   Overall Cognitive Status Within Functional Limits for tasks assessed      Observation/Other Assessments   Focus on Therapeutic Outcomes (FOTO)  36%      Sensation   Light Touch Impaired by gross assessment   R LE with more impairment     Coordination   Gross Motor Movements are Fluid and Coordinated Yes      Posture/Postural Control   Posture Comments fidgety, scapula =, iliac crest height =, gluteal fold R lower, popliteal fossa =, gingerly stepped onto step stool and modifications for sitting on bed through half kneel type position to sit. She was unable to just turn and sit on the bed due to LBP. Has to have severe modifications to switch between sitting on bed to laying supine and then turning to prone. Pt very uncomfortable and painful with all transitions and even with static sitting, supine, and more comfort with prone positioning; rounded shoulders due to pain but reversible with PT verbal cues but pain reported and noted with pt body langugage      Deep Tendon Reflexes   DTR Assessment Site --   patella and achilles reflexes present bil and =     ROM / Strength   AROM / PROM / Strength PROM      AROM   Overall AROM Comments lumbar AROM present 50%, ext 25%, R sidebending 75%, L sidebending 100% with all with pain but lumbar flexion presenting with pt gasping for breath. Reassessed lumbar flexion in sitting with pt continuing to gasp for breath and reports of pressure in her chest; shoulder AROM WFL limits but with pain in thoracic region      PROM   Overall PROM Comments Hip PROM with pain all directions R > L along L3-sacral area bil  equally per patient report. Pt with verbalizations and body expressions of pain with tension.      Strength   Overall Strength  Comments L LE strength 5/5 per MMT; R LE strength 5/5 throughout but  with pain      Palpation   Spinal mobility thoracolumbar spinal mobility unable to be fully assessed due to pain with palpation to gentle muscular assessment. She did report more pain with palpation at L3-4 ; sacral border alignment good but very painful to pt rated at 8/10.    Palpation comment L Hamstring and Gastroc tighter; L lumbar paraspinals tighter      Special Tests   Other special tests SLR with questionable positivity due to pt stating she felt a bulge/lump in bil gluteals with pt unable to continue test performance. With R SLR, she felt bulge more R and with L SLR she felt it more centrally at sacrum. lifting L LE (seated hip flexion) increases R rib pain.                      Objective measurements completed on examination: See above findings.               PT Education - 12/10/20 1523    Education Details PT went over evaluative findings for PT. Pt asked questions regarding MRI results just reported to her by MD. PT explained dermatomal pattern of LE pain as related to MRI results.    Person(s) Educated Patient    Methods Explanation    Comprehension Verbalized understanding               PT Long Term Goals - 12/10/20 1530      PT LONG TERM GOAL #1   Title Pt will have Foto score of 60%    Baseline 36%    Time 6    Period Weeks    Status New    Target Date 01/21/21      PT LONG TERM GOAL #2   Title Pt will report improvement in thoracolumbarsacral pain to </= 3/10 with functional mobility    Baseline 8/10    Time 6    Period Weeks    Status New    Target Date 01/21/21      PT LONG TERM GOAL #3   Title Pt will be able to sit at work with neutral spine position with </= 3/10 pain >/= 30 min to perform work duties    Baseline has forward lean, 0 min, unable    Time 6    Period Weeks    Status New    Target Date 01/21/21      PT LONG TERM GOAL #4   Title Pt will  be able to squat with 75% less difficulty    Baseline unable to squat/reach    Time 6    Period Weeks    Status New    Target Date 01/21/21      PT LONG TERM GOAL #5   Title Pt will be indep with HEP in prep for discharge    Baseline not issued at evaluation due to high pain irritability    Time 6    Period Weeks    Status New    Target Date 01/21/21                  Plan - 12/10/20 1524    Clinical Impression Statement Pt had a fall [place] the skating rink 10/03/20 falling on her coccyx. She was unable to stand due to pain and difficulty breathing. MRI has been performed demonstrating Schmorl's nodes T11-12 and irregularities of Conus extending to L2-3. She also has a mild disc bulge L5-S1. All of  her movements are abnormal in nature due to pain ranging from sitting posture, standing posture, transitions from sitting to supine to prone position. She prefers relaxing in prone position but has pain with prone on elbows. T7-T10 and L3-sacrum are her areas of pain. Pt would benefit from PT for pain management of thoracolumbar areas as indicated above, gentle stretching of thoracolumbar/hips as tolerated, therex, manual therapy, and modalities as needed to assist with deficits.    Personal Factors and Comorbidities Comorbidity 1    Comorbidities see diagnostic testing    Examination-Activity Limitations Locomotion Level;Transfers;Bed Mobility;Reach Overhead;Bend;Caring for Others;Sit;Sleep;Carry;Squat;Stairs;Stand;Lift    Examination-Participation Restrictions Occupation;Community Activity;Driving;Interpersonal Relationship    Stability/Clinical Decision Making Evolving/Moderate complexity    Clinical Decision Making Moderate    Rehab Potential Good    PT Frequency 2x / week    PT Duration 6 weeks    PT Treatment/Interventions ADLs/Self Care Home Management;Cryotherapy;Electrical Stimulation;Iontophoresis 4mg /ml Dexamethasone;Ultrasound;Gait training;Stair training;Functional mobility  training;Therapeutic activities;Therapeutic exercise;Manual techniques;Patient/family education;Neuromuscular re-education;Passive range of motion;Taping;Moist Heat;Traction;Dry needling    PT Next Visit Plan Issue HEP, see how she felt after estim last treatment, how does she feel since beginning Gabapentin on Friday, gentle tilt and flexibility exercises, best to do in sitting or quadriped potentially due to hi irritability of pain.    Consulted and Agree with Plan of Care Patient           Patient will benefit from skilled therapeutic intervention in order to improve the following deficits and impairments:  Pain,Decreased activity tolerance,Decreased mobility,Difficulty walking,Decreased range of motion,Decreased strength,Abnormal gait,Increased muscle spasms,Decreased endurance,Improper body mechanics,Impaired flexibility,Hypomobility,Impaired sensation,Postural dysfunction  Visit Diagnosis: Pain in thoracic spine  Abnormal posture  Acute bilateral low back pain, unspecified whether sciatica present     Problem List Patient Active Problem List   Diagnosis Date Noted  . Lumbar radiculopathy, right 11/26/2020  . Back pain 10/12/2020  . Elevated blood pressure reading 10/12/2020  . COVID-19 09/01/2020  . Pelvic pain 07/08/2020  . Sore throat 01/29/2020  . Frequent episodic tension-type headache 07/25/2019    14/11/2018, PT, DPT 12/10/2020, 3:38 PM  [facility] [address] [place], [place], [place] Phone: [phone]   Fax:  [phone]  Name: Brittney Cummings MRN: [name] Date of Birth: June 20, 1996

## 2020-12-10 NOTE — Telephone Encounter (Signed)
PT sent an email to [facility], DO, and copied Anne Ng, PT regarding pt hi irritability of pain with movements and pt gasping for air and pressure in her chest with any type forward lean. PT asked DO if she would advise and/or contact the patient for further assessment.

## 2020-12-10 NOTE — Telephone Encounter (Signed)
Spoke with patient about her MRI which does not show any bulging disks.  She does have some Schmorl's nodes which are most likely incidental findings.  They do not correlate with the location where they would be causing any problems.  She did have a little bit of endplate edema but again that was in the thoracic spine.  I am not sure looking at the MRI what is causing her lumbar radiculopathy but she is going to see the physical therapist today.  We talked about adding low-dose gabapentin and I will call that in.  She will see me back after she is done PT for a few weeks.  We also discussed possibly using osteopathic manipulation and she potentially wants to consider that.  She will call me in the interim if she has any new or worsening symptoms.

## 2020-12-21 ENCOUNTER — Other Ambulatory Visit: Payer: Self-pay

## 2020-12-21 ENCOUNTER — Ambulatory Visit: Payer: No Typology Code available for payment source | Attending: Family Medicine | Admitting: Physical Therapy

## 2020-12-21 DIAGNOSIS — M546 Pain in thoracic spine: Secondary | ICD-10-CM | POA: Diagnosis present

## 2020-12-21 DIAGNOSIS — M545 Low back pain, unspecified: Secondary | ICD-10-CM | POA: Diagnosis present

## 2020-12-21 DIAGNOSIS — R293 Abnormal posture: Secondary | ICD-10-CM | POA: Diagnosis present

## 2020-12-21 NOTE — Therapy (Signed)
[facility] Outpatient Rehabilitation [facility] [address] [place], [place], [address] Phone: [phone]   Fax:  [phone]  Physical Therapy Treatment  Patient Details  Name: Brittney Cummings MRN: [id number] Date of Birth: 17-Jul-1996 Referring Provider (PT): Jamelle Rushing, DO   Encounter Date: 12/21/2020   PT End of Session - 12/21/20 1825    Visit Number 2    Number of Visits 12    Date for PT Re-Evaluation 01/21/21    Authorization Type [facility] Focus Plan 2022    Progress Note Due on Visit 10    PT Start Time 1825    PT Stop Time 1913    PT Time Calculation (min) 48 min    Activity Tolerance Patient limited by pain    Behavior During Therapy Restless           Past Medical History:  Diagnosis Date  . Medical history non-contributory     No past surgical history on file.  There were no vitals filed for this visit.   Subjective Assessment - 12/21/20 1826    Subjective Pt reports that her pain is about the same.  She reports that after E-stim she hurt more the next day and did not like this treatment.    Pertinent History no history of LBP. History of R patella fracture. Pt had a fall [place] with inability to rise without assist due to coccyx pain and inability to breathe. Per sports med MD notes, potential herniated nucleus pulposus.    Limitations Sitting;House hold activities;Lifting;Standing;Walking    How long can you sit comfortably? sit with good posture 5 minutes prior to pain    How long can you stand comfortably? leaning forward, 30 min, with neutral posture, 5-10 min    How long can you walk comfortably? 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 Vertebral height OK. Conus extends to L2-3 level. Conus normal. L5-S1 mild disc bulge without significant canal or foraminal stenosis.    Patient  Stated Goals to get this pain away    Currently in Pain? Yes    Pain Score 5     Pain Location Coccyx    Pain Descriptors / Indicators Dull    Pain Type Chronic pain    Pain Onset More than a month ago                             [facility] Adult PT Treatment/Exercise - 12/21/20 0001      Lumbar Exercises: Stretches   Other Lumbar Stretch Exercise quadruped rocking - 10x      Lumbar Exercises: Standing   Theraband Level (Row) Level 1 (Yellow)    Row Limitations 3x10    Theraband Level (Shoulder Extension) Level 1 (Yellow)    Shoulder Extension Limitations 3x10      Lumbar Exercises: Seated   Other Seated Lumbar Exercises thoracic ext in chair - 2x15    Other Seated Lumbar Exercises thoracic rotation in chair - 3x10      Knee/Hip Exercises: Aerobic   Nustep level 1 - 5 min      Knee/Hip Exercises: Standing   Hip Abduction 2 sets;10 reps    Hip Extension 2 sets;10 reps      Modalities   Modalities Moist Heat      Moist Heat Therapy  Number Minutes Moist Heat 6 Minutes    Moist Heat Location Lumbar Spine      Manual Therapy   Manual therapy comments PA mobilization grade II, sacrum to L1, pt in prone                       PT Long Term Goals - 12/10/20 1530      PT LONG TERM GOAL #1   Title Pt will have Foto score of 60%    Baseline 36%    Time 6    Period Weeks    Status New    Target Date 01/21/21      PT LONG TERM GOAL #2   Title Pt will report improvement in thoracolumbarsacral pain to </= 3/10 with functional mobility    Baseline 8/10    Time 6    Period Weeks    Status New    Target Date 01/21/21      PT LONG TERM GOAL #3   Title Pt will be able to sit at work with neutral spine position with </= 3/10 pain >/= 30 min to perform work duties    Baseline has forward lean, 0 min, unable    Time 6    Period Weeks    Status New    Target Date 01/21/21      PT LONG TERM GOAL #4   Title Pt will be able to squat with 75% less  difficulty    Baseline unable to squat/reach    Time 6    Period Weeks    Status New    Target Date 01/21/21      PT LONG TERM GOAL #5   Title Pt will be indep with HEP in prep for discharge    Baseline not issued at evaluation due to high pain irritability    Time 6    Period Weeks    Status New    Target Date 01/21/21                 Plan - 12/21/20 1848    Clinical Impression Statement Pt tolerates session fair.  She is limited in almost all  functional activities d/t pain.  Shows significantly limited thoracic ROM secondary to pain and hypomobility.  Tolerates quad rocking fair, but has high level of fatigue in paraspinals from ~t4 through lumbar spine.  Is generally fatigued with exercise (muscular).  With manual thearpy (PA) she has reproduction of familiar pain, particularly over sacrum and lower lumbar spine but reports that it is also "soothing".  She reports reduction in pain to 2/10 LBP after therapy.    Personal Factors and Comorbidities Comorbidity 1    Comorbidities see diagnostic testing    Examination-Activity Limitations Locomotion Level;Transfers;Bed Mobility;Reach Overhead;Bend;Caring for Others;Sit;Sleep;Carry;Squat;Stairs;Stand;Lift    Examination-Participation Restrictions Occupation;Community Activity;Driving;Interpersonal Relationship    Stability/Clinical Decision Making Evolving/Moderate complexity    Rehab Potential Good    PT Frequency 2x / week    PT Duration 6 weeks    PT Treatment/Interventions ADLs/Self Care Home Management;Cryotherapy;Electrical Stimulation;Iontophoresis 4mg /ml Dexamethasone;Ultrasound;Gait training;Stair training;Functional mobility training;Therapeutic activities;Therapeutic exercise;Manual techniques;Patient/family education;Neuromuscular re-education;Passive range of motion;Taping;Moist Heat;Traction;Dry needling    PT Next Visit Plan Progress gentle exercise and stretching as tolerated.  Best to do in sitting or quadriped  potentially due to hi irritability of pain.  See how she felt after PA mobilization    Consulted and Agree with Plan of Care Patient  Patient will benefit from skilled therapeutic intervention in order to improve the following deficits and impairments:  Pain,Decreased activity tolerance,Decreased mobility,Difficulty walking,Decreased range of motion,Decreased strength,Abnormal gait,Increased muscle spasms,Decreased endurance,Improper body mechanics,Impaired flexibility,Hypomobility,Impaired sensation,Postural dysfunction  Visit Diagnosis: Pain in thoracic spine  Abnormal posture  Acute bilateral low back pain, unspecified whether sciatica present     Problem List Patient Active Problem List   Diagnosis Date Noted  . Lumbar radiculopathy, right 11/26/2020  . Back pain 10/12/2020  . Elevated blood pressure reading 10/12/2020  . COVID-19 09/01/2020  . Pelvic pain 07/08/2020  . Sore throat 01/29/2020  . Frequent episodic tension-type headache 07/25/2019    Alphonzo Severance PT, DPT 12/21/20 7:13 PM  [facility] Out[facility] Rehabilitation [facility] [address] [place], [place], [address] Phone: [phone]   Fax:  [phone]  Name: Celestina Gironda MRN: [id number] Date of Birth: 07-14-1996

## 2020-12-22 ENCOUNTER — Ambulatory Visit: Payer: No Typology Code available for payment source | Admitting: Physical Therapy

## 2020-12-22 DIAGNOSIS — R293 Abnormal posture: Secondary | ICD-10-CM

## 2020-12-22 DIAGNOSIS — M546 Pain in thoracic spine: Secondary | ICD-10-CM | POA: Diagnosis not present

## 2020-12-22 DIAGNOSIS — M545 Low back pain, unspecified: Secondary | ICD-10-CM

## 2020-12-22 NOTE — Therapy (Signed)
[facility] [address] [place], [place], [address] Phone: [phone]   Fax:  [phone]  Physical Therapy Treatment  Patient Details  Name: Brittney Cummings MRN: [id number] Date of Birth: 04/22/1996 Referring Provider (PT): Jamelle Rushing, DO   Encounter Date: 12/22/2020   PT End of Session - 12/22/20 1815    Visit Number 3    Number of Visits 12    Date for PT Re-Evaluation 01/21/21    Authorization Type [facility] Focus Plan 2022    Progress Note Due on Visit 10    PT Start Time 1816    PT Stop Time 1857    PT Time Calculation (min) 41 min    Activity Tolerance Patient limited by pain    Behavior During Therapy Restless           Past Medical History:  Diagnosis Date  . Medical history non-contributory     No past surgical history on file.  There were no vitals filed for this visit.   Subjective Assessment - 12/22/20 1816    Subjective Pt reports that she woke up today after yesterday's sessoin and her back felt somewhat better as did her shoulder.  She did not like heat last session and feels this increased her pain temporarily after the session    Pertinent History no history of LBP. History of R patella fracture. Pt had a [place] skating [place] with inability to rise without assist due to coccyx pain and inability to breathe. Per sports med MD notes, potential herniated nucleus pulposus.    Pain Score 4     Pain Location Coccyx    Multiple Pain Sites Yes    Pain Score 2    Pain Location Thoracic                             [facility] Adult PT Treatment/Exercise - 12/22/20 0001      Lumbar Exercises: Stretches   Other Lumbar Stretch Exercise quadruped rocking - 10x      Lumbar Exercises: Standing   Theraband Level (Row) Level 1 (Yellow)    Row Limitations 3x12    Theraband Level (Shoulder Extension) Level 1 (Yellow)    Shoulder Extension Limitations 3x12      Lumbar  Exercises: Seated   Other Seated Lumbar Exercises thoracic ext in standing - foam roller on wall - 20x    Other Seated Lumbar Exercises thoracic rotation in chair - 3x10      Lumbar Exercises: Quadruped   Single Arm Raise Right;Left   2x6     Knee/Hip Exercises: Aerobic   Nustep level 1 - 5 min      Knee/Hip Exercises: Standing   Abduction Limitations 2x12    Extension Limitations 2x12    Lateral Step Up Limitations 2x10 ea    Forward Step Up Limitations 2x10 ea      Manual Therapy   Manual therapy comments PA mobilization grade II, sacrum to L1, pt in prone                       PT Long Term Goals - 12/10/20 1530      PT LONG TERM GOAL #1   Title Pt will have Foto score of 60%    Baseline 36%    Time 6    Period Weeks    Status New    Target Date 01/21/21  PT LONG TERM GOAL #2   Title Pt will report improvement in thoracolumbarsacral pain to </= 3/10 with functional mobility    Baseline 8/10    Time 6    Period Weeks    Status New    Target Date 01/21/21      PT LONG TERM GOAL #3   Title Pt will be able to sit at work with neutral spine position with </= 3/10 pain >/= 30 min to perform work duties    Baseline has forward lean, 0 min, unable    Time 6    Period Weeks    Status New    Target Date 01/21/21      PT LONG TERM GOAL #4   Title Pt will be able to squat with 75% less difficulty    Baseline unable to squat/reach    Time 6    Period Weeks    Status New    Target Date 01/21/21      PT LONG TERM GOAL #5   Title Pt will be indep with HEP in prep for discharge    Baseline not issued at evaluation due to high pain irritability    Time 6    Period Weeks    Status New    Target Date 01/21/21                 Plan - 12/22/20 1836    Clinical Impression Statement Pt tolerates session well.  Having lower level of baseline pain today.  Tolerates higher volume of threx today with no increase in sxs, but does demonstrate high level of  fatigue.  Will continue to progress strength and functional mobility as able.  Added in quad rocking to HEP.    Personal Factors and Comorbidities Comorbidity 1    Comorbidities see diagnostic testing    Examination-Activity Limitations Locomotion Level;Transfers;Bed Mobility;Reach Overhead;Bend;Caring for Others;Sit;Sleep;Carry;Squat;Stairs;Stand;Lift    Examination-Participation Restrictions Occupation;Community Activity;Driving;Interpersonal Relationship    Stability/Clinical Decision Making Evolving/Moderate complexity    Rehab Potential Good    PT Frequency 2x / week    PT Duration 6 weeks    PT Treatment/Interventions ADLs/Self Care Home Management;Cryotherapy;Electrical Stimulation;Iontophoresis 4mg /ml Dexamethasone;Ultrasound;Gait training;Stair training;Functional mobility training;Therapeutic activities;Therapeutic exercise;Manual techniques;Patient/family education;Neuromuscular re-education;Passive range of motion;Taping;Moist Heat;Traction;Dry needling    PT Next Visit Plan Progress gentle exercise and stretching as tolerated.  Best to do in sitting or quadriped potentially due to hi irritability of pain.  See how she felt after PA mobilization    PT Home Exercise Plan [id number]    Consulted and Agree with Plan of Care Patient           Patient will benefit from skilled therapeutic intervention in order to improve the following deficits and impairments:  Pain,Decreased activity tolerance,Decreased mobility,Difficulty walking,Decreased range of motion,Decreased strength,Abnormal gait,Increased muscle spasms,Decreased endurance,Improper body mechanics,Impaired flexibility,Hypomobility,Impaired sensation,Postural dysfunction  Visit Diagnosis: Pain in thoracic spine  Abnormal posture  Acute bilateral low back pain, unspecified whether sciatica present     Problem List Patient Active Problem List   Diagnosis Date Noted  . Lumbar radiculopathy, right 11/26/2020  . Back pain  10/12/2020  . Elevated blood pressure reading 10/12/2020  . COVID-19 09/01/2020  . Pelvic pain 07/08/2020  . Sore throat 01/29/2020  . Frequent episodic tension-type headache 07/25/2019    14/11/2018 PT, DPT 12/22/20 6:59 PM  [facility] [address] [place], [place], [place] Phone: [phone]   Fax:  [phone]  Name: Nysia Dell  MRN: [id number] Date of Birth: 08-Nov-1995

## 2020-12-28 ENCOUNTER — Ambulatory Visit: Payer: No Typology Code available for payment source | Admitting: Physical Therapy

## 2020-12-28 ENCOUNTER — Other Ambulatory Visit: Payer: Self-pay

## 2020-12-28 ENCOUNTER — Encounter: Payer: Self-pay | Admitting: Physical Therapy

## 2020-12-28 DIAGNOSIS — M545 Low back pain, unspecified: Secondary | ICD-10-CM

## 2020-12-28 DIAGNOSIS — M546 Pain in thoracic spine: Secondary | ICD-10-CM

## 2020-12-28 DIAGNOSIS — R293 Abnormal posture: Secondary | ICD-10-CM

## 2020-12-28 NOTE — Therapy (Signed)
[facility] Outpatient [facility] [address] [place], [place], [address] Phone: [phone]   Fax:  [phone]  Physical Therapy Treatment  Patient Details  Name: Brittney Cummings MRN: [id number] Date of Birth: 07-May-1996 Referring Provider (PT): Jamelle Rushing, DO   Encounter Date: 12/28/2020   PT End of Session - 12/28/20 1745    Visit Number 4    Number of Visits 12    Date for PT Re-Evaluation 01/21/21    Authorization Type [facility] Focus Plan 2022    Progress Note Due on Visit 10    PT Start Time 1745    PT Stop Time 1825    PT Time Calculation (min) 40 min    Activity Tolerance Patient limited by pain    Behavior During Therapy Restless           Past Medical History:  Diagnosis Date  . Medical history non-contributory     History reviewed. No pertinent surgical history.  There were no vitals filed for this visit.   Subjective Assessment - 12/28/20 1745    Subjective Pt reports that her shoulder pain is improving significantly.  She was up on her feet all weekend and that made her low back sore.  Her low back is particularly sore in the morning.  She feels quadruped rocking is improving her pain.    Pertinent History no history of LBP. History of R patella fracture. Pt had a fall 10[place]03[place]20 [place] skating rink with inability to rise without assist due to coccyx pain and inability to breathe. Per sports med MD notes, potential herniated nucleus pulposus.    Pain Score 6     Pain Location Coccyx                             [facility] Adult PT Treatment/Exercise - 12/28/20 0001      Lumbar Exercises: Standing   Theraband Level (Row) Level 2 (Red)    Row Limitations 3x10    Theraband Level (Shoulder Extension) Level 2 (Red)    Shoulder Extension Limitations 3x10      Lumbar Exercises: Seated   Other Seated Lumbar Exercises thoracic ext in standing - foam roller on wall - 2x20x      Lumbar Exercises: Quadruped    Other Quadruped Lumbar Exercises thoracic rotation in quadruped - 15x ea    Other Quadruped Lumbar Exercises Rocking with concentration on limiting excessive flexion - 2x10      Knee/Hip Exercises: Standing   Abduction Limitations 3x10 circles    Extension Limitations 3x10    Lateral Step Up Limitations 2x10 ea    Forward Step Up Limitations 2x10 ea      Manual Therapy   Manual therapy comments PA mobilization grade II, TL junction, pt in prone                       PT Long Term Goals - 12/10/20 1530      PT LONG TERM GOAL #1   Title Pt will have Foto score of 60%    Baseline 36%    Time 6    Period Weeks    Status New    Target Date 01/21/21      PT LONG TERM GOAL #2   Title Pt will report improvement in thoracolumbarsacral pain to </= 3/10 with functional mobility    Baseline 8/10    Time 6  Period Weeks    Status New    Target Date 01/21/21      PT LONG TERM GOAL #3   Title Pt will be able to sit at work with neutral spine position with </= 3/10 pain >/= 30 min to perform work duties    Baseline has forward lean, 0 min, unable    Time 6    Period Weeks    Status New    Target Date 01/21/21      PT LONG TERM GOAL #4   Title Pt will be able to squat with 75% less difficulty    Baseline unable to squat/reach    Time 6    Period Weeks    Status New    Target Date 01/21/21      PT LONG TERM GOAL #5   Title Pt will be indep with HEP in prep for discharge    Baseline not issued at evaluation due to high pain irritability    Time 6    Period Weeks    Status New    Target Date 01/21/21                 Plan - 12/28/20 1755    Clinical Impression Statement Pt progressing well with therapy.  Increaseing activity tolerance as expected.  She does become fatigued with standing exercieses, but his is improving.  Shoulder pain 0/10 today.  Will continue quadruped progression. Pain reduced to 1-2/10 after MT.    Personal Factors and Comorbidities  Comorbidity 1    Comorbidities see diagnostic testing    Examination-Activity Limitations Locomotion Level;Transfers;Bed Mobility;Reach Overhead;Bend;Caring for Others;Sit;Sleep;Carry;Squat;Stairs;Stand;Lift    Examination-Participation Restrictions Occupation;Community Activity;Driving;Interpersonal Relationship    Stability/Clinical Decision Making Evolving/Moderate complexity    Rehab Potential Good    PT Frequency 2x / week    PT Duration 6 weeks    PT Treatment/Interventions ADLs/Self Care Home Management;Cryotherapy;Electrical Stimulation;Iontophoresis 4mg /ml Dexamethasone;Ultrasound;Gait training;Stair training;Functional mobility training;Therapeutic activities;Therapeutic exercise;Manual techniques;Patient/family education;Neuromuscular re-education;Passive range of motion;Taping;Moist Heat;Traction;Dry needling    PT Next Visit Plan Progress gentle exercise and stretching as tolerated.  Best to do in sitting or quadriped potentially due to hi irritability of pain.  See how she felt after PA mobilization    PT Home Exercise Plan [id number]    Consulted and Agree with Plan of Care Patient           Patient will benefit from skilled therapeutic intervention in order to improve the following deficits and impairments:  Pain,Decreased activity tolerance,Decreased mobility,Difficulty walking,Decreased range of motion,Decreased strength,Abnormal gait,Increased muscle spasms,Decreased endurance,Improper body mechanics,Impaired flexibility,Hypomobility,Impaired sensation,Postural dysfunction  Visit Diagnosis: Pain in thoracic spine  Abnormal posture  Acute bilateral low back pain, unspecified whether sciatica present     Problem List Patient Active Problem List   Diagnosis Date Noted  . Lumbar radiculopathy, right 11/26/2020  . Back pain 10/12/2020  . Elevated blood pressure reading 10/12/2020  . COVID-19 09/01/2020  . Pelvic pain 07/08/2020  . Sore throat 01/29/2020  . Frequent  episodic tension-type headache 07/25/2019    14/11/2018 PT, DPT 12/28/20 6:28 PM  [facility] [address] [place], [place], [place] Phone: [phone]   Fax:  [phone]  Name: Aubery Date MRN: [name] Date of Birth: [birth year]

## 2020-12-30 ENCOUNTER — Other Ambulatory Visit: Payer: Self-pay

## 2020-12-30 ENCOUNTER — Ambulatory Visit (INDEPENDENT_AMBULATORY_CARE_PROVIDER_SITE_OTHER)
Payer: No Typology Code available for payment source | Admitting: Student in an Organized Health Care Education/Training Program

## 2020-12-30 DIAGNOSIS — R030 Elevated blood-pressure reading, without diagnosis of hypertension: Secondary | ICD-10-CM

## 2020-12-30 DIAGNOSIS — R0602 Shortness of breath: Secondary | ICD-10-CM | POA: Diagnosis not present

## 2020-12-30 NOTE — Patient Instructions (Signed)
It was a pleasure to see you today!  To summarize our discussion for this visit:  I'm glad to hear that you are doing better with physical therapy.   For your breathing, it might be that since you are leaning forward more due to your tail bone hurting that you are not getting to expand your lungs all the way. I would recommend trying to straighten out your back to get full deep breaths when you feel this way to see if it helps. You can also try an incentive spirometer to help expand your lungs further.  Keep up the good work with diet and exercise. I would recommend incorporating more exercises that increase your heart rate.  Come back in a couple months for physical including pap smear and we will monitor your blood pressure again.  Some additional health maintenance measures we should update are: Health Maintenance Due  Topic Date Due  . HPV VACCINES (1 - 2-dose series) Never done  . Hepatitis C Screening  Never done  . PAP-Cervical Cytology Screening  01/25/2021  . PAP SMEAR-Modifier  01/25/2021  .    Call the clinic at [phone] if your symptoms worsen or you have any concerns.   Thank you for allowing me to take part in your care,  Dr. Jamelle Rushing   Exercising to Stay Healthy To become healthy and stay healthy, it is recommended that you do moderate-intensity and vigorous-intensity exercise. You can tell that you are exercising at a moderate intensity if your heart starts beating faster and you start breathing faster but can still hold a conversation. You can tell that you are exercising at a vigorous intensity if you are breathing much harder and faster and cannot hold a conversation while exercising. Exercising regularly is important. It has many health benefits, such as:  Improving overall fitness, flexibility, and endurance.  Increasing bone density.  Helping with weight control.  Decreasing body fat.  Increasing muscle strength.  Reducing stress and  tension.  Improving overall health. How often should I exercise? Choose an activity that you enjoy, and set realistic goals. Your health care provider can help you make an activity plan that works for you. Exercise regularly as told by your health care provider. This may include:  Doing strength training two times a week, such as: ? Lifting weights. ? Using resistance bands. ? Push-ups. ? Sit-ups. ? Yoga.  Doing a certain intensity of exercise for a given amount of time. Choose from these options: ? A total of 150 minutes of moderate-intensity exercise every week. ? A total of 75 minutes of vigorous-intensity exercise every week. ? A mix of moderate-intensity and vigorous-intensity exercise every week. Children, pregnant women, people who have not exercised regularly, people who are overweight, and older adults may need to talk with a health care provider about what activities are safe to do. If you have a medical condition, be sure to talk with your health care provider before you start a new exercise program. What are some exercise ideas? Moderate-intensity exercise ideas include:  Walking 1 mile (1.6 km) in about 15 minutes.  Biking.  Hiking.  Golfing.  Dancing.  Water aerobics. Vigorous-intensity exercise ideas include:  Walking 4.5 miles (7.2 km) or more in about 1 hour.  Jogging or running 5 miles (8 km) in about 1 hour.  Biking 10 miles (16.1 km) or more in about 1 hour.  Lap swimming.  Roller-skating or in-line skating.  Cross-country skiing.  Vigorous competitive sports, such as  football, basketball, and soccer.  Jumping rope.  Aerobic dancing.   What are some everyday activities that can help me to get exercise?  Yard work, such as: ? Pushing a Surveyor, mining. ? Raking and bagging leaves.  Washing your car.  Pushing a stroller.  Shoveling snow.  Gardening.  Washing windows or floors. How can I be more active in my day-to-day activities?  Use  stairs instead of an elevator.  Take a walk during your lunch break.  If you drive, park your car farther away from your work or school.  If you take public transportation, get off one stop early and walk the rest of the way.  Stand up or walk around during all of your indoor phone calls.  Get up, stretch, and walk around every 30 minutes throughout the day.  Enjoy exercise with a friend. Support to continue exercising will help you keep a regular routine of activity. What guidelines can I follow while exercising?  Before you start a new exercise program, talk with your health care provider.  Do not exercise so much that you hurt yourself, feel dizzy, or get very short of breath.  Wear comfortable clothes and wear shoes with good support.  Drink plenty of water while you exercise to prevent dehydration or heat stroke.  Work out until your breathing and your heartbeat get faster. Where to find more information  [place] Department of Health and Human Services: ThisPath.fi  Centers for Disease Control and Prevention (CDC): FootballExhibition.com.br Summary  Exercising regularly is important. It will improve your overall fitness, flexibility, and endurance.  Regular exercise also will improve your overall health. It can help you control your weight, reduce stress, and improve your bone density.  Do not exercise so much that you hurt yourself, feel dizzy, or get very short of breath.  Before you start a new exercise program, talk with your health care provider. This information is not intended to replace advice given to you by your health care provider. Make sure you discuss any questions you have with your health care provider. Document Revised: 07/20/2017 Document Reviewed: 06/28/2017 Elsevier Patient Education  2021 [employer].

## 2020-12-30 NOTE — Assessment & Plan Note (Signed)
Associated with being hunched forward due to avoiding putting weight on her tailbone from pain. Likely related to reduced ability to expand lungs in said position.  Asked patient to try to straighten her back in these scenarios and take deep breaths and consider an incentive spirometer to see if this helps. Patient also has increasing activity level in physical therapy which I think will improve these symptoms. Return if worsening or not improving with these measures.

## 2020-12-30 NOTE — Progress Notes (Signed)
    SUBJECTIVE:   CHIEF COMPLAINT / HPI: SOB, BP f/u  Short of breath- occurs since her back injury and particularly when she is using the exercise bike with physical therapy. Gets better with rest. Does not impact daily activities. No chest pain. She has been leaning forward a lot to avoid pressure on her tailbone due to pain and notices this is when she has the shortness of breath. Does not get short of breath when walking. Denies any wheezing. Has not occurred prior to this injury.  H/o Elevated BP- changed diet, exercising with PT.    Acne treatment for her daughter- shows product pictures and asks which cream would be best/safe.  OBJECTIVE:   BP 120/80   Pulse 82   Ht 4\' 11"  (1.499 m)   Wt 176 lb 6.4 oz (80 kg)   SpO2 98%   BMI 35.63 kg/m   Physical Exam Vitals and nursing note reviewed.  Constitutional:      General: She is not in acute distress.    Appearance: She is not ill-appearing or toxic-appearing.  HENT:     Head: Normocephalic.  Pulmonary:     Effort: Pulmonary effort is normal. No respiratory distress.     Breath sounds: No wheezing.  Neurological:     General: No focal deficit present.     Mental Status: She is alert and oriented to person, place, and time.  Psychiatric:        Mood and Affect: Mood normal.        Behavior: Behavior normal.    ASSESSMENT/PLAN:   Shortness of breath Associated with being hunched forward due to avoiding putting weight on her tailbone from pain. Likely related to reduced ability to expand lungs in said position.  Asked patient to try to straighten her back in these scenarios and take deep breaths and consider an incentive spirometer to see if this helps. Patient also has increasing activity level in physical therapy which I think will improve these symptoms. Return if worsening or not improving with these measures.  Elevated blood pressure reading Last two visits had elevated pressures and was planning to discuss starting  medication at this visit if still elevated but patient endorses improvement in diet and activity level causing the improved BP today. 120/80 and asymptomatic.  Congratulated patient and will continue to monitor.  Exercise handout provided and recommended to increase aerobic activities.     , DO [facility]

## 2020-12-30 NOTE — Assessment & Plan Note (Signed)
Last two visits had elevated pressures and was planning to discuss starting medication at this visit if still elevated but patient endorses improvement in diet and activity level causing the improved BP today. 120/80 and asymptomatic.  Congratulated patient and will continue to monitor.  Exercise handout provided and recommended to increase aerobic activities.

## 2020-12-31 ENCOUNTER — Encounter: Payer: Self-pay | Admitting: Physical Therapy

## 2020-12-31 ENCOUNTER — Ambulatory Visit: Payer: No Typology Code available for payment source | Admitting: Physical Therapy

## 2020-12-31 DIAGNOSIS — M546 Pain in thoracic spine: Secondary | ICD-10-CM | POA: Diagnosis not present

## 2020-12-31 DIAGNOSIS — R293 Abnormal posture: Secondary | ICD-10-CM

## 2020-12-31 DIAGNOSIS — M545 Low back pain, unspecified: Secondary | ICD-10-CM

## 2020-12-31 NOTE — Therapy (Signed)
[facility] [address] [place], [place], [address] Phone: [phone]   Fax:  [phone]  Physical Therapy Treatment  Patient Details  Name: Brittney Cummings MRN: [id number] Date of Birth: 03/19/1996 Referring Provider (PT): Brittney Rushing, DO   Encounter Date: 12/31/2020   PT End of Session - 12/31/20 1350    Visit Number 5    Number of Visits 12    Date for PT Re-Evaluation 01/21/21    Authorization Type [facility] Focus Plan 2022    Progress Note Due on Visit 10    PT Start Time 0148    PT Stop Time 1428    PT Time Calculation (min) 760 min    Activity Tolerance Patient limited by pain    Behavior During Therapy Restless           Past Medical History:  Diagnosis Date  . Medical history non-contributory     History reviewed. No pertinent surgical history.  There were no vitals filed for this visit.   Subjective Assessment - 12/31/20 1350    Subjective Pt reports that she felt good after last visit.  she feels like overall her low back pain is improving and she feels she is able to bend down easier.  Her mid back and shoulder pain has not resolved.    Pertinent History no history of LBP. History of R patella fracture. Pt had a [place]03[place] skating [place] with inability to rise without assist due to coccyx pain and inability to breathe. Per sports med MD notes, potential herniated nucleus pulposus.    Currently in Pain? Yes    Pain Score 4     Pain Location Coccyx    Pain Orientation Posterior                             [facility] Adult PT Treatment/Exercise - 12/31/20 0001      Lumbar Exercises: Quadruped   Madcat/Old Horse Limitations 20x    Straight Leg Raises Limitations 2x10    Other Quadruped Lumbar Exercises thoracic rotation in quadruped - 15x ea    Other Quadruped Lumbar Exercises Rocking with concentration on limiting excessive flexion - 2x10      Knee/Hip Exercises: Aerobic    Nustep level 5 - 5'      Knee/Hip Exercises: Standing   Abduction Limitations 3x12    Extension Limitations 3x12    Lateral Step Up Limitations 3x10    Forward Step Up Limitations 3x10    Wall Squat Limitations 3x10      Manual Therapy   Manual therapy comments PA mobilization grade II, TL junction, pt in prone                       PT Long Term Goals - 12/10/20 1530      PT LONG TERM GOAL #1   Title Pt will have Foto score of 60%    Baseline 36%    Time 6    Period Weeks    Status New    Target Date 01/21/21      PT LONG TERM GOAL #2   Title Pt will report improvement in thoracolumbarsacral pain to </= 3/10 with functional mobility    Baseline 8/10    Time 6    Period Weeks    Status New    Target Date 01/21/21      PT LONG TERM  GOAL #3   Title Pt will be able to sit at work with neutral spine position with </= 3/10 pain >/= 30 min to perform work duties    Baseline has forward lean, 0 min, unable    Time 6    Period Weeks    Status New    Target Date 01/21/21      PT LONG TERM GOAL #4   Title Pt will be able to squat with 75% less difficulty    Baseline unable to squat/reach    Time 6    Period Weeks    Status New    Target Date 01/21/21      PT LONG TERM GOAL #5   Title Pt will be indep with HEP in prep for discharge    Baseline not issued at evaluation due to high pain irritability    Time 6    Period Weeks    Status New    Target Date 01/21/21                 Plan - 12/31/20 1359    Clinical Impression Statement Pt is progressing well.  Consistently reduced pain at baseline.  We are able to consistently progress workload and/or volume.  Pt cued for form with hip abduciton to maximize muscle activation.  Added in wall squats today which were moderatly irritating to low back pain.    Personal Factors and Comorbidities Comorbidity 1    Comorbidities see diagnostic testing    Examination-Activity Limitations Locomotion  Level;Transfers;Bed Mobility;Reach Overhead;Bend;Caring for Others;Sit;Sleep;Carry;Squat;Stairs;Stand;Lift    Examination-Participation Restrictions Occupation;Community Activity;Driving;Interpersonal Relationship    Stability/Clinical Decision Making Evolving/Moderate complexity    Rehab Potential Good    PT Frequency 2x / week    PT Duration 6 weeks    PT Treatment/Interventions ADLs/Self Care Home Management;Cryotherapy;Electrical Stimulation;Iontophoresis 4mg /ml Dexamethasone;Ultrasound;Gait training;Stair training;Functional mobility training;Therapeutic activities;Therapeutic exercise;Manual techniques;Patient/family education;Neuromuscular re-education;Passive range of motion;Taping;Moist Heat;Traction;Dry needling    PT Next Visit Plan Progress gentle exercise and stretching as tolerated.  Best to do in sitting or quadriped potentially due to hi irritability of pain.  See how she felt after PA mobilization    PT Home Exercise Plan [email or web address]    Consulted and Agree with Plan of Care Patient           Patient will benefit from skilled therapeutic intervention in order to improve the following deficits and impairments:  Pain,Decreased activity tolerance,Decreased mobility,Difficulty walking,Decreased range of motion,Decreased strength,Abnormal gait,Increased muscle spasms,Decreased endurance,Improper body mechanics,Impaired flexibility,Hypomobility,Impaired sensation,Postural dysfunction  Visit Diagnosis: Pain in thoracic spine  Abnormal posture  Acute bilateral low back pain, unspecified whether sciatica present     Problem List Patient Active Problem List   Diagnosis Date Noted  . Shortness of breath 12/30/2020  . Lumbar radiculopathy, right 11/26/2020  . Back pain 10/12/2020  . Elevated blood pressure reading 10/12/2020  . COVID-19 09/01/2020  . Pelvic pain 07/08/2020  . Sore throat 01/29/2020  . Frequent episodic tension-type headache 07/25/2019    14/11/2018 12/31/2020, 2:29 PM  [facility] [address] [place], [place], [place] Phone: [phone]   Fax:  [phone]  Name: Brittney Cummings MRN: Brittney Cummings Date of Birth: 09/13/1995

## 2021-01-03 ENCOUNTER — Encounter: Payer: Self-pay | Admitting: Student in an Organized Health Care Education/Training Program

## 2021-01-04 ENCOUNTER — Encounter: Payer: Self-pay | Admitting: Physical Therapy

## 2021-01-04 ENCOUNTER — Ambulatory Visit: Payer: No Typology Code available for payment source | Admitting: Physical Therapy

## 2021-01-04 ENCOUNTER — Other Ambulatory Visit: Payer: Self-pay

## 2021-01-04 DIAGNOSIS — M546 Pain in thoracic spine: Secondary | ICD-10-CM

## 2021-01-04 DIAGNOSIS — M545 Low back pain, unspecified: Secondary | ICD-10-CM

## 2021-01-04 DIAGNOSIS — R293 Abnormal posture: Secondary | ICD-10-CM

## 2021-01-04 NOTE — Therapy (Signed)
[facility] [address] [place], [place], [address] Phone: [phone]   Fax:  [phone]  Physical Therapy Treatment  Patient Details  Name: Brittney Cummings MRN: [id number] Date of Birth: [birth year] Referring Provider (PT): Jamelle Rushing, DO   Encounter Date: 01/04/2021   PT End of Session - 01/04/21 1745    Visit Number 6    Number of Visits 12    Date for PT Re-Evaluation 01/21/21    Authorization Type [facility] Focus Plan 2022    Progress Note Due on Visit 10    PT Start Time 1745    PT Stop Time 1829    PT Time Calculation (min) 44 min    Activity Tolerance Patient limited by pain    Behavior During Therapy Restless           Past Medical History:  Diagnosis Date  . Medical history non-contributory     History reviewed. No pertinent surgical history.  There were no vitals filed for this visit.   Subjective Assessment - 01/04/21 1746    Subjective Pt reports that she was felling good after last session.  Her pain has been improving.  She had minimal sxs today during work.    Pertinent History no history of LBP. History of R patella fracture. Pt had a [place] with inability to rise without assist due to coccyx pain and inability to breathe. Per sports med MD notes, potential herniated nucleus pulposus.    Patient Stated Goals to get this pain away    Currently in Pain? Yes    Pain Score 2     Pain Location Coccyx                             [facility] Adult PT Treatment/Exercise - 01/04/21 0001      Lumbar Exercises: Standing   Theraband Level (Row) Level 3 (Green)    Row Limitations 3x10    Theraband Level (Shoulder Extension) Level 3 (Green)    Shoulder Extension Limitations 3x10      Lumbar Exercises: Seated   Other Seated Lumbar Exercises --      Lumbar Exercises: Quadruped   Madcat/Old Horse Limitations 20x    Straight Leg Raises Limitations --     Opposite Arm/Leg Raise Limitations 2x10    Other Quadruped Lumbar Exercises thoracic rotation in quadruped - 20x ea    Other Quadruped Lumbar Exercises Rocking with concentration on limiting excessive flexion - 2x10      Knee/Hip Exercises: Aerobic   Nustep level 6 - 5'      Knee/Hip Exercises: Standing   Abduction Limitations 3x15    Extension Limitations 3x15    Lateral Step Up Limitations 3x10 4'' step    Forward Step Up Limitations 3x10 4'' step    Wall Squat Limitations --      Manual Therapy   Manual therapy comments PA mobilization grade II, TL junction, pt in prone                       PT Long Term Goals - 12/10/20 1530      PT LONG TERM GOAL #1   Title Pt will have Foto score of 60%    Baseline 36%    Time 6    Period Weeks    Status New    Target Date 01/21/21  PT LONG TERM GOAL #2   Title Pt will report improvement in thoracolumbarsacral pain to </= 3/10 with functional mobility    Baseline 8/10    Time 6    Period Weeks    Status New    Target Date 01/21/21      PT LONG TERM GOAL #3   Title Pt will be able to sit at work with neutral spine position with </= 3/10 pain >/= 30 min to perform work duties    Baseline has forward lean, 0 min, unable    Time 6    Period Weeks    Status New    Target Date 01/21/21      PT LONG TERM GOAL #4   Title Pt will be able to squat with 75% less difficulty    Baseline unable to squat/reach    Time 6    Period Weeks    Status New    Target Date 01/21/21      PT LONG TERM GOAL #5   Title Pt will be indep with HEP in prep for discharge    Baseline not issued at evaluation due to high pain irritability    Time 6    Period Weeks    Status New    Target Date 01/21/21                 Plan - 01/04/21 1816    Clinical Impression Statement Pt is progressing well with therapy.  Today we concentrated on progressing intensity of quadruped exercises.  Pt tolerates session well with significant  fatigue but no increase in pain.  She is tolerating significantly higher exercise  volume and intensity compared to several sessions ago with minimal pain.  Continue core strengthening progression as tolerated.    Personal Factors and Comorbidities Comorbidity 1    Comorbidities see diagnostic testing    Examination-Activity Limitations Locomotion Level;Transfers;Bed Mobility;Reach Overhead;Bend;Caring for Others;Sit;Sleep;Carry;Squat;Stairs;Stand;Lift    Examination-Participation Restrictions Occupation;Community Activity;Driving;Interpersonal Relationship    Stability/Clinical Decision Making Evolving/Moderate complexity    Rehab Potential Good    PT Frequency 2x / week    PT Duration 6 weeks    PT Treatment/Interventions ADLs/Self Care Home Management;Cryotherapy;Electrical Stimulation;Iontophoresis 4mg /ml Dexamethasone;Ultrasound;Gait training;Stair training;Functional mobility training;Therapeutic activities;Therapeutic exercise;Manual techniques;Patient/family education;Neuromuscular re-education;Passive range of motion;Taping;Moist Heat;Traction;Dry needling    PT Next Visit Plan Progress gentle exercise and stretching as tolerated.  Best to do in sitting or quadriped potentially due to hi irritability of pain.  See how she felt after PA mobilization    PT Home Exercise Plan [id number]    Consulted and Agree with Plan of Care Patient           Patient will benefit from skilled therapeutic intervention in order to improve the following deficits and impairments:  Pain,Decreased activity tolerance,Decreased mobility,Difficulty walking,Decreased range of motion,Decreased strength,Abnormal gait,Increased muscle spasms,Decreased endurance,Improper body mechanics,Impaired flexibility,Hypomobility,Impaired sensation,Postural dysfunction  Visit Diagnosis: Pain in thoracic spine  Abnormal posture  Acute bilateral low back pain, unspecified whether sciatica present     Problem List Patient  Active Problem List   Diagnosis Date Noted  . Shortness of breath 12/30/2020  . Lumbar radiculopathy, right 11/26/2020  . Back pain 10/12/2020  . Elevated blood pressure reading 10/12/2020  . COVID-19 09/01/2020  . Pelvic pain 07/08/2020  . Sore throat 01/29/2020  . Frequent episodic tension-type headache 07/25/2019    14/11/2018 01/04/2021, 6:29 PM  [facility] [address] [place], [place], [place] Phone:  [phone]   Fax:  [phone]  Name: Aanya Haynes MRN: [id number] Date of Birth: 1996-08-21

## 2021-01-07 ENCOUNTER — Other Ambulatory Visit: Payer: Self-pay

## 2021-01-07 ENCOUNTER — Ambulatory Visit: Payer: No Typology Code available for payment source | Admitting: Physical Therapy

## 2021-01-07 ENCOUNTER — Encounter: Payer: Self-pay | Admitting: Physical Therapy

## 2021-01-07 DIAGNOSIS — M545 Low back pain, unspecified: Secondary | ICD-10-CM

## 2021-01-07 DIAGNOSIS — M546 Pain in thoracic spine: Secondary | ICD-10-CM | POA: Diagnosis not present

## 2021-01-07 DIAGNOSIS — R293 Abnormal posture: Secondary | ICD-10-CM

## 2021-01-07 NOTE — Therapy (Signed)
[facility] Outpatient Rehabilitation [facility] [address] [place], [place], [address] Phone: [phone]   Fax:  [phone]  Physical Therapy Treatment  Patient Details  Name: Brittney Cummings MRN: [id number] Date of Birth: [birth year] Referring Provider (PT): Jamelle Rushing, DO   Encounter Date: 01/07/2021   PT End of Session - 01/07/21 1356    Visit Number 7    Number of Visits 12    Date for PT Re-Evaluation 01/21/21    Authorization Type [facility] Focus Plan 2022    Progress Note Due on Visit 10    PT Start Time 1352    PT Stop Time 1430    PT Time Calculation (min) 38 min    Activity Tolerance Patient limited by pain;Patient tolerated treatment well    Behavior During Therapy [facility] for tasks assessed/performed           Past Medical History:  Diagnosis Date  . Medical history non-contributory     History reviewed. No pertinent surgical history.  There were no vitals filed for this visit.   Subjective Assessment - 01/07/21 1356    Subjective Pt reports that overall her pain is improving.  She has been removing some of the pillows she was using to sleep at night.  This feels fine when she is sleeping, but causes some extra pain in the morning.    Pertinent History no history of LBP. History of R patella fracture. Pt had a [place] 10[place]03[place] skating [place] with inability to rise without assist due to coccyx pain and inability to breathe. Per sports med MD notes, potential herniated nucleus pulposus.    Patient Stated Goals to get this pain away    Currently in Pain? Yes    Pain Score 3     Pain Location Coccyx              [facility] PT Assessment - 01/07/21 0001      Observation/Other Assessments   Focus on Therapeutic Outcomes (FOTO)  57%                         [facility] Adult PT Treatment/Exercise - 01/07/21 0001      Lumbar Exercises: Quadruped   Madcat/Old Horse Limitations 20x    Opposite Arm/Leg Raise Limitations 3x10       Knee/Hip Exercises: Aerobic   Nustep level 7 - 5'      Knee/Hip Exercises: Standing   Abduction Limitations 3x10 2.5#    Extension Limitations 3x10 2.5#    Lateral Step Up Limitations 3x10 4'' step - 2#    Forward Step Up Limitations 3x10 4'' step - 2#      Manual Therapy   Manual therapy comments IASTM using percussion device over lumbar paraspinals and over transverse process around TL junction                       PT Long Term Goals - 12/10/20 1530      PT LONG TERM GOAL #1   Title Pt will have Foto score of 60%    Baseline 36%    Time 6    Period Weeks    Status New    Target Date 01/21/21      PT LONG TERM GOAL #2   Title Pt will report improvement in thoracolumbarsacral pain to </= 3/10 with functional mobility    Baseline 8/10    Time 6    Period  Weeks    Status New    Target Date 01/21/21      PT LONG TERM GOAL #3   Title Pt will be able to sit at work with neutral spine position with </= 3/10 pain >/= 30 min to perform work duties    Baseline has forward lean, 0 min, unable    Time 6    Period Weeks    Status New    Target Date 01/21/21      PT LONG TERM GOAL #4   Title Pt will be able to squat with 75% less difficulty    Baseline unable to squat/reach    Time 6    Period Weeks    Status New    Target Date 01/21/21      PT LONG TERM GOAL #5   Title Pt will be indep with HEP in prep for discharge    Baseline not issued at evaluation due to high pain irritability    Time 6    Period Weeks    Status New    Target Date 01/21/21                 Plan - 01/07/21 1358    Clinical Impression Statement Pt is progressing well with therapy.  She shows a FOTO score improvement from 36 to 57 .  Today we conitnued to progress core and hip strengthening.  Pt is tolerating progression of exercies well, combined with manual therapy.  We will continue progression with planned D/C next week.    Personal Factors and Comorbidities Comorbidity 1     Comorbidities see diagnostic testing    Examination-Activity Limitations Locomotion Level;Transfers;Bed Mobility;Reach Overhead;Bend;Caring for Others;Sit;Sleep;Carry;Squat;Stairs;Stand;Lift    Examination-Participation Restrictions Occupation;Community Activity;Driving;Interpersonal Relationship    Stability/Clinical Decision Making Evolving/Moderate complexity    Rehab Potential Good    PT Frequency 2x / week    PT Duration 6 weeks    PT Treatment/Interventions ADLs/Self Care Home Management;Cryotherapy;Electrical Stimulation;Iontophoresis 4mg /ml Dexamethasone;Ultrasound;Gait training;Stair training;Functional mobility training;Therapeutic activities;Therapeutic exercise;Manual techniques;Patient/family education;Neuromuscular re-education;Passive range of motion;Taping;Moist Heat;Traction;Dry needling    PT Next Visit Plan Progress gentle exercise and stretching as tolerated.  Best to do in sitting or quadriped potentially due to hi irritability of pain.  See how she felt after PA mobilization    PT Home Exercise Plan [email or web address]    Consulted and Agree with Plan of Care Patient           Patient will benefit from skilled therapeutic intervention in order to improve the following deficits and impairments:  Pain,Decreased activity tolerance,Decreased mobility,Difficulty walking,Decreased range of motion,Decreased strength,Abnormal gait,Increased muscle spasms,Decreased endurance,Improper body mechanics,Impaired flexibility,Hypomobility,Impaired sensation,Postural dysfunction  Visit Diagnosis: Pain in thoracic spine  Abnormal posture  Acute bilateral low back pain, unspecified whether sciatica present     Problem List Patient Active Problem List   Diagnosis Date Noted  . Shortness of breath 12/30/2020  . Lumbar radiculopathy, right 11/26/2020  . Back pain 10/12/2020  . Elevated blood pressure reading 10/12/2020  . COVID-19 09/01/2020  . Pelvic pain 07/08/2020  . Sore throat  01/29/2020  . Frequent episodic tension-type headache 07/25/2019    14/11/2018 PT, DPT 01/07/21 2:34 PM  [facility] [address] [place], [place], [place] Phone: [phone]   Fax:  [phone]  Name: Brittney Cummings MRN: [name] Date of Birth: August 26, 1995

## 2021-01-11 ENCOUNTER — Encounter: Payer: Self-pay | Admitting: Physical Therapy

## 2021-01-11 ENCOUNTER — Ambulatory Visit: Payer: No Typology Code available for payment source | Admitting: Physical Therapy

## 2021-01-11 ENCOUNTER — Other Ambulatory Visit: Payer: Self-pay

## 2021-01-11 DIAGNOSIS — M545 Low back pain, unspecified: Secondary | ICD-10-CM

## 2021-01-11 DIAGNOSIS — R293 Abnormal posture: Secondary | ICD-10-CM

## 2021-01-11 DIAGNOSIS — M546 Pain in thoracic spine: Secondary | ICD-10-CM

## 2021-01-11 NOTE — Therapy (Signed)
[facility] Rehabilitation [facility] [address] [place], [place], [address] Phone: [phone]   Fax:  [phone]  Physical Therapy Treatment  Patient Details  Name: Brittney Cummings MRN: [id number] Date of Birth: October 04, 1995 Referring Provider (PT): Jamelle Rushing, DO   Encounter Date: 01/11/2021   PT End of Session - 01/11/21 1823    Visit Number 8    Number of Visits 12    Date for PT Re-Evaluation 01/21/21    Authorization Type [facility] Focus Plan 2022    Progress Note Due on Visit 10    PT Start Time 1824    PT Stop Time 1905    PT Time Calculation (min) 41 min    Activity Tolerance Patient limited by pain;Patient tolerated treatment well    Behavior During Therapy [facility] for tasks assessed/performed           Past Medical History:  Diagnosis Date  . Medical history non-contributory     History reviewed. No pertinent surgical history.  There were no vitals filed for this visit.   Subjective Assessment - 01/11/21 1823    Subjective Pt reports that overall she has been doing well. She was helping her mother move a small piece of furniture over the weekend.  This caused some radiating pain to her knees that night.  This pain is no longer present.    Pertinent History no history of LBP. History of R patella fracture. Pt had a fall [place]03[place]20 [place] skating [place] with inability to rise without assist due to coccyx pain and inability to breathe. Per sports med MD notes, potential herniated nucleus pulposus.    Patient Stated Goals to get this pain away    Currently in Pain? Yes    Pain Score 0-No pain                             [facility] Adult PT Treatment/Exercise - 01/11/21 0001      Lumbar Exercises: Stretches   Other Lumbar Stretch Exercise quadruped rocking - 20x      Lumbar Exercises: Standing   Theraband Level (Row) Level 4 (Blue)    Row Limitations 3x10    Theraband Level (Shoulder Extension) Level 4 (Blue)     Shoulder Extension Limitations 3x10      Lumbar Exercises: Quadruped   Madcat/Old Horse Limitations 20x    Opposite Arm/Leg Raise Limitations 3x12      Knee/Hip Exercises: Aerobic   Nustep level 7 - 5'      Knee/Hip Exercises: Standing   Abduction Limitations 3x10 3#    Extension Limitations 3x10 3#    Lateral Step Up Limitations 3x10 4'' step - 3#    Forward Step Up Limitations 3x10 4'' step - 3#      Manual Therapy   Manual therapy comments IASTM using percussion device over lumbar paraspinals and over transverse process around TL junction                       PT Long Term Goals - 12/10/20 1530      PT LONG TERM GOAL #1   Title Pt will have Foto score of 60%    Baseline 36%    Time 6    Period Weeks    Status New    Target Date 01/21/21      PT LONG TERM GOAL #2   Title Pt will report improvement in thoracolumbarsacral  pain to </= 3/10 with functional mobility    Baseline 8/10    Time 6    Period Weeks    Status New    Target Date 01/21/21      PT LONG TERM GOAL #3   Title Pt will be able to sit at work with neutral spine position with </= 3/10 pain >/= 30 min to perform work duties    Baseline has forward lean, 0 min, unable    Time 6    Period Weeks    Status New    Target Date 01/21/21      PT LONG TERM GOAL #4   Title Pt will be able to squat with 75% less difficulty    Baseline unable to squat/reach    Time 6    Period Weeks    Status New    Target Date 01/21/21      PT LONG TERM GOAL #5   Title Pt will be indep with HEP in prep for discharge    Baseline not issued at evaluation due to high pain irritability    Time 6    Period Weeks    Status New    Target Date 01/21/21                 Plan - 01/11/21 1837    Clinical Impression Statement Pt is progressing as expected with therapy.  Consistently lower pain scores and able to complete higher level therex.  She reports that she has very little difficulty in her daily tasks  d/t pain and seems to be making good progress with her recovery.  We will plan on d/c next visit barring an unforseen change in status.    Personal Factors and Comorbidities Comorbidity 1    Comorbidities see diagnostic testing    Examination-Activity Limitations Locomotion Level;Transfers;Bed Mobility;Reach Overhead;Bend;Caring for Others;Sit;Sleep;Carry;Squat;Stairs;Stand;Lift    Examination-Participation Restrictions Occupation;Community Activity;Driving;Interpersonal Relationship    Stability/Clinical Decision Making Evolving/Moderate complexity    Rehab Potential Good    PT Frequency 2x / week    PT Duration 6 weeks    PT Treatment/Interventions ADLs/Self Care Home Management;Cryotherapy;Electrical Stimulation;Iontophoresis 4mg /ml Dexamethasone;Ultrasound;Gait training;Stair training;Functional mobility training;Therapeutic activities;Therapeutic exercise;Manual techniques;Patient/family education;Neuromuscular re-education;Passive range of motion;Taping;Moist Heat;Traction;Dry needling    PT Next Visit Plan Progress gentle exercise and stretching as tolerated.  Best to do in sitting or quadriped potentially due to hi irritability of pain.  See how she felt after PA mobilization    PT Home Exercise Plan [email or web address]    Consulted and Agree with Plan of Care Patient           Patient will benefit from skilled therapeutic intervention in order to improve the following deficits and impairments:  Pain,Decreased activity tolerance,Decreased mobility,Difficulty walking,Decreased range of motion,Decreased strength,Abnormal gait,Increased muscle spasms,Decreased endurance,Improper body mechanics,Impaired flexibility,Hypomobility,Impaired sensation,Postural dysfunction  Visit Diagnosis: Pain in thoracic spine  Abnormal posture  Acute bilateral low back pain, unspecified whether sciatica present     Problem List Patient Active Problem List   Diagnosis Date Noted  . Shortness of breath  12/30/2020  . Lumbar radiculopathy, right 11/26/2020  . Back pain 10/12/2020  . Elevated blood pressure reading 10/12/2020  . COVID-19 09/01/2020  . Pelvic pain 07/08/2020  . Sore throat 01/29/2020  . Frequent episodic tension-type headache 07/25/2019    14/11/2018 PT, DPT 01/11/21 7:07 PM  [facility] Rehabilitation [facility] [address] [place], [place], [place] Phone: [phone]   Fax:  [phone]  Name: Brittney Cummings  Ardyth Harps MRN: [id number] Date of Birth: 13-May-1996

## 2021-01-13 ENCOUNTER — Encounter: Payer: Self-pay | Admitting: Physical Therapy

## 2021-01-13 ENCOUNTER — Ambulatory Visit: Payer: No Typology Code available for payment source | Admitting: Physical Therapy

## 2021-01-13 ENCOUNTER — Other Ambulatory Visit: Payer: Self-pay

## 2021-01-13 DIAGNOSIS — M545 Low back pain, unspecified: Secondary | ICD-10-CM

## 2021-01-13 DIAGNOSIS — M546 Pain in thoracic spine: Secondary | ICD-10-CM

## 2021-01-13 DIAGNOSIS — R293 Abnormal posture: Secondary | ICD-10-CM

## 2021-01-13 NOTE — Patient Instructions (Signed)
Access Code: [email or web address] URL: [email or web address] Date: 01/13/2021 Prepared by: Alphonzo Severance  Exercises Quadruped Rocking Backward - 1 x daily - 7 x weekly - 1 sets - 15 reps Bird Dog - 1 x daily - 7 x weekly - 3 sets - 10 reps - 5 hold Cat-Camel - 1 x daily - 7 x weekly - 2 sets - 15 reps

## 2021-01-13 NOTE — Therapy (Signed)
[place], [place], [address] Phone: [phone]   Fax:  [phone]   PHYSICAL THERAPY DISCHARGE SUMMARY  Visits from Start of Care: 9  Current functional level related to goals / functional outcomes: 63% on FOTO   Remaining deficits: See note   Education / Equipment: See note  Plan: Patient agrees to discharge.  Patient goals were met. Patient is being discharged due to meeting the stated rehab goals.  ?????       Patient Details  Name: Brittney Cummings MRN: [id number] Date of Birth: [birth year] Referring Provider (PT): Doristine Mango, DO   Encounter Date: 01/13/2021   PT End of Session - 01/13/21 1751    Visit Number 9    Number of Visits 12    Date for PT Re-Evaluation 01/21/21    Authorization Type [facility] Focus Plan 2022    Progress Note Due on Visit 10    PT Start Time 0546    PT Stop Time 0615    PT Time Calculation (min) 29 min    Activity Tolerance Patient limited by pain;Patient tolerated treatment well    Behavior During Therapy [facility] for tasks assessed/performed           Past Medical History:  Diagnosis Date  . Medical history non-contributory     History reviewed. No pertinent surgical history.  There were no vitals filed for this visit.   Subjective Assessment - 01/13/21 1751    Subjective Pt reports that thing have been progressing well.  She feels like she is ready for d/c.  She feels only minor limitations during ADLs    Pertinent History no history of LBP. History of R patella fracture. Pt had a fall 10/03/20 [place] the skating rink with inability to rise without assist due to coccyx pain and inability to breathe. Per sports med MD notes, potential herniated nucleus pulposus.    Patient Stated Goals to get this pain away    Pain Score 1     Pain Location Coccyx              [facility] PT Assessment - 01/13/21 0001      Observation/Other Assessments   Focus on  Therapeutic Outcomes (FOTO)  89                         [facility] Adult PT Treatment/Exercise - 01/13/21 0001      Lumbar Exercises: Quadruped   Madcat/Old Horse Limitations 20x    Opposite Arm/Leg Raise Limitations 3x10 10''    Other Quadruped Lumbar Exercises quad rocking 10x      Knee/Hip Exercises: Aerobic   Nustep level 7 - 5'                  PT Education - 01/13/21 1753    Education Details Went over [employer] scores, evaluated and reviewed outcome of goals wiht patient, reviewed HEP and D/C Plans    Person(s) Educated Patient    Methods Explanation;Demonstration;Handout    Comprehension Verbalized understanding;Returned demonstration               PT Long Term Goals - 01/13/21 1759      PT LONG TERM GOAL #1   Title Pt will have Foto score of 60%    Baseline 36%    Time 6    Period Weeks    Status Achieved      PT LONG TERM GOAL #2  Title Pt will report improvement in thoracolumbarsacral pain to </= 3/10 with functional mobility    Baseline 8/10    Time 6    Period Weeks    Status Achieved      PT LONG TERM GOAL #3   Title Pt will be able to sit at work with neutral spine position with </= 3/10 pain >/= 30 min to perform work duties    Baseline has forward lean, 0 min, unable    Time 6    Period Weeks    Status Achieved      PT LONG TERM GOAL #4   Title Pt will be able to squat with 75% less difficulty    Baseline unable to squat/reach    Time 6    Period Weeks    Status Achieved      PT LONG TERM GOAL #5   Title Pt will be indep with HEP in prep for discharge    Baseline not issued at evaluation due to high pain irritability    Time 6    Period Weeks    Status Achieved                 Plan - 01/13/21 1754    Clinical Impression Statement Overall pt has progressed very well with therapy.  She is having minimal pain during ADLs.  Her highest reported pain score over the last week was 1/10 in therapy.  She can function  with minimal limitations at work.  She has met all goals.  She is ready for D/C home with HEP she agrees with plan.    Personal Factors and Comorbidities Comorbidity 1    Comorbidities see diagnostic testing    Examination-Activity Limitations Locomotion Level;Transfers;Bed Mobility;Reach Overhead;Bend;Caring for Others;Sit;Sleep;Carry;Squat;Stairs;Stand;Lift    Examination-Participation Restrictions Occupation;Community Activity;Driving;Interpersonal Relationship    Stability/Clinical Decision Making Evolving/Moderate complexity    Rehab Potential Good    PT Frequency 2x / week    PT Duration 6 weeks    PT Treatment/Interventions ADLs/Self Care Home Management;Cryotherapy;Electrical Stimulation;Iontophoresis 59m/ml Dexamethasone;Ultrasound;Gait training;Stair training;Functional mobility training;Therapeutic activities;Therapeutic exercise;Manual techniques;Patient/family education;Neuromuscular re-education;Passive range of motion;Taping;Moist Heat;Traction;Dry needling    PT Next Visit Plan Progress gentle exercise and stretching as tolerated.  Best to do in sitting or quadriped potentially due to hi irritability of pain.  See how she felt after PA mobilization    PT Home Exercise Plan [email or web address]    Consulted and Agree with Plan of Care Patient           Patient will benefit from skilled therapeutic intervention in order to improve the following deficits and impairments:  Pain,Decreased activity tolerance,Decreased mobility,Difficulty walking,Decreased range of motion,Decreased strength,Abnormal gait,Increased muscle spasms,Decreased endurance,Improper body mechanics,Impaired flexibility,Hypomobility,Impaired sensation,Postural dysfunction  Visit Diagnosis: Pain in thoracic spine  Abnormal posture  Acute bilateral low back pain, unspecified whether sciatica present     Problem List Patient Active Problem List   Diagnosis Date Noted  . Shortness of breath 12/30/2020  . Lumbar  radiculopathy, right 11/26/2020  . Back pain 10/12/2020  . Elevated blood pressure reading 10/12/2020  . COVID-19 09/01/2020  . Pelvic pain 07/08/2020  . Sore throat 01/29/2020  . Frequent episodic tension-type headache 07/25/2019    KShearon BaloPT, DPT 01/13/21 6:15 PM  [place] [address]Phone: [phone]  Fax:  [phone] Name: Brittney CaldeiraMRN: [id number] of Birth: 408-03-1996

## 2021-01-25 ENCOUNTER — Other Ambulatory Visit: Payer: Self-pay

## 2021-01-25 ENCOUNTER — Other Ambulatory Visit (HOSPITAL_COMMUNITY)
Admission: RE | Admit: 2021-01-25 | Discharge: 2021-01-25 | Disposition: A | Discharge status: Home / Self Care | Payer: No Typology Code available for payment source | Source: Ambulatory Visit | Attending: Family Medicine | Admitting: Family Medicine

## 2021-01-25 ENCOUNTER — Encounter: Payer: Self-pay | Admitting: Student in an Organized Health Care Education/Training Program

## 2021-01-25 ENCOUNTER — Ambulatory Visit (INDEPENDENT_AMBULATORY_CARE_PROVIDER_SITE_OTHER)
Payer: No Typology Code available for payment source | Admitting: Student in an Organized Health Care Education/Training Program

## 2021-01-25 VITALS — BP 134/92 | HR 79 | Wt 173.6 lb

## 2021-01-25 DIAGNOSIS — Z1159 Encounter for screening for other viral diseases: Secondary | ICD-10-CM | POA: Diagnosis not present

## 2021-01-25 DIAGNOSIS — E669 Obesity, unspecified: Secondary | ICD-10-CM

## 2021-01-25 DIAGNOSIS — R030 Elevated blood-pressure reading, without diagnosis of hypertension: Secondary | ICD-10-CM

## 2021-01-25 DIAGNOSIS — Z124 Encounter for screening for malignant neoplasm of cervix: Secondary | ICD-10-CM

## 2021-01-25 DIAGNOSIS — Z6835 Body mass index (BMI) 35.0-35.9, adult: Secondary | ICD-10-CM

## 2021-01-25 DIAGNOSIS — Z0000 Encounter for general adult medical examination without abnormal findings: Secondary | ICD-10-CM

## 2021-01-25 DIAGNOSIS — Z3009 Encounter for other general counseling and advice on contraception: Secondary | ICD-10-CM

## 2021-01-25 NOTE — Assessment & Plan Note (Signed)
Pap smear collected today. Pt denied complaints.  Hep C collected today for low risk screening. Also obtained hgb A1c for risk factors including race, family history, and obesity. Pt is improving lifestyle and has seen gradual weight loss over past several visits.

## 2021-01-25 NOTE — Patient Instructions (Signed)
It was a pleasure to see you today!  To summarize our discussion for this visit:  We are going to screen for diabetes and hepatitis C today  We also collected a pap smear for cervical cancer screening.   We should check in on your blood pressure again. I think it would be a good idea to try an "ambulatory monitor" since we have had a hard time figuring out what your blood pressure is doing from appointment to appointment.   Some additional health maintenance measures we should update are: Health Maintenance Due  Topic Date Due  . HPV VACCINES (1 - 2-dose series) Never done  . Hepatitis C Screening  Never done  . PAP-Cervical Cytology Screening  01/25/2021  . PAP SMEAR-Modifier  01/25/2021  .    Call the clinic at [phone] if your symptoms worsen or you have any concerns.   Thank you for allowing me to take part in your care,  Dr. Jamelle Rushing

## 2021-01-25 NOTE — Progress Notes (Signed)
   SUBJECTIVE:   CHIEF COMPLAINT / HPI:  Well woman  Well woman exam- denies complaints today.   born in [place]. Came to [place] when she was 25yo. Endorses having UTD vaccines.  Describes healthy diet and almost daily exercise with motivation to control BP and avoid need for medications.  Dad was just diagnosed with type 2 diabetes. He is almost 26yo.  Denies alcohol (other than occasional), tobacco, illicit drug use. Sexually active and not using birth control  OBJECTIVE:   BP (!) 134/92   Pulse 79   Wt 173 lb 9.6 oz (78.7 kg)   SpO2 99%   BMI 35.06 kg/m   125/85 on repeat by provider. Physical Exam Vitals and nursing note reviewed. Exam conducted with a chaperone present.  Constitutional:      General: She is not in acute distress.    Appearance: She is not ill-appearing or toxic-appearing.  HENT:     Head: Normocephalic.     Nose: Nose normal. No congestion.     Mouth/Throat:     Mouth: Mucous membranes are moist.     Pharynx: No oropharyngeal exudate.  Eyes:     Conjunctiva/sclera: Conjunctivae normal.  Cardiovascular:     Rate and Rhythm: Normal rate and regular rhythm.     Pulses: Normal pulses.     Heart sounds: Normal heart sounds. No murmur heard.   Pulmonary:     Effort: Pulmonary effort is normal. No respiratory distress.     Breath sounds: Normal breath sounds.  Abdominal:     General: Abdomen is flat.     Palpations: Abdomen is soft. There is no mass.     Tenderness: There is no abdominal tenderness.  Genitourinary:    General: Normal vulva.     Exam position: Lithotomy position.     Vagina: Normal. No vaginal discharge.     Cervix: Normal.     Rectum: Normal.  Musculoskeletal:     Cervical back: Neck supple. No tenderness.  Lymphadenopathy:     Cervical: No cervical adenopathy.  Skin:    General: Skin is warm and dry.  Neurological:     General: No focal deficit present.     Mental Status: She is alert and oriented to person, place, and time.   Psychiatric:        Mood and Affect: Mood normal.        Behavior: Behavior normal.    ASSESSMENT/PLAN:   General counselling and advice on contraception Due to sexual activity without pregnancy prevention, provided education on early pregnancy and importance of daily prenatal prior to conception. Patient will take prenatal daily.  Healthcare maintenance Pap smear collected today. Pt denied complaints.  Hep C collected today for low risk screening. Also obtained hgb A1c for risk factors including race, family history, and obesity. Pt is improving lifestyle and has seen gradual weight loss over past several visits.  Elevated blood pressure reading BP elevated again today and patient remains asymptomatic.  Repeat by provider after sitting in room for several minutes was normalized to 125/85. Recommended ambulatory BP monitoring and patient is agreeable. Will forward chart to pharm team.   Leeroy Bock, DO [facility]

## 2021-01-25 NOTE — Assessment & Plan Note (Signed)
BP elevated again today and patient remains asymptomatic.  Repeat by provider after sitting in room for several minutes was normalized to 125/85. Recommended ambulatory BP monitoring and patient is agreeable. Will forward chart to pharm team.

## 2021-01-25 NOTE — Assessment & Plan Note (Signed)
Due to sexual activity without pregnancy prevention, provided education on early pregnancy and importance of daily prenatal prior to conception. Patient will take prenatal daily.

## 2021-01-26 LAB — HEPATITIS C ANTIBODY: Hep C Virus Ab: <0.1 s/co ratio (ref 0.0–0.9)

## 2021-01-26 LAB — HEMOGLOBIN A1C
Est. average glucose Bld gHb Est-mCnc: 105 mg/dL
Hgb A1c MFr Bld: 5.3 % (ref 4.8–5.6)

## 2021-01-27 LAB — CYTOLOGY - PAP: Diagnosis: NEGATIVE

## 2021-02-15 ENCOUNTER — Encounter: Payer: Self-pay | Admitting: Student in an Organized Health Care Education/Training Program

## 2021-02-15 ENCOUNTER — Encounter: Payer: Self-pay | Admitting: Pharmacist

## 2021-02-15 ENCOUNTER — Ambulatory Visit (INDEPENDENT_AMBULATORY_CARE_PROVIDER_SITE_OTHER): Payer: No Typology Code available for payment source | Admitting: Pharmacist

## 2021-02-15 ENCOUNTER — Other Ambulatory Visit: Payer: Self-pay

## 2021-02-15 ENCOUNTER — Ambulatory Visit (INDEPENDENT_AMBULATORY_CARE_PROVIDER_SITE_OTHER)
Payer: No Typology Code available for payment source | Admitting: Student in an Organized Health Care Education/Training Program

## 2021-02-15 ENCOUNTER — Other Ambulatory Visit (HOSPITAL_COMMUNITY): Payer: Self-pay

## 2021-02-15 DIAGNOSIS — G8929 Other chronic pain: Secondary | ICD-10-CM

## 2021-02-15 DIAGNOSIS — R519 Headache, unspecified: Secondary | ICD-10-CM | POA: Diagnosis not present

## 2021-02-15 DIAGNOSIS — R030 Elevated blood-pressure reading, without diagnosis of hypertension: Secondary | ICD-10-CM | POA: Diagnosis not present

## 2021-02-15 MED ORDER — TOPIRAMATE 25 MG PO TABS
25.0000 mg | ORAL_TABLET | Freq: Two times a day (BID) | ORAL | 0 refills | Status: DC
  Filled 2021-02-15 – 2021-03-01 (×2): qty 60, 30d supply, fill #0

## 2021-02-15 MED ORDER — RIZATRIPTAN BENZOATE 5 MG PO TABS
5.0000 mg | ORAL_TABLET | ORAL | 0 refills | Status: DC | PRN
  Filled 2021-02-15 – 2021-03-01 (×2): qty 18, 30d supply, fill #0

## 2021-02-15 NOTE — Patient Instructions (Signed)
It was a pleasure to see you today!  To summarize our discussion for this visit: For your migraines: We will start a daily controller of topamax For an abortive therapy, I am prescribing rizatriptan to take when your headaches are lasting longer than a few hours These medications are going to work in part with lifestyle changes. It is beneficial to also address sleep problems, stress, and diet as contributing.  For your blood pressure: We will discuss treatment with Dr. Raymondo Band after the results come back with ambulatory blood pressure monitoring and consider if a beta blocker could be a good double therapy to treat both the BP and the migraines.  Some additional health maintenance measures we should update are: Health Maintenance Due  Topic Date Due   HPV VACCINES (1 - 2-dose series) Never done     Call the clinic at [phone] if your symptoms worsen or you have any concerns.   Thank you for allowing me to take part in your care,  Dr. Jamelle Rushing

## 2021-02-15 NOTE — Patient Instructions (Addendum)
It was a pleasure meeting with you today.  START taking Carvedilol 3.125 mg by mouth twice a day.

## 2021-02-15 NOTE — Progress Notes (Signed)
   S:    Patient arrives in good spirits ambulating by herself. Presents to the clinic for ambulatory blood pressure evaluation.   Patient was referred on 01/25/21.  Patient was last seen by Primary Care Provider on 02/15/21.   Discussed procedure for wearing the monitor and gave patient written instructions. Monitor was placed on non-dominant (left) arm with instructions to return in the morning.   Patient reports 1-3 migraines per week, can last several days, endorses light sensitivity, vomiting.   O:  Physical Exam Vitals reviewed.  Neurological:     Mental Status: She is alert.  Psychiatric:        Behavior: Behavior normal.    Review of Systems  All other systems reviewed and are negative.  Last 3 Office BP readings: BP Readings from Last 3 Encounters:  02/15/21 (!) 153/110  02/15/21 (!) 153/110  01/25/21 (!) 134/92    Today's Office Blood Pressure (BP) reading: 153/110 mmHg (manual reading)  ABPM Study Data: Arm Placement left arm  Overall Mean 24hr BP:   160/106 mmHg HR: 80  Daytime Mean BP:  161/110 mmHg HR: 82  Nighttime Mean BP:  157/96 mmHg            HR: 74  Dipping Pattern: No.  Sys:   2.1   Dia: 12.6   [normal dipping ~10-20%]  Non-hypertensive ABPM thresholds: daytime BP <125/75 mmHg, sleeptime BP <120/70 mmHg     A/P: Evaluation for elevated in-office blood pressures in a patient with no previous Hypertension diagnosis. Given 24-hour ambulatory blood pressure demonstrates Stage 2 Primary Essential Hypertension with an average blood pressure of 160/106 mmHg, and a nocturnal dipping pattern that is abnormal (non-dipper).  As this patient has migraines whith are currently frequent and moderately severe, initiate carvedilol 3.125 mg BID, patient counseled on fatigue, monitor for changes in migraine/headache frequency.  Asked patient to return in 4 weeks for blood pressure follow-up.  Plikely increase dose of carvedilol at that time OR consider addition of  alternative agent at that time.   As patient is actively considering becoming pregnant in the next year, ACE/ARB therapy will be deferred at this time.    Results reviewed and written information provided.  Total time in face-to-face counseling 20 minutes.   F/U Clinic Visit with Dr. Raymondo Band in 1 Month.  Patient seen with Wynonia Musty. Rosezella [place], PharmD Candidate

## 2021-02-15 NOTE — Progress Notes (Signed)
    SUBJECTIVE:   CHIEF COMPLAINT / HPI: migraines  Migraines-  3-4x/week for about a month. Lasting up to 72 hours. Just takes tylenol and occasionally ibuprofen.  Associated with nausea, vomiting, photophobia.  Feels sluggish the day prior to headache onset. Frontal and sometimes migrates to left side.  Not positional.  Her work schedule has become a lot more stressful lately and has worsened sleep.  Amitriptyline did help in the past.   HTN- received ambulatory monitor today and is asymptomatic with elevated BP today.  OBJECTIVE:   BP (!) 153/110   Pulse 81   Ht 5' (1.524 m)   Wt 173 lb (78.5 kg)   LMP 02/04/2021   SpO2 100%   BMI 33.79 kg/m   Physical Exam Vitals and nursing note reviewed.  Constitutional:      General: She is not in acute distress.    Appearance: Normal appearance. She is not ill-appearing or toxic-appearing.  HENT:     Head: Normocephalic and atraumatic.     Nose: Nose normal. No congestion.     Mouth/Throat:     Mouth: Mucous membranes are moist.     Pharynx: Oropharynx is clear.  Eyes:     Pupils: Pupils are equal, round, and reactive to light.  Pulmonary:     Effort: Pulmonary effort is normal.  Musculoskeletal:        General: Normal range of motion.     Right lower leg: No edema.     Left lower leg: No edema.  Skin:    General: Skin is warm and dry.  Neurological:     General: No focal deficit present.     Mental Status: She is alert and oriented to person, place, and time. Mental status is at baseline.     GCS: GCS eye subscore is 4. GCS verbal subscore is 5. GCS motor subscore is 6.     Cranial Nerves: Cranial nerves are intact.     Sensory: Sensation is intact.     Motor: No weakness.     Coordination: Coordination is intact. Finger-Nose-Finger Test and Heel to Ogema Test normal.     Gait: Gait is intact.  Psychiatric:        Mood and Affect: Mood normal.        Behavior: Behavior normal.   ASSESSMENT/PLAN:   Chronic  headaches Chronic headaches that have features consistent with tension and migraines. No status migrainosus. Normal neuro exam.  Amitriptyline did improve in the past.  Tylenol and ibuprofen does not help. Frequency, intensity, and duration has been worsening in past month with increased work stress and worsened sleep habits.  Address stress and sleep components.  Add controller- topamax as well as abortive- rizatriptan.  Patient also undergoing HTN evaluation with ambulatory monitor so discussed dual treatment with BB if needing HTN therapy. Will discuss with pharmacy.     Leeroy Bock, DO [facility]

## 2021-02-16 ENCOUNTER — Other Ambulatory Visit (HOSPITAL_COMMUNITY): Payer: Self-pay

## 2021-02-16 MED ORDER — CARVEDILOL 3.125 MG PO TABS
3.1250 mg | ORAL_TABLET | Freq: Two times a day (BID) | ORAL | 1 refills | Status: DC
  Filled 2021-02-16 – 2021-03-01 (×2): qty 180, 90d supply, fill #0

## 2021-02-16 NOTE — Assessment & Plan Note (Addendum)
Chronic headaches that have features consistent with tension and migraines. No status migrainosus. Normal neuro exam.  Amitriptyline did improve in the past.  Tylenol and ibuprofen does not help. Frequency, intensity, and duration has been worsening in past month with increased work stress and worsened sleep habits.  Address stress and sleep components.  Add controller- topamax as well as abortive- rizatriptan.  Patient also undergoing HTN evaluation with ambulatory monitor so discussed dual treatment with BB if needing HTN therapy. Will discuss with pharmacy.

## 2021-02-16 NOTE — Assessment & Plan Note (Signed)
Evaluation for elevated in-office blood pressures in a patient with no previous Hypertension diagnosis. Given 24-hour ambulatory blood pressure demonstrates Stage 2 Primary Essential Hypertension with an average blood pressure of 160/106 mmHg, and a nocturnal dipping pattern that is abnormal (non-dipper).  As this patient has migraines whith are currently frequent and moderately severe, initiate carvedilol 3.125 mg BID, patient counseled on fatigue, monitor for changes in migraine/headache frequency.  Asked patient to return in 4 weeks for blood pressure follow-up.  Plikely increase dose of carvedilol at that time OR consider addition of alternative agent at that time.   As patient is actively considering becoming pregnant in the next year, ACE/ARB therapy will be deferred at this time.

## 2021-02-18 ENCOUNTER — Other Ambulatory Visit (HOSPITAL_COMMUNITY): Payer: Self-pay

## 2021-02-22 NOTE — Progress Notes (Signed)
Reviewed: I agree with Dr. Koval's documentation and management. 

## 2021-02-24 ENCOUNTER — Other Ambulatory Visit (HOSPITAL_COMMUNITY): Payer: Self-pay

## 2021-02-25 ENCOUNTER — Other Ambulatory Visit (HOSPITAL_COMMUNITY): Payer: Self-pay

## 2021-03-01 ENCOUNTER — Other Ambulatory Visit (HOSPITAL_COMMUNITY): Payer: Self-pay

## 2021-03-08 ENCOUNTER — Other Ambulatory Visit (HOSPITAL_COMMUNITY): Payer: Self-pay

## 2021-03-16 ENCOUNTER — Other Ambulatory Visit (INDEPENDENT_AMBULATORY_CARE_PROVIDER_SITE_OTHER): Payer: Self-pay | Admitting: Primary Care

## 2021-03-16 DIAGNOSIS — Z3201 Encounter for pregnancy test, result positive: Secondary | ICD-10-CM

## 2021-03-17 LAB — BETA HCG QUANT (REF LAB): hCG Quant: 4562 m[IU]/mL

## 2021-03-24 ENCOUNTER — Ambulatory Visit (INDEPENDENT_AMBULATORY_CARE_PROVIDER_SITE_OTHER): Payer: No Typology Code available for payment source | Admitting: Family Medicine

## 2021-03-24 ENCOUNTER — Other Ambulatory Visit (HOSPITAL_COMMUNITY): Payer: Self-pay

## 2021-03-24 ENCOUNTER — Other Ambulatory Visit: Payer: Self-pay

## 2021-03-24 VITALS — BP 150/101 | HR 79 | Ht 60.0 in | Wt 172.8 lb

## 2021-03-24 DIAGNOSIS — Z3200 Encounter for pregnancy test, result unknown: Secondary | ICD-10-CM | POA: Diagnosis not present

## 2021-03-24 DIAGNOSIS — O0990 Supervision of high risk pregnancy, unspecified, unspecified trimester: Secondary | ICD-10-CM | POA: Insufficient documentation

## 2021-03-24 LAB — POCT URINE PREGNANCY: Preg Test, Ur: POSITIVE — AB

## 2021-03-24 MED ORDER — LABETALOL HCL 100 MG PO TABS
100.0000 mg | ORAL_TABLET | Freq: Two times a day (BID) | ORAL | 3 refills | Status: DC
  Filled 2021-03-24 – 2021-04-14 (×2): qty 180, 90d supply, fill #0
  Filled 2021-08-24: qty 180, 90d supply, fill #1

## 2021-03-24 NOTE — Assessment & Plan Note (Addendum)
Patient presents today because she took a home pregnancy test and it was positive.  Estimated gestational age of [redacted] weeks and 6 days.  Reports that she was taking multiple medications for headaches including a triptan, Topamax, amitriptyline.  She is also taking carvedilol for blood pressure issues.  She has not taken her carvedilol in a week and has also not needed to take her headache medications.  Discontinued all of her headache medications due to not being safe in pregnancy.  Transition from carvedilol to labetalol 100 mg twice daily.  Also collected initial prenatal labs.  Because of the patient's chronic hypertension she meets criteria for high risk OB clinic and a referral has been placed for this.  They will call to schedule the appointment and she will continue her prenatal care with a OB provider.

## 2021-03-24 NOTE — Progress Notes (Signed)
    SUBJECTIVE:   CHIEF COMPLAINT / HPI:   Confirm pregnancy Patient reports that her last menstrual cycle was 02/04/2021.  Before this they were completely regular.  She took a home pregnancy test and it came back positive.  When she found out she was pregnant she stopped taking her headache medications and has not taken her blood pressure medication last week.  She does have history of hypertension which was diagnosed in June and she was recently started on carvedilol.  Denies any vaginal bleeding, gush of fluid, contractions.  She does report intermittent cramping.  OBJECTIVE:   BP (!) 150/101   Pulse 79   Ht 5' (1.524 m)   Wt 172 lb 12.8 oz (78.4 kg)   LMP 02/04/2021 (Exact Date)   SpO2 97%   BMI 33.75 kg/m   General: Well-appearing 25 year old female, no acute distress Cardiac: Regular rate and rhythm, no murmur appreciated Respiratory: Normal work of breathing, lungs clear to auscultation bilaterally Abdomen: Soft, nontender, positive bowel sounds MSK: No gross abnormalities  ASSESSMENT/PLAN:   Supervision of high risk pregnancy, antepartum Patient presents today because she took a home pregnancy test and it was positive.  Estimated gestational age of [redacted] weeks and 6 days.  Reports that she was taking multiple medications for headaches including a triptan, Topamax, amitriptyline.  She is also taking carvedilol for blood pressure issues.  She has not taken her carvedilol in a week and has also not needed to take her headache medications.  Discontinued all of her headache medications due to not being safe in pregnancy.  Transition from carvedilol to labetalol 100 mg twice daily.  Also collected initial prenatal labs.  Because of the patient's chronic hypertension she meets criteria for high risk OB clinic and a referral has been placed for this.  They will call to schedule the appointment and she will continue her prenatal care with a OB provider.     Derrel Nip, MD [facility]  [facility]

## 2021-03-24 NOTE — Patient Instructions (Signed)
It was great seeing you today.  Regarding your medications he need to discontinue your headache medications.  Regarding her blood pressure medication I want to switch you from carvedilol to labetalol.  I have sent this medication to your pharmacy.  I have also placed an order for you to be seen by high risk OB given your pre-existing hypertension.  Someone should be calling you to schedule your initial OB visit.  If you have any questions or concerns please call the clinic.  If you have any vaginal bleeding, gush of fluid, contractions please be evaluated in the maternal assessment unit in the bottom of the [facility].  I hope you have a wonderful afternoon!   Safe Medications in Pregnancy   Acne:  Benzoyl Peroxide  Salicylic Acid   Backache/Headache:  Tylenol: 2 regular strength every 4 hours OR               2 Extra strength every 6 hours   Colds/Coughs/Allergies:  Benadryl (alcohol free) 25 mg every 6 hours as needed  Breath right strips  Claritin  Cepacol throat lozenges  Chloraseptic throat spray  Cold-Eeze- up to three times per day  Cough drops, alcohol free  Flonase (by prescription only)  Guaifenesin  Mucinex  Robitussin DM (plain only, alcohol free)  Saline nasal spray/drops  Sudafed (pseudoephedrine) & Actifed * use only after [redacted] weeks gestation and if you do not have high blood pressure  Tylenol  Vicks Vaporub  Zinc lozenges  Zyrtec   Constipation:  Colace  Ducolax suppositories  Fleet enema  Glycerin suppositories  Metamucil  Milk of magnesia  Miralax  Senokot  Smooth move tea   Diarrhea:  Kaopectate  Imodium A-D   *NO pepto Bismol   Hemorrhoids:  Anusol  Anusol HC  Preparation H  Tucks   Indigestion:  Tums  Maalox  Mylanta  Zantac  Pepcid   Insomnia:  Benadryl (alcohol free) 25mg  every 6 hours as needed  Tylenol PM  Unisom, no Gelcaps   Leg Cramps:  Tums  MagGel   Nausea/Vomiting:  Bonine  Dramamine  Emetrol  Ginger  extract  Sea bands  Meclizine  Nausea medication to take during pregnancy:  Unisom (doxylamine succinate 25 mg tablets) Take one tablet daily at bedtime. If symptoms are not adequately controlled, the dose can be increased to a maximum recommended dose of two tablets daily (1/2 tablet in the morning, 1/2 tablet mid-afternoon and one at bedtime).  Vitamin B6 100mg  tablets. Take one tablet twice a day (up to 200 mg per day).   Skin Rashes:  Aveeno products  Benadryl cream or 25mg  every 6 hours as needed  Calamine Lotion  1% cortisone cream   Yeast infection:  Gyne-lotrimin 7  Monistat 7    **If taking multiple medications, please check labels to avoid duplicating the same active ingredients  **take medication as directed on the label  ** Do not exceed 4000 mg of tylenol in 24 hours  **Do not take medications that contain aspirin or ibuprofen

## 2021-03-26 LAB — URINE CULTURE, OB REFLEX

## 2021-03-26 LAB — CULTURE, OB URINE

## 2021-03-29 LAB — OBSTETRIC PANEL, INCLUDING HIV
Antibody Screen: NEGATIVE
Basophils Absolute: 0.1 10*3/uL (ref 0.0–0.2)
Basos: 1 %
EOS (ABSOLUTE): 0.1 10*3/uL (ref 0.0–0.4)
Eos: 1 %
HIV Screen 4th Generation wRfx: NONREACTIVE
Hematocrit: 40.1 % (ref 34.0–46.6)
Hemoglobin: 13.1 g/dL (ref 11.1–15.9)
Hepatitis B Surface Ag: NEGATIVE
Immature Grans (Abs): 0 10*3/uL (ref 0.0–0.1)
Immature Granulocytes: 0 %
Lymphocytes Absolute: 1.9 10*3/uL (ref 0.7–3.1)
Lymphs: 28 %
MCH: 30.1 pg (ref 26.6–33.0)
MCHC: 32.7 g/dL (ref 31.5–35.7)
MCV: 92 fL (ref 79–97)
Monocytes Absolute: 0.5 10*3/uL (ref 0.1–0.9)
Monocytes: 7 %
Neutrophils Absolute: 4.3 10*3/uL (ref 1.4–7.0)
Neutrophils: 63 %
Platelets: 297 10*3/uL (ref 150–450)
RBC: 4.35 x10E6/uL (ref 3.77–5.28)
RDW: 13.1 % (ref 11.7–15.4)
RPR Ser Ql: NONREACTIVE
Rh Factor: POSITIVE
Rubella Antibodies, IGG: 8.5 index (ref >0.99)
WBC: 6.8 10*3/uL (ref 3.4–10.8)

## 2021-03-29 LAB — HGB FRACTIONATION CASCADE
Hgb A2: 2.6 % (ref 1.8–3.2)
Hgb A: 97.4 % (ref 96.4–98.8)
Hgb F: 0 % (ref 0.0–2.0)
Hgb S: 0 %

## 2021-03-29 LAB — HCV AB W REFLEX TO QUANT PCR: HCV Ab: <0.1 s/co ratio (ref 0.0–0.9)

## 2021-03-29 LAB — HCV INTERPRETATION

## 2021-04-01 ENCOUNTER — Other Ambulatory Visit (HOSPITAL_COMMUNITY): Payer: Self-pay

## 2021-04-13 ENCOUNTER — Encounter (HOSPITAL_COMMUNITY): Payer: Self-pay | Admitting: Obstetrics & Gynecology

## 2021-04-13 ENCOUNTER — Other Ambulatory Visit (HOSPITAL_COMMUNITY): Payer: Self-pay

## 2021-04-13 ENCOUNTER — Inpatient Hospital Stay (HOSPITAL_COMMUNITY)
Admission: AD | Admit: 2021-04-13 | Discharge: 2021-04-13 | Disposition: A | Discharge status: Home / Self Care | Payer: No Typology Code available for payment source | Attending: Obstetrics & Gynecology | Admitting: Obstetrics & Gynecology

## 2021-04-13 ENCOUNTER — Other Ambulatory Visit: Payer: Self-pay

## 2021-04-13 ENCOUNTER — Inpatient Hospital Stay (HOSPITAL_COMMUNITY): Payer: No Typology Code available for payment source

## 2021-04-13 DIAGNOSIS — O10911 Unspecified pre-existing hypertension complicating pregnancy, first trimester: Secondary | ICD-10-CM | POA: Insufficient documentation

## 2021-04-13 DIAGNOSIS — O0991 Supervision of high risk pregnancy, unspecified, first trimester: Secondary | ICD-10-CM | POA: Insufficient documentation

## 2021-04-13 DIAGNOSIS — Z3A09 9 weeks gestation of pregnancy: Secondary | ICD-10-CM | POA: Insufficient documentation

## 2021-04-13 DIAGNOSIS — T148XXA Other injury of unspecified body region, initial encounter: Secondary | ICD-10-CM | POA: Insufficient documentation

## 2021-04-13 DIAGNOSIS — R1030 Lower abdominal pain, unspecified: Secondary | ICD-10-CM | POA: Diagnosis not present

## 2021-04-13 DIAGNOSIS — O10919 Unspecified pre-existing hypertension complicating pregnancy, unspecified trimester: Secondary | ICD-10-CM | POA: Diagnosis present

## 2021-04-13 DIAGNOSIS — R109 Unspecified abdominal pain: Secondary | ICD-10-CM

## 2021-04-13 DIAGNOSIS — M7918 Myalgia, other site: Secondary | ICD-10-CM

## 2021-04-13 DIAGNOSIS — O26891 Other specified pregnancy related conditions, first trimester: Secondary | ICD-10-CM | POA: Insufficient documentation

## 2021-04-13 DIAGNOSIS — O119 Pre-existing hypertension with pre-eclampsia, unspecified trimester: Secondary | ICD-10-CM | POA: Diagnosis present

## 2021-04-13 HISTORY — DX: Essential (primary) hypertension: I10

## 2021-04-13 LAB — URINALYSIS, ROUTINE W REFLEX MICROSCOPIC
Bilirubin Urine: NEGATIVE
Glucose, UA: NEGATIVE mg/dL
Hgb urine dipstick: NEGATIVE
Ketones, ur: NEGATIVE mg/dL
Leukocytes,Ua: NEGATIVE
Nitrite: NEGATIVE
Protein, ur: NEGATIVE mg/dL
Specific Gravity, Urine: 1.011 (ref 1.005–1.030)
pH: 6 (ref 5.0–8.0)

## 2021-04-13 LAB — WET PREP, GENITAL
Clue Cells Wet Prep HPF POC: NONE SEEN
Sperm: NONE SEEN
Trich, Wet Prep: NONE SEEN
Yeast Wet Prep HPF POC: NONE SEEN

## 2021-04-13 LAB — COMPREHENSIVE METABOLIC PANEL
ALT: 14 U/L (ref 0–44)
AST: 18 U/L (ref 15–41)
Albumin: 3.3 g/dL — ABNORMAL LOW (ref 3.5–5.0)
Alkaline Phosphatase: 58 U/L (ref 38–126)
Anion gap: 6 (ref 5–15)
BUN: 7 mg/dL (ref 6–20)
CO2: 24 mmol/L (ref 22–32)
Calcium: 9 mg/dL (ref 8.9–10.3)
Chloride: 105 mmol/L (ref 98–111)
Creatinine, Ser: 0.57 mg/dL (ref 0.44–1.00)
GFR, Estimated: >60 mL/min (ref >60)
Glucose, Bld: 102 mg/dL — ABNORMAL HIGH (ref 70–99)
Potassium: 3.6 mmol/L (ref 3.5–5.1)
Sodium: 135 mmol/L (ref 135–145)
Total Bilirubin: 0.4 mg/dL (ref 0.3–1.2)
Total Protein: 6.9 g/dL (ref 6.5–8.1)

## 2021-04-13 LAB — CBC
HCT: 38.1 % (ref 36.0–46.0)
Hemoglobin: 12.8 g/dL (ref 12.0–15.0)
MCH: 30.3 pg (ref 26.0–34.0)
MCHC: 33.6 g/dL (ref 30.0–36.0)
MCV: 90.3 fL (ref 80.0–100.0)
Platelets: 318 10*3/uL (ref 150–400)
RBC: 4.22 MIL/uL (ref 3.87–5.11)
RDW: 12.8 % (ref 11.5–15.5)
WBC: 7.7 10*3/uL (ref 4.0–10.5)
nRBC: 0 % (ref 0.0–0.2)

## 2021-04-13 LAB — PROTEIN / CREATININE RATIO, URINE
Creatinine, Urine: 65.81 mg/dL
Protein Creatinine Ratio: 0.15 mg/mg{Cre} (ref 0.00–0.15)
Total Protein, Urine: 10 mg/dL

## 2021-04-13 MED ORDER — LABETALOL HCL 100 MG PO TABS
100.0000 mg | ORAL_TABLET | Freq: Once | ORAL | Status: AC
  Administered 2021-04-13: 100 mg via ORAL
  Filled 2021-04-13: qty 1

## 2021-04-13 MED ORDER — CYCLOBENZAPRINE HCL 10 MG PO TABS
10.0000 mg | ORAL_TABLET | Freq: Two times a day (BID) | ORAL | 0 refills | Status: DC | PRN
  Filled 2021-04-13: qty 20, 10d supply, fill #0

## 2021-04-13 NOTE — MAU Provider Note (Signed)
History     CSN: [id number]  Arrival date and time: 04/13/21 1255   Event Date/Time   First Provider Initiated Contact with Patient 04/13/21 1335      Chief Complaint  Patient presents with   Abdominal Pain   [name] is a 25 y.o. year old G43P2002 female at [redacted]w[redacted]d weeks gestation who presents to MAU reporting slipped on a hanger on the floor as she was carrying laundry bag full of clothes from laundry room to bedroom. She moved the laundry bag on her LT side so she wouldn't fall directly on her bottom. After the fall, she started having lower abdominal cramping; mostly on the RT side. She denies hitting her head or any other trauma to her body. She thinks the lower abdominal pain came from the abrupt twisting she did while falling. She has not taken any medication for the pain. She became worried when the pain did not get any better today. She denies any VB or abnormal vaginal discharge. She reports SI 2 days ago. She started [facility] with [facility], but was transferred to [facility] because of high risk pregnancy status. Her pregnancy is complicated by: cHTN on meds and h/o migraines. She takes Labetalol 100 mg twice daily (@ 1300 and 2000), but missed her first dose today. Her mother is present and contributing to the history taking.    OB History     Gravida  3   Para  2   Term  2   Preterm      AB      Living  2      SAB      IAB      Ectopic      Multiple      Live Births  2           Past Medical History:  Diagnosis Date   Hypertension     Past Surgical History:  Procedure Laterality Date   NO PAST SURGERIES      Family History  Problem Relation Age of Onset   Seizures Mother    High Cholesterol Father    Hypertension Father    Diabetes Father     Social History   Tobacco Use   Smoking status: Never   Smokeless tobacco: Never  Vaping Use   Vaping Use: Never used  Substance Use Topics   Alcohol use: No   Drug use:  No    Allergies:  Allergies  Allergen Reactions   Mushroom Extract Complex Swelling    Angioedema (tongue and lips, trouble breathing, treated w/epi pen)    No medications prior to admission.    Review of Systems  Constitutional: Negative.   HENT: Negative.    Eyes: Negative.   Respiratory: Negative.    Cardiovascular: Negative.   Gastrointestinal: Negative.   Endocrine: Negative.   Genitourinary:  Positive for pelvic pain (lower cramping pain, mostly on RT side; rated 5/10). Negative for vaginal bleeding and vaginal discharge.  Musculoskeletal: Negative.   Skin: Negative.   Allergic/Immunologic: Negative.   Neurological: Negative.   Hematological: Negative.   Psychiatric/Behavioral: Negative.     Physical Exam   Patient Vitals for the past 24 hrs:  BP Temp Temp src Pulse Resp SpO2 Height Weight  04/13/21 1704 125/75 97.9 F (36.6 C) Oral 86 20 97 % -- --  04/13/21 1544 130/82 -- -- 75 -- -- -- --  04/13/21 1327 (!) 145/95 -- -- 87 19 99 % -- --  04/13/21 1313 (!) 140/99 98.5 F (36.9 C) Oral 76 16 97 % -- --  04/13/21 1310 -- -- -- -- -- -- 5' (1.524 m) 79.2 kg   Physical Exam Vitals and nursing note reviewed. Exam conducted with a chaperone present.  Constitutional:      Appearance: Normal appearance. She is obese.  HENT:     Head: Normocephalic and atraumatic.  Cardiovascular:     Rate and Rhythm: Normal rate.     Pulses: Normal pulses.  Pulmonary:     Effort: Pulmonary effort is normal.  Abdominal:     Palpations: Abdomen is soft.  Genitourinary:    General: Normal vulva.     Comments: Pelvic exam: External genitalia normal, SE: vaginal walls pink and well rugated, cervix is smooth, pink, no lesions, scant amt of thick, yellowish-white vaginal d/c -- WP, GC/CT done, cervix closed/thick/firm; Uterus is mildly tender, mild CMT, no friability, mild bilateral adnexal tenderness; R>L.  Musculoskeletal:        General: Normal range of motion.  Skin:     General: Skin is warm and dry.  Neurological:     Mental Status: She is alert and oriented to person, place, and time.  Psychiatric:        Mood and Affect: Mood normal.        Behavior: Behavior normal.        Thought Content: Thought content normal.        Judgment: Judgment normal.    MAU Course  Procedures  MDM CCUA Wet Prep GC/CT -- Results pending   Results for orders placed or performed during the hospital encounter of 04/13/21 (from the past 24 hour(s))  Urinalysis, Routine w reflex microscopic Urine, Clean Catch     Status: None   Collection Time: 04/13/21  1:41 PM  Result Value Ref Range   Color, Urine YELLOW YELLOW   APPearance CLEAR CLEAR   Specific Gravity, Urine 1.011 1.005 - 1.030   pH 6.0 5.0 - 8.0   Glucose, UA NEGATIVE NEGATIVE mg/dL   Hgb urine dipstick NEGATIVE NEGATIVE   Bilirubin Urine NEGATIVE NEGATIVE   Ketones, ur NEGATIVE NEGATIVE mg/dL   Protein, ur NEGATIVE NEGATIVE mg/dL   Nitrite NEGATIVE NEGATIVE   Leukocytes,Ua NEGATIVE NEGATIVE  Protein / creatinine ratio, urine     Status: None   Collection Time: 04/13/21  1:42 PM  Result Value Ref Range   Creatinine, Urine 65.81 mg/dL   Total Protein, Urine 10 mg/dL   Protein Creatinine Ratio 0.15 0.00 - 0.15 mg/mg[Cre]  CBC     Status: None   Collection Time: 04/13/21  1:45 PM  Result Value Ref Range   WBC 7.7 4.0 - 10.5 K/uL   RBC 4.22 3.87 - 5.11 MIL/uL   Hemoglobin 12.8 12.0 - 15.0 g/dL   HCT 09.6 28.3 - 66.2 %   MCV 90.3 80.0 - 100.0 fL   MCH 30.3 26.0 - 34.0 pg   MCHC 33.6 30.0 - 36.0 g/dL   RDW 94.7 65.4 - 65.0 %   Platelets 318 150 - 400 K/uL   nRBC 0.0 0.0 - 0.2 %  Comprehensive metabolic panel     Status: Abnormal   Collection Time: 04/13/21  1:45 PM  Result Value Ref Range   Sodium 135 135 - 145 mmol/L   Potassium 3.6 3.5 - 5.1 mmol/L   Chloride 105 98 - 111 mmol/L   CO2 24 22 - 32 mmol/L   Glucose, Bld 102 (H) 70 - 99  mg/dL   BUN 7 6 - 20 mg/dL   Creatinine, Ser 1.61 0.44 -  1.00 mg/dL   Calcium 9.0 8.9 - 09.6 mg/dL   Total Protein 6.9 6.5 - 8.1 g/dL   Albumin 3.3 (L) 3.5 - 5.0 g/dL   AST 18 15 - 41 U/L   ALT 14 0 - 44 U/L   Alkaline Phosphatase 58 38 - 126 U/L   Total Bilirubin 0.4 0.3 - 1.2 mg/dL   GFR, Estimated >04 >54 mL/min   Anion gap 6 5 - 15  Wet prep, genital     Status: Abnormal   Collection Time: 04/13/21  1:54 PM   Specimen: Cervix  Result Value Ref Range   Yeast Wet Prep HPF POC NONE SEEN NONE SEEN   Trich, Wet Prep NONE SEEN NONE SEEN   Clue Cells Wet Prep HPF POC NONE SEEN NONE SEEN   WBC, Wet Prep HPF POC MANY (A) NONE SEEN   Sperm NONE SEEN     US OB Comp Less 14 Wks  Result Date: 04/13/2021 CLINICAL DATA:  Pain in early pregnancy EXAM: OBSTETRIC <14 WK ULTRASOUND TECHNIQUE: Transabdominal ultrasound was performed for evaluation of the gestation as well as the maternal uterus and adnexal regions. COMPARISON:  None. FINDINGS: Intrauterine gestational sac: Single Yolk sac:  Not visualized Embryo:  Visualized Cardiac Activity: Visualized Heart Rate: 167 bpm CRL:   28 mm   9 w 4 d                  [place] EDC: 11/12/2021 Maternal uterus/adnexae: Subchorionic hemorrhage: None Right ovary: Normal Left ovary: Normal Other :None Free fluid:  None IMPRESSION: Single living intrauterine gestation with an estimated gestational age of [redacted] weeks and 4 days. No complicating features identified. Electronically Signed   By: Signa Kell M.D.   On: 04/13/2021 16:34    Assessment and Plan  1. Chronic hypertension affecting pregnancy - Advised to make sure to space Labetalol 12 hours (ie. 0800 and 2000) - Information provided on BP form and HTN in pregnancy   2. Abdominal pain during pregnancy in first trimester - Information provided on abd pain in pregnancy  - Reassurance given that U/S was normal  3. Muscle strain - Rx for Flexeril 10 mg BID prn pain - Safe Meds in Pg list given  4. [redacted] weeks gestation of pregnancy  - Discharge patient - Keep scheduled  appt on 8/29 & 9/13 - Patient verbalized an understanding of the plan of care and agrees.    Raelyn Mora, CNM 04/13/2021, 3:41 PM

## 2021-04-13 NOTE — Discharge Instructions (Signed)

## 2021-04-13 NOTE — MAU Note (Signed)
Brittney Cummings is a 25 y.o. at [redacted]w[redacted]d here in MAU reporting: slipped on a hanger yesterday and has been cramping since. States cramping is mostly on the right side. No bleeding or discharge. Has not tried anything for pain at home.   Onset of complaint: yesterday  Pain score: 5/10  Vitals:   04/13/21 1313  BP: (!) 140/99  Pulse: 76  Resp: 16  Temp: 98.5 F (36.9 C)  SpO2: 97%     Lab orders placed from triage: UA

## 2021-04-14 ENCOUNTER — Other Ambulatory Visit (HOSPITAL_COMMUNITY): Payer: Self-pay

## 2021-04-14 LAB — GC/CHLAMYDIA PROBE AMP (~~LOC~~) NOT AT ARMC
Chlamydia: NEGATIVE
Comment: NEGATIVE
Comment: NORMAL
Neisseria Gonorrhea: NEGATIVE

## 2021-04-18 ENCOUNTER — Ambulatory Visit (INDEPENDENT_AMBULATORY_CARE_PROVIDER_SITE_OTHER): Payer: No Typology Code available for payment source | Admitting: *Deleted

## 2021-04-18 ENCOUNTER — Telehealth: Payer: No Typology Code available for payment source

## 2021-04-18 ENCOUNTER — Other Ambulatory Visit: Payer: Self-pay

## 2021-04-18 VITALS — BP 127/101 | HR 76 | Temp 98.0°F | Wt 174.6 lb

## 2021-04-18 DIAGNOSIS — O0990 Supervision of high risk pregnancy, unspecified, unspecified trimester: Secondary | ICD-10-CM

## 2021-04-18 NOTE — Progress Notes (Signed)
   Location: [facility] Patient: clinic Provider: clinic  PRENATAL INTAKE SUMMARY  [name] presents today New OB Nurse Interview.  OB History     Gravida  3   Para  2   Term  2   Preterm      AB      Living  2      SAB      IAB      Ectopic      Multiple      Live Births  2          I have reviewed the patient's medical, obstetrical, social, and family histories, medications, and available lab results.  SUBJECTIVE She has no unusual complaints  OBJECTIVE Initial Nurse interview for history/labs (New OB)  EDD: 11/11/2020 by LMP GA: [redacted]w[redacted]d G3P2002 FHT: not assessed  GENERAL APPEARANCE: alert, well appearing, in no apparent distress, oriented to person, place and time   ASSESSMENT Normal pregnancy  PLAN Prenatal care:  [facility] HgbEval/SMA/CF ([facility]) [facility] were completed on 03/24/2021 and 04/13/2021 Continue PNV Sign up for Babyscripts BP monitor given and weight scale at home Continue BP medication PAP completed 01/25/2021 WNL   Follow Up Instructions:   I discussed the assessment and treatment plan with the patient. The patient was provided an opportunity to ask questions and all were answered. The patient agreed with the plan and demonstrated an understanding of the instructions.   The patient was advised to call back or seek an in-person evaluation if the symptoms worsen or if the condition fails to improve as anticipated.  I provided 30 minutes of  face-to-face time during this encounter.  Clovis Pu, RN

## 2021-04-21 ENCOUNTER — Other Ambulatory Visit (HOSPITAL_COMMUNITY): Payer: Self-pay

## 2021-04-21 ENCOUNTER — Ambulatory Visit (INDEPENDENT_AMBULATORY_CARE_PROVIDER_SITE_OTHER): Payer: No Typology Code available for payment source | Admitting: Obstetrics and Gynecology

## 2021-04-21 ENCOUNTER — Encounter: Payer: Self-pay | Admitting: Obstetrics and Gynecology

## 2021-04-21 ENCOUNTER — Other Ambulatory Visit: Payer: Self-pay

## 2021-04-21 VITALS — BP 142/97 | HR 86 | Temp 98.0°F | Wt 174.2 lb

## 2021-04-21 DIAGNOSIS — Z3A10 10 weeks gestation of pregnancy: Secondary | ICD-10-CM

## 2021-04-21 DIAGNOSIS — O10919 Unspecified pre-existing hypertension complicating pregnancy, unspecified trimester: Secondary | ICD-10-CM

## 2021-04-21 DIAGNOSIS — O0990 Supervision of high risk pregnancy, unspecified, unspecified trimester: Secondary | ICD-10-CM

## 2021-04-21 DIAGNOSIS — O99210 Obesity complicating pregnancy, unspecified trimester: Secondary | ICD-10-CM | POA: Insufficient documentation

## 2021-04-21 DIAGNOSIS — O99211 Obesity complicating pregnancy, first trimester: Secondary | ICD-10-CM

## 2021-04-21 MED ORDER — LABETALOL HCL 200 MG PO TABS
200.0000 mg | ORAL_TABLET | Freq: Two times a day (BID) | ORAL | 6 refills | Status: DC
  Filled 2021-04-21: qty 60, 30d supply, fill #0

## 2021-04-21 NOTE — Progress Notes (Signed)
INITIAL OBSTETRICAL VISIT Patient name: Brittney Cummings MRN [id number]  Date of birth: Jul 14, 1996 Chief Complaint:   Initial Prenatal Visit  History of Present Illness:   Brittney Cummings is a 25 y.o. G44P2002 Hispanic female at [redacted]w[redacted]d by LMP with an Estimated Date of Delivery: 11/11/21 being seen today for her initial obstetrical visit.  Her obstetrical history is significant for obesity and cHTN . This is a planned pregnancy. She and the father of the baby (FOB)/spouse "Brittney Cummings" live together. She has a support system that consists of spouse/her family/friends. Today she reports no complaints.   Patient's last menstrual period was 02/04/2021 (exact date). Last pap 01/25/2021. Results were: normal Review of Systems:   Pertinent items are noted in HPI Denies cramping/contractions, leakage of fluid, vaginal bleeding, abnormal vaginal discharge w/ itching/odor/irritation, headaches, visual changes, shortness of breath, chest pain, abdominal pain, severe nausea/vomiting, or problems with urination or bowel movements unless otherwise stated above.  Pertinent History Reviewed:  Reviewed past medical,surgical, social, obstetrical and family history.  Reviewed problem list, medications and allergies. 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       Physical Examination:  General appearance - well appearing, and in no distress  Mental  status - alert, oriented to person, place, and time  Psych:  She has a normal mood and affect  Skin - warm and dry, normal color, no suspicious lesions noted  Chest - effort normal, all lung fields clear to auscultation bilaterally  Heart - normal rate and regular rhythm  Abdomen - soft, nontender  Extremities:  No swelling or varicosities noted  Pelvic - deferred   FHTs by doppler: 165 bpm  Assessment & Plan:  1) High-Risk Pregnancy G3P2002 at [redacted]w[redacted]d with an Estimated Date of Delivery: 11/11/21   2) Initial OB visit - Welcomed to practice and introduced self to patient in addition to discussing other advanced practice providers that she may be seeing at this practice - Congratulated patient - Anticipatory guidance on upcoming appointments - Educated on COVID19 and pregnancy and the integration of virtual appointments  - Educated on babyscripts app- patient reports she has not received email, encouraged to look in spam folder and to call office if she still has not received email - patient verbalizes understanding    3) Supervision of high risk pregnancy, antepartum - 11/13/21 MFM OB DETAIL +14 WK; Future  2. Chronic hypertension affecting pregnancy - Consult with Dr. [place] @ [phone] - notified of patient's dx of cHTN on Labetalol 100 mg BID, but BPs are not regulated - tx plan: increase Labetalol from 100 mg BID to 200 mg BID --  agrees with plan - [place] MFM OB DETAIL +14 WK; Future - Rx for Labetalol 200 mg BID - advised to double up on 100 mg tablets until they run out; starting with her evening dose today   3. Obesity affecting pregnancy in first trimester - [place] MFM OB DETAIL +14 WK; Future  4. [redacted] weeks gestation of pregnancy    Meds: No orders of the defined types were placed in this encounter.   Initial labs obtained Continue prenatal vitamins Reviewed n/v relief measures and warning s/s to report Reviewed recommended weight gain based on pre-gravid BMI Encouraged well-balanced  diet Genetic Screening discussed: ordered Cystic fibrosis, SMA, Fragile X screening discussed ordered The nature of [facility] with multiple MDs and other Advanced Practice Providers was explained to patient; also emphasized that residents, students are part of our team.  Discussed optimized OB schedule and video visits. Advised can have an in-office visit whenever she feels she needs to be seen.  Does have own BP cuff. Explained to patient that BP will be mailed to her house. Check BP weekly, let us know if >140/90. Advised to call during normal business hours and there is an after-hours nurse line available.    Follow-up: No follow-ups on file.   Orders Placed This Encounter  Procedures   [place] MFM OB DETAIL +14 WK    Raelyn Mora MSN, [place] 04/21/2021

## 2021-04-29 ENCOUNTER — Other Ambulatory Visit (HOSPITAL_COMMUNITY): Payer: Self-pay

## 2021-05-03 ENCOUNTER — Encounter: Payer: No Typology Code available for payment source | Admitting: Family Medicine

## 2021-05-10 ENCOUNTER — Other Ambulatory Visit: Payer: Self-pay

## 2021-05-10 DIAGNOSIS — O0990 Supervision of high risk pregnancy, unspecified, unspecified trimester: Secondary | ICD-10-CM

## 2021-05-10 NOTE — Progress Notes (Signed)
    Panorama  Redraw 05/10/2021 Due to insufficient fetal DNA   Gearldine Shown, CMA

## 2021-05-19 ENCOUNTER — Ambulatory Visit (INDEPENDENT_AMBULATORY_CARE_PROVIDER_SITE_OTHER): Payer: No Typology Code available for payment source | Admitting: Obstetrics and Gynecology

## 2021-05-19 ENCOUNTER — Other Ambulatory Visit: Payer: Self-pay

## 2021-05-19 ENCOUNTER — Encounter: Payer: Self-pay | Admitting: Obstetrics and Gynecology

## 2021-05-19 VITALS — BP 119/84 | HR 87 | Wt 173.0 lb

## 2021-05-19 DIAGNOSIS — O99211 Obesity complicating pregnancy, first trimester: Secondary | ICD-10-CM

## 2021-05-19 DIAGNOSIS — O10919 Unspecified pre-existing hypertension complicating pregnancy, unspecified trimester: Secondary | ICD-10-CM

## 2021-05-19 DIAGNOSIS — O0990 Supervision of high risk pregnancy, unspecified, unspecified trimester: Secondary | ICD-10-CM

## 2021-05-19 DIAGNOSIS — Z3A14 14 weeks gestation of pregnancy: Secondary | ICD-10-CM

## 2021-05-19 NOTE — Progress Notes (Signed)
  HIGH-RISK PREGNANCY OFFICE VISIT Patient name: Brittney Cummings MRN [id number]  Date of birth: 03-Dec-1995 Chief Complaint:   Routine Prenatal Visit  History of Present Illness:   Brittney Cummings is a 25 y.o. O2D7412 female at [redacted]w[redacted]d with an Estimated Date of Delivery: 11/11/21 being seen today for ongoing management of a high-risk pregnancy complicated by [facility] currently on Labetalol 200 mg BID Today she reports  "feeling dizzy and just not right in the head" taking Labetalol 200 mg twice a day . She decreased the dose down to 100 mg twice a day and "feels so much better." Contractions: Not present. Vag. Bleeding: None.  Movement: Present. denies leaking of fluid.  Review of Systems:   Pertinent items are noted in HPI Denies abnormal vaginal discharge w/ itching/odor/irritation, headaches, visual changes, shortness of breath, chest pain, abdominal pain, severe nausea/vomiting, or problems with urination or bowel movements unless otherwise stated above. Pertinent History Reviewed:  Reviewed past medical,surgical, social, obstetrical and family history.  Reviewed problem list, medications and allergies. Physical Assessment:   Vitals:   05/19/21 1557  BP: 119/84  Pulse: 87  Weight: 173 lb (78.5 kg)  Body mass index is 33.79 kg/m.           Physical Examination:   General appearance: alert, well appearing, and in no distress, oriented to person, place, and time, and overweight  Mental status: alert, oriented to person, place, and time, normal mood, behavior, speech, dress, motor activity, and thought processes  Skin: warm & dry   Extremities: Edema: None    Cardiovascular: normal heart rate noted  Respiratory: normal respiratory effort, no distress  Abdomen: gravid, soft, non-tender  Pelvic: Cervical exam deferred         Fetal Status: Fetal Heart Rate (bpm): 154   Movement: Present    Fetal Surveillance Testing today: none   No results found for this or any previous  visit (from the past 24 hour(s)).  Assessment & Plan:  1) High-risk pregnancy G3P2002 at [redacted]w[redacted]d with an Estimated Date of Delivery: 11/11/21   2) Supervision of high risk pregnancy, antepartum  3) Chronic hypertension affecting pregnancy - Ok to keep taking Labetalol 100 mg BID since BP WNL  - Continue to monitor BP and enter in [facility]  4) Obesity affecting pregnancy in first trimester - Not taking bASA yet; forgot to start taking - Advised of the importance of taking the bASA for prevention of PEC  5) [redacted] weeks gestation of pregnancy   Meds: No orders of the defined types were placed in this encounter.   Labs/procedures today: none  Treatment Plan:  none  Reviewed: Preterm labor symptoms and general obstetric precautions including but not limited to vaginal bleeding, contractions, leaking of fluid and fetal movement were reviewed in detail with the patient.  All questions were answered. Has home bp cuff. Check bp weekly, let us know if >140/90.   Follow-up: Return in about 4 weeks (around 06/16/2021) for Return OB visit.  No orders of the defined types were placed in this encounter.  Raelyn Mora MSN, CNM 05/19/2021 4:16 PM

## 2021-05-30 ENCOUNTER — Telehealth: Payer: Self-pay

## 2021-05-30 NOTE — Telephone Encounter (Signed)
Left a message on voicemail to call and get scheduled with karen

## 2021-06-15 ENCOUNTER — Telehealth: Payer: Self-pay

## 2021-06-16 ENCOUNTER — Other Ambulatory Visit: Payer: Self-pay

## 2021-06-16 ENCOUNTER — Other Ambulatory Visit (HOSPITAL_COMMUNITY)
Admission: RE | Admit: 2021-06-16 | Discharge: 2021-06-16 | Disposition: A | Discharge status: Home / Self Care | Payer: No Typology Code available for payment source | Source: Ambulatory Visit | Attending: Obstetrics and Gynecology | Admitting: Obstetrics and Gynecology

## 2021-06-16 ENCOUNTER — Telehealth (INDEPENDENT_AMBULATORY_CARE_PROVIDER_SITE_OTHER): Payer: No Typology Code available for payment source | Admitting: Obstetrics and Gynecology

## 2021-06-16 VITALS — BP 128/83 | HR 82 | Temp 98.2°F | Wt 170.0 lb

## 2021-06-16 DIAGNOSIS — O26892 Other specified pregnancy related conditions, second trimester: Secondary | ICD-10-CM

## 2021-06-16 DIAGNOSIS — O0990 Supervision of high risk pregnancy, unspecified, unspecified trimester: Secondary | ICD-10-CM

## 2021-06-16 DIAGNOSIS — Z3A18 18 weeks gestation of pregnancy: Secondary | ICD-10-CM | POA: Diagnosis present

## 2021-06-16 DIAGNOSIS — O26899 Other specified pregnancy related conditions, unspecified trimester: Secondary | ICD-10-CM

## 2021-06-16 DIAGNOSIS — N898 Other specified noninflammatory disorders of vagina: Secondary | ICD-10-CM | POA: Diagnosis present

## 2021-06-16 NOTE — Progress Notes (Signed)
MY CHART VIDEO VIRTUAL OBSTETRICS VISIT ENCOUNTER NOTE  I connected with Brittney Cummings on 06/16/21 at  3:55 PM EDT by My Chart video at work and verified that I am speaking with the correct person using two identifiers. Provider located at [employer] for [employer] at [place].   I discussed the limitations, risks, security and privacy concerns of performing an evaluation and management service by My Chart video and the availability of in person appointments. I also discussed with the patient that there may be a patient responsible charge related to this service. The patient expressed understanding and agreed to proceed.  I discussed the limitations of telemedicine and the availability of in person appointments.  Discussed there is a possibility of technology failure and discussed alternative modes of communication if that failure occurs.  Subjective:  Brittney Cummings is a 25 y.o. G3P2002 at [redacted]w[redacted]d being followed for ongoing prenatal care.  She is currently monitored for the following issues for this high-risk pregnancy and has Chronic headaches; Sore throat; Pelvic pain; COVID-19; Back pain; Elevated blood pressure reading; Lumbar radiculopathy, right; Shortness of breath; General counselling and advice on contraception; Healthcare maintenance; Supervision of high risk pregnancy, antepartum; Chronic hypertension affecting pregnancy; and Obesity affecting pregnancy in first trimester on their problem list.  Patient reports headache and brownish vaginal discharge . Reports fetal movement. Denies any contractions, bleeding or leaking of fluid.   The following portions of the patient's history were reviewed and updated as appropriate: allergies, current medications, past family history, past medical history, past social history, past surgical history and problem list.   Objective:   General:  Alert, oriented and cooperative.   Mental Status: Normal mood and affect  perceived. Normal judgment and thought content.  Rest of physical exam deferred due to type of encounter  BP 128/83   Pulse 82   Temp 98.2 F (36.8 C)   Wt 170 lb (77.1 kg)   LMP 02/04/2021 (Exact Date)   BMI 33.20 kg/m  **Done by patient's own at home BP cuff and scale  Assessment and Plan:  Pregnancy: G3P2002 at [redacted]w[redacted]d  1. Vaginal discharge during pregnancy, antepartum - Cervicovaginal ancillary only( [place])  2. Supervision of high risk pregnancy, antepartum - Cervicovaginal ancillary only( Brittney Cummings) - AFP, Serum, Open Spina Bifida - Has appt with Brittney Cummings for H/A  3. [redacted] weeks gestation of pregnancy - Cervicovaginal ancillary only( Brittney Cummings) - AFP, Serum, Open Spina Bifida  Preterm labor symptoms and general obstetric precautions including but not limited to vaginal bleeding, contractions, leaking of fluid and fetal movement were reviewed in detail with the patient.  I discussed the assessment and treatment plan with the patient. The patient was provided an opportunity to ask questions and all were answered. The patient agreed with the plan and demonstrated an understanding of the instructions. The patient was advised to call back or seek an in-person office evaluation/go to [facility] at [facility] for any urgent or concerning symptoms. Please refer to After Visit Summary for other counseling recommendations.   I provided 10 minutes of non-face-to-face time during this encounter. There was 5 minutes of chart review time spent prior to this encounter. Total time spent = 15 minutes.  Return in about 4 weeks (around 07/14/2021) for Return OB visit.  Future Appointments  Date Time Provider Department Center  07/20/2021  4:15 PM Armando Reichert, CNM [facility] None  07/22/2021  3:30 PM [facility] NURSE [facility]  07/22/2021  3:45 PM [facility] US6 [facility]  08/18/2021  8:35 AM Raelyn Mora, CNM [facility] None    Raelyn Mora, CNM Center for [employer], [facility]

## 2021-06-17 ENCOUNTER — Other Ambulatory Visit: Payer: Self-pay | Admitting: *Deleted

## 2021-06-17 ENCOUNTER — Encounter: Payer: Self-pay | Admitting: *Deleted

## 2021-06-17 ENCOUNTER — Encounter: Payer: Self-pay | Admitting: Physician Assistant

## 2021-06-17 ENCOUNTER — Ambulatory Visit: Payer: No Typology Code available for payment source | Admitting: Physician Assistant

## 2021-06-17 ENCOUNTER — Ambulatory Visit: Payer: No Typology Code available for payment source | Admitting: *Deleted

## 2021-06-17 ENCOUNTER — Other Ambulatory Visit: Payer: Self-pay

## 2021-06-17 ENCOUNTER — Other Ambulatory Visit (HOSPITAL_COMMUNITY): Payer: Self-pay

## 2021-06-17 ENCOUNTER — Ambulatory Visit: Payer: No Typology Code available for payment source | Attending: Obstetrics and Gynecology

## 2021-06-17 ENCOUNTER — Ambulatory Visit
Payer: No Typology Code available for payment source | Attending: Maternal & Fetal Medicine | Admitting: Maternal & Fetal Medicine

## 2021-06-17 VITALS — BP 127/90 | HR 80

## 2021-06-17 VITALS — BP 128/81 | HR 83

## 2021-06-17 DIAGNOSIS — G43009 Migraine without aura, not intractable, without status migrainosus: Secondary | ICD-10-CM | POA: Diagnosis not present

## 2021-06-17 DIAGNOSIS — Z3A19 19 weeks gestation of pregnancy: Secondary | ICD-10-CM | POA: Diagnosis not present

## 2021-06-17 DIAGNOSIS — O26892 Other specified pregnancy related conditions, second trimester: Secondary | ICD-10-CM

## 2021-06-17 DIAGNOSIS — O10012 Pre-existing essential hypertension complicating pregnancy, second trimester: Secondary | ICD-10-CM | POA: Diagnosis not present

## 2021-06-17 DIAGNOSIS — R519 Headache, unspecified: Secondary | ICD-10-CM | POA: Diagnosis not present

## 2021-06-17 DIAGNOSIS — O99211 Obesity complicating pregnancy, first trimester: Secondary | ICD-10-CM | POA: Diagnosis present

## 2021-06-17 DIAGNOSIS — M62838 Other muscle spasm: Secondary | ICD-10-CM | POA: Diagnosis not present

## 2021-06-17 DIAGNOSIS — O10919 Unspecified pre-existing hypertension complicating pregnancy, unspecified trimester: Secondary | ICD-10-CM

## 2021-06-17 DIAGNOSIS — Z362 Encounter for other antenatal screening follow-up: Secondary | ICD-10-CM

## 2021-06-17 DIAGNOSIS — O0990 Supervision of high risk pregnancy, unspecified, unspecified trimester: Secondary | ICD-10-CM

## 2021-06-17 DIAGNOSIS — O10912 Unspecified pre-existing hypertension complicating pregnancy, second trimester: Secondary | ICD-10-CM

## 2021-06-17 LAB — CERVICOVAGINAL ANCILLARY ONLY
Bacterial Vaginitis (gardnerella): NEGATIVE
Candida Glabrata: NEGATIVE
Candida Vaginitis: NEGATIVE
Chlamydia: NEGATIVE
Comment: NEGATIVE
Comment: NEGATIVE
Comment: NEGATIVE
Comment: NEGATIVE
Comment: NEGATIVE
Comment: NORMAL
Neisseria Gonorrhea: NEGATIVE
Trichomonas: NEGATIVE

## 2021-06-17 MED ORDER — BUTALBITAL-APAP-CAFFEINE 50-325-40 MG PO TABS
1.0000 | ORAL_TABLET | Freq: Four times a day (QID) | ORAL | 2 refills | Status: DC | PRN
  Filled 2021-06-17 – 2021-08-24 (×2): qty 30, 4d supply, fill #0

## 2021-06-17 MED ORDER — BUTALBITAL-APAP-CAFFEINE 50-325-40 MG PO CAPS: 1.0000 | ORAL_CAPSULE | Freq: Four times a day (QID) | ORAL | 3 refills | Status: DC | PRN

## 2021-06-17 MED ORDER — BUTALBITAL-APAP-CAFFEINE 50-325-40 MG PO CAPS
1.0000 | ORAL_CAPSULE | Freq: Four times a day (QID) | ORAL | 3 refills | Status: DC | PRN
  Filled 2021-06-17: qty 30, 4d supply, fill #0

## 2021-06-17 MED ORDER — CYCLOBENZAPRINE HCL 10 MG PO TABS
10.0000 mg | ORAL_TABLET | Freq: Three times a day (TID) | ORAL | 3 refills | Status: DC | PRN
  Filled 2021-06-17 – 2021-08-24 (×2): qty 40, 14d supply, fill #0

## 2021-06-17 NOTE — Progress Notes (Signed)
Patient is a new referral to discuss headaches.  History:  Brittney Cummings is a 26 y.o. E1D4081 who presents to clinic today for headaches.  She is [redacted] weeks pregnant.  She gets them 3-4 times per week and they may go on up to 3 days.  She has eye pain on one side prior to the headache.  It feels like a muscle pulling.  Then the headache starts in that area and goes all over the front of head.  They get to be severe, throbbing, worse with movement, lights, noises.  She sees stars flickering during the headache.  It is painful to look up.  There is nausea and can't keep drink down.  Sometimes they occur first thing in the morning upon awakening.  Headaches first started about one year ago and were occurring 1-2 times a week before but since July they are more frequent, lasting longer.  She also has tension headaches.   She has used Tylenol, rest.  She was given an rx for rizatriptan but learned of the pregnancy and did not use. The pregnancy seems to have made the headaches increase.  She has not had this issue with past pregnancies.    She has flexeril on her med list from an injury some time ago.  She has not used that medication for headache.   Number of days in the last 4 weeks with:  Severe headache: 12 Moderate headache: 6 Mild headache: 2  No headache: 8   Past Medical History:  Diagnosis Date   Hypertension     Social History   Socioeconomic History   Marital status: Married    Spouse name: Not on file   Number of children: 3   Years of education: Not on file   Highest education level: Not on file  Occupational History   Not on file  Tobacco Use   Smoking status: Never    Passive exposure: Never   Smokeless tobacco: Never  Vaping Use   Vaping Use: Never used  Substance and Sexual Activity   Alcohol use: No   Drug use: No   Sexual activity: Yes    Birth control/protection: Condom, None    Comment: preg  Other Topics Concern   Not on file  Social History  Narrative   Not on file   Social Determinants of Health   Financial Resource Strain: Not on file  Food Insecurity: Not on file  Transportation Needs: Not on file  Physical Activity: Not on file  Stress: Not on file  Social Connections: Not on file  Intimate Partner Violence: Not on file    Family History  Problem Relation Age of Onset   Seizures Mother    High Cholesterol Father    Hypertension Father    Diabetes Father     Allergies  Allergen Reactions   Mushroom Extract Complex Swelling    Angioedema (tongue and lips, trouble breathing, treated w/epi pen)    Current Outpatient Medications on File Prior to Visit  Medication Sig Dispense Refill   cyclobenzaprine (FLEXERIL) 10 MG tablet Take 1 tablet (10 mg total) by mouth 2 (two) times daily as needed for muscle spasms. 20 tablet 0   labetalol (NORMODYNE) 100 MG tablet Take 1 tablet (100 mg total) by mouth 2 (two) times daily. 180 tablet 3   labetalol (NORMODYNE) 200 MG tablet Take 1 tablet (200 mg total) by mouth 2 (two) times daily. 60 tablet 6   Prenatal Vit-Fe Fumarate-FA (MULTIVITAMIN-PRENATAL) 27-0.8 MG  TABS tablet Take 1 tablet by mouth daily at 12 noon.     No current facility-administered medications on file prior to visit.     Review of Systems:  All pertinent positive/negative included in HPI, all other review of systems are negative   Objective:  Physical Exam BP 127/90   Pulse 80   LMP 02/04/2021 (Exact Date)   LMP 02/04/2021 (Exact Date)  CONSTITUTIONAL: Well-developed, well-nourished female in no acute distress.  EYES: EOM intact ENT: Normocephalic CARDIOVASCULAR: Regular rate  RESPIRATORY: Normal rate.  MUSCULOSKELETAL: Normal ROM SKIN: Warm, dry without erythema  NEUROLOGICAL: Alert, oriented, CN II-XII grossly intact, Appropriate balance  PSYCH: Normal behavior, mood   Assessment & Plan:  Assessment: Migraine without aura and without status migrainosus, not intractable  Pregnancy  headache in second trimester  Muscle spasm    Plan: Liberally use ice/biofreeze/heat/stretching/stress relief strategies to help headache and muscle spasm Flexeril prn headache/muscle spasm - sedation precautions Fioricet prn headache when flexeril is not sufficient or she needs to stay alert.  Do not use concurrent tylenol.Limit to 2 days per week.  Follow-up PRN  Charlyne Petrin 06/17/2021 9:03 AM'

## 2021-06-17 NOTE — Patient Instructions (Signed)
Migraine Headache A migraine headache is a very strong throbbing pain on one side or both sides of your head. This type of headache can also cause other symptoms. It can last from 4 hours to 3 days. Talk with your doctor about what things may bring on (trigger) this condition. What are the causes? The exact cause of this condition is not known. This condition may be triggered or caused by: Drinking alcohol. Smoking. Taking medicines, such as: Medicine used to treat chest pain (nitroglycerin). Birth control pills. Estrogen. Some blood pressure medicines. Eating or drinking certain products. Doing physical activity. Other things that may trigger a migraine headache include: Having a menstrual period. Pregnancy. Hunger. Stress. Not getting enough sleep or getting too much sleep. Weather changes. Tiredness (fatigue). What increases the risk? Being 25-55 years old. Being female. Having a family history of migraine headaches. Being Caucasian. Having depression or anxiety. Being very overweight. What are the signs or symptoms? A throbbing pain. This pain may: Happen in any area of the head, such as on one side or both sides. Make it hard to do daily activities. Get worse with physical activity. Get worse around bright lights or loud noises. Other symptoms may include: Feeling sick to your stomach (nauseous). Vomiting. Dizziness. Being sensitive to bright lights, loud noises, or smells. Before you get a migraine headache, you may get warning signs (an aura). An aura may include: Seeing flashing lights or having blind spots. Seeing bright spots, halos, or zigzag lines. Having tunnel vision or blurred vision. Having numbness or a tingling feeling. Having trouble talking. Having weak muscles. Some people have symptoms after a migraine headache (postdromal phase), such as: Tiredness. Trouble thinking (concentrating). How is this treated? Taking medicines that: Relieve  pain. Relieve the feeling of being sick to your stomach. Prevent migraine headaches. Treatment may also include: Having acupuncture. Avoiding foods that bring on migraine headaches. Learning ways to control your body functions (biofeedback). Therapy to help you know and deal with negative thoughts (cognitive behavioral therapy). Follow these instructions at home: Medicines Take over-the-counter and prescription medicines only as told by your doctor. Ask your doctor if the medicine prescribed to you: Requires you to avoid driving or using heavy machinery. Can cause trouble pooping (constipation). You may need to take these steps to prevent or treat trouble pooping: Drink enough fluid to keep your pee (urine) pale yellow. Take over-the-counter or prescription medicines. Eat foods that are high in fiber. These include beans, whole grains, and fresh fruits and vegetables. Limit foods that are high in fat and sugar. These include fried or sweet foods. Lifestyle Do not drink alcohol. Do not use any products that contain nicotine or tobacco, such as cigarettes, e-cigarettes, and chewing tobacco. If you need help quitting, ask your doctor. Get at least 8 hours of sleep every night. Limit and deal with stress. General instructions   Keep a journal to find out what may bring on your migraine headaches. For example, write down: What you eat and drink. How much sleep you get. Any change in what you eat or drink. Any change in your medicines. If you have a migraine headache: Avoid things that make your symptoms worse, such as bright lights. It may help to lie down in a dark, quiet room. Do not drive or use heavy machinery. Ask your doctor what activities are safe for you. Keep all follow-up visits as told by your doctor. This is important. Contact a doctor if: You get a migraine headache that   is different or worse than others you have had. You have more than 15 headache days in one  month. Get help right away if: Your migraine headache gets very bad. Your migraine headache lasts longer than 72 hours. You have a fever. You have a stiff neck. You have trouble seeing. Your muscles feel weak or like you cannot control them. You start to lose your balance a lot. You start to have trouble walking. You pass out (faint). You have a seizure. Summary A migraine headache is a very strong throbbing pain on one side or both sides of your head. These headaches can also cause other symptoms. This condition may be treated with medicines and changes to your lifestyle. Keep a journal to find out what may bring on your migraine headaches. Contact a doctor if you get a migraine headache that is different or worse than others you have had. Contact your doctor if you have more than 15 headache days in a month. This information is not intended to replace advice given to you by your health care provider. Make sure you discuss any questions you have with your health care provider. Document Revised: 11/29/2018 Document Reviewed: 09/19/2018 Elsevier Patient Education  2022 Elsevier Inc.  

## 2021-06-17 NOTE — Progress Notes (Signed)
MFM Consultation  Ms. Lesleigh Noe is a 25 yo G3P2 who is here at 19w 0 d here with a single intrauterine pregnancy for a detailed anatomy due to chronic hypertension.  Her pregnancy is uncomplicated with exception of her chronic hypertension requiring labetalol 100 mg BID for control.   She takes low dose ASA daily. She had a low risk NIPS and neg Horizon.  Normal anatomy with measurements consistent with dates There is good fetal movement and amniotic fluid volume Suboptimal views of the fetal anatomy were obtained secondary to fetal position.  We discussed the increased risk for preeclampsia, placental abruption and fetal growth restriction in women with chronic hypertension. She is taking low dose ASA for preeclampsia prevention. I explained that serial growth exams should be performed every 4 weeks throughout the pregnancy. Medical therapy should increased if blood pressure persist >150/100's.   Given that Ms. Genia Hotter is being management with Labetalol we recommend weekly testing at 32 weeks. She is aware of the s/sx or preeclampsia including headache, vision changes and right upper quadrant pain.  Baseline labs of UPC, CBC, and CMP should be performed if not performed previously.   Recommendation:  Follow up growth in 4-6 weeks.   I spent 30 minutes with > 50% in face to face consultation.  Novella Olive, MD.

## 2021-06-17 NOTE — Progress Notes (Signed)
   Baby scripts called the office and stated "that patient had a severe headache that will not go away with a elevated BP of 127/90".  Pt has two appointments for headaches today.  One being the headache clinic. Pt to f/u up with [facility] 07/20/2021 unless needed sooner.   Gearldine Shown, CMA

## 2021-06-18 LAB — AFP, SERUM, OPEN SPINA BIFIDA
AFP MoM: 0.87
AFP Value: 36.7 ng/mL
Gest. Age on Collection Date: 18 weeks
Maternal Age At EDD: 26.9 yr
OSBR Risk 1 IN: 10000
Test Results:: NEGATIVE
Weight: 170 [lb_av]

## 2021-06-20 ENCOUNTER — Encounter: Payer: Self-pay | Admitting: Obstetrics and Gynecology

## 2021-06-27 ENCOUNTER — Other Ambulatory Visit (HOSPITAL_COMMUNITY): Payer: Self-pay

## 2021-07-12 ENCOUNTER — Encounter: Payer: Self-pay | Admitting: Obstetrics and Gynecology

## 2021-07-20 ENCOUNTER — Ambulatory Visit (INDEPENDENT_AMBULATORY_CARE_PROVIDER_SITE_OTHER): Payer: No Typology Code available for payment source | Admitting: Advanced Practice Midwife

## 2021-07-20 ENCOUNTER — Other Ambulatory Visit: Payer: Self-pay

## 2021-07-20 ENCOUNTER — Encounter: Payer: Self-pay | Admitting: Advanced Practice Midwife

## 2021-07-20 ENCOUNTER — Other Ambulatory Visit (HOSPITAL_COMMUNITY)
Admission: RE | Admit: 2021-07-20 | Discharge: 2021-07-20 | Disposition: A | Discharge status: Home / Self Care | Payer: No Typology Code available for payment source | Source: Ambulatory Visit | Attending: Advanced Practice Midwife | Admitting: Advanced Practice Midwife

## 2021-07-20 VITALS — BP 115/82 | HR 75 | Temp 97.8°F | Wt 173.4 lb

## 2021-07-20 DIAGNOSIS — N898 Other specified noninflammatory disorders of vagina: Secondary | ICD-10-CM

## 2021-07-20 DIAGNOSIS — Z3A23 23 weeks gestation of pregnancy: Secondary | ICD-10-CM | POA: Insufficient documentation

## 2021-07-20 DIAGNOSIS — O26899 Other specified pregnancy related conditions, unspecified trimester: Secondary | ICD-10-CM | POA: Insufficient documentation

## 2021-07-20 DIAGNOSIS — O0990 Supervision of high risk pregnancy, unspecified, unspecified trimester: Secondary | ICD-10-CM

## 2021-07-20 NOTE — Progress Notes (Signed)
   PRENATAL VISIT NOTE  Subjective:  Brittney Cummings is a 25 y.o. G3P2002 at [redacted]w[redacted]d being seen today for ongoing prenatal care.  She is currently monitored for the following issues for this high-risk pregnancy and has Chronic headaches; Sore throat; Pelvic pain; COVID-19; Back pain; Elevated blood pressure reading; Lumbar radiculopathy, right; Shortness of breath; General counselling and advice on contraception; Healthcare maintenance; Supervision of high risk pregnancy, antepartum; Chronic hypertension affecting pregnancy; and Obesity affecting pregnancy in first trimester on their problem list.  Patient reports  having some brown spotting/discharge .  Contractions: Not present. Vag. Bleeding: None.  Movement: Present. Denies leaking of fluid.   The following portions of the patient's history were reviewed and updated as appropriate: allergies, current medications, past family history, past medical history, past social history, past surgical history and problem list.   Objective:   Vitals:   07/20/21 0854  BP: 115/82  Pulse: 75  Temp: 97.8 F (36.6 C)  Weight: 78.7 kg    Fetal Status: Fetal Heart Rate (bpm): 159 Fundal Height: 24 cm Movement: Present     General:  Alert, oriented and cooperative. Patient is in no acute distress.  Skin: Skin is warm and dry. No rash noted.   Cardiovascular: Normal heart rate noted  Respiratory: Normal respiratory effort, no problems with respiration noted  Abdomen: Soft, gravid, appropriate for gestational age.  Pain/Pressure: Absent     Pelvic: Cervical exam performed in the presence of a chaperone Dilation: Closed Effacement (%): Thick Station: Ballotable  Extremities: Normal range of motion.  Edema: None  Mental Status: Normal mood and affect. Normal behavior. Normal judgment and thought content.   Assessment and Plan:  Pregnancy: G3P2002 at [redacted]w[redacted]d 1. Supervision of high risk pregnancy, antepartum   2. [redacted] weeks gestation of  pregnancy  3. Vaginal discharge - Wet prep - Urine Culture    Preterm labor symptoms and general obstetric precautions including but not limited to vaginal bleeding, contractions, leaking of fluid and fetal movement were reviewed in detail with the patient. Please refer to After Visit Summary for other counseling recommendations.   Return in about 2 weeks (around 08/03/2021) for Will need 2 hour GTT and 28 week labs at next visit .  Future Appointments  Date Time Provider Department Center  07/22/2021  3:30 PM [facility] NURSE [facility]  07/22/2021  3:45 PM [facility] US6 [facility]  08/18/2021  8:35 AM Raelyn Mora, CNM [facility] None   Thressa Sheller DNP, CNM  07/20/21  4:40 PM

## 2021-07-22 ENCOUNTER — Other Ambulatory Visit: Payer: Self-pay

## 2021-07-22 ENCOUNTER — Ambulatory Visit: Payer: No Typology Code available for payment source | Attending: Obstetrics and Gynecology

## 2021-07-22 ENCOUNTER — Ambulatory Visit: Payer: No Typology Code available for payment source | Admitting: *Deleted

## 2021-07-22 VITALS — BP 134/83

## 2021-07-22 DIAGNOSIS — O99212 Obesity complicating pregnancy, second trimester: Secondary | ICD-10-CM | POA: Diagnosis not present

## 2021-07-22 DIAGNOSIS — E669 Obesity, unspecified: Secondary | ICD-10-CM | POA: Diagnosis not present

## 2021-07-22 DIAGNOSIS — O10012 Pre-existing essential hypertension complicating pregnancy, second trimester: Secondary | ICD-10-CM | POA: Diagnosis not present

## 2021-07-22 DIAGNOSIS — Z362 Encounter for other antenatal screening follow-up: Secondary | ICD-10-CM | POA: Diagnosis not present

## 2021-07-22 DIAGNOSIS — O10912 Unspecified pre-existing hypertension complicating pregnancy, second trimester: Secondary | ICD-10-CM | POA: Diagnosis present

## 2021-07-22 DIAGNOSIS — Z3A24 24 weeks gestation of pregnancy: Secondary | ICD-10-CM

## 2021-07-22 LAB — CERVICOVAGINAL ANCILLARY ONLY
Bacterial Vaginitis (gardnerella): NEGATIVE
Candida Glabrata: NEGATIVE
Candida Vaginitis: NEGATIVE
Comment: NEGATIVE
Comment: NEGATIVE
Comment: NEGATIVE

## 2021-07-22 NOTE — Progress Notes (Signed)
Pt states she has had brown/ black discharge when she wipes for the past month. She was checked for vag infections and had cervical check and she was closed.

## 2021-07-23 LAB — CULTURE, OB URINE

## 2021-07-23 LAB — URINE CULTURE, OB REFLEX

## 2021-07-25 ENCOUNTER — Other Ambulatory Visit: Payer: Self-pay | Admitting: *Deleted

## 2021-07-25 DIAGNOSIS — Z6838 Body mass index (BMI) 38.0-38.9, adult: Secondary | ICD-10-CM

## 2021-07-25 DIAGNOSIS — O10912 Unspecified pre-existing hypertension complicating pregnancy, second trimester: Secondary | ICD-10-CM

## 2021-08-16 ENCOUNTER — Ambulatory Visit (HOSPITAL_COMMUNITY)
Admission: EM | Admit: 2021-08-16 | Discharge: 2021-08-16 | Disposition: A | Discharge status: Home / Self Care | Payer: No Typology Code available for payment source | Attending: Student | Admitting: Student

## 2021-08-16 ENCOUNTER — Encounter (HOSPITAL_COMMUNITY): Payer: Self-pay | Admitting: Emergency Medicine

## 2021-08-16 DIAGNOSIS — J111 Influenza due to unidentified influenza virus with other respiratory manifestations: Secondary | ICD-10-CM | POA: Diagnosis not present

## 2021-08-16 DIAGNOSIS — O98512 Other viral diseases complicating pregnancy, second trimester: Secondary | ICD-10-CM

## 2021-08-16 DIAGNOSIS — Z3A27 27 weeks gestation of pregnancy: Secondary | ICD-10-CM | POA: Diagnosis not present

## 2021-08-16 DIAGNOSIS — Z20828 Contact with and (suspected) exposure to other viral communicable diseases: Secondary | ICD-10-CM

## 2021-08-16 DIAGNOSIS — O99512 Diseases of the respiratory system complicating pregnancy, second trimester: Secondary | ICD-10-CM

## 2021-08-16 LAB — POC INFLUENZA A AND B ANTIGEN (URGENT CARE ONLY)
INFLUENZA A ANTIGEN, POC: NEGATIVE
INFLUENZA B ANTIGEN, POC: NEGATIVE

## 2021-08-16 MED ORDER — PREDNISONE 20 MG PO TABS
20.0000 mg | ORAL_TABLET | Freq: Every day | ORAL | 0 refills | Status: DC
  Filled 2021-08-16: qty 5, 5d supply, fill #0

## 2021-08-16 MED ORDER — ALBUTEROL SULFATE HFA 108 (90 BASE) MCG/ACT IN AERS
1.0000 | INHALATION_SPRAY | Freq: Four times a day (QID) | RESPIRATORY_TRACT | 0 refills | Status: DC | PRN
  Filled 2021-08-16: qty 18, 25d supply, fill #0

## 2021-08-16 NOTE — Discharge Instructions (Addendum)
-  Prednisone one pill daily x5 days -Albuterol inhaler as needed for cough, wheezing, shortness of breath, 1 to 2 puffs every 6 hours as needed. -Tylenol up to 1000mg  3x daily  -You can try over-the-counter mediations like mucinex - check the sheet of medications safe in pregnancy that I provided  -With a virus, you're typically contagious for 5-7 days, or as long as you're having fevers.  -Follow-up if symptoms getting worse instead of better- shortness of breath, chest pain, dizziness, fevers

## 2021-08-16 NOTE — ED Provider Notes (Signed)
[facility]    CSN: [id number] Arrival date & time: 08/16/21  1728      History   Chief Complaint Chief Complaint  Patient presents with   Cough    HPI Brittney Cummings is a 25 y.o. female presenting with viral syndrome.  Medical history noncontributory, denies history of pulmonary disease. Cough is nonproductive, though states it feels that congestion gets stuck in chest. Some eye irritation without vision changes, discharge, foreign body sensation, etc. Myalgias and throbbing headaches. Has attempted cough drops, Vick's lozenges with temporary relief of cough. Denies fevers/chills. Denies n/v/d/c. Denies SOB, CP, dizziness, weakness.   HPI  Past Medical History:  Diagnosis Date   Hypertension     Patient Active Problem List   Diagnosis Date Noted   Obesity affecting pregnancy in first trimester 04/21/2021   Chronic hypertension affecting pregnancy 04/13/2021   Supervision of high risk pregnancy, antepartum 03/24/2021   General counselling and advice on contraception 01/25/2021   Healthcare maintenance 01/25/2021   Shortness of breath 12/30/2020   Lumbar radiculopathy, right 11/26/2020   Back pain 10/12/2020   Elevated blood pressure reading 10/12/2020   COVID-19 09/01/2020   Pelvic pain 07/08/2020   Sore throat 01/29/2020   Chronic headaches 07/25/2019    Past Surgical History:  Procedure Laterality Date   NO PAST SURGERIES      OB History     Gravida  3   Para  2   Term  2   Preterm      AB      Living  2      SAB      IAB      Ectopic      Multiple      Live Births  2            Home Medications    Prior to Admission medications   Medication Sig Start Date End Date Taking? Authorizing Provider  albuterol (VENTOLIN HFA) 108 (90 Base) MCG/ACT inhaler Inhale 1-2 puffs into the lungs every 6 (six) hours as needed for wheezing or shortness of breath. 08/16/21  Yes Rhys Martini, PA-C  predniSONE (DELTASONE) 20 MG  tablet Take 1 tablet (20 mg total) by mouth daily for 5 days. 08/16/21 08/21/21 Yes Rhys Martini, PA-C  aspirin EC 81 MG tablet Take 81 mg by mouth daily. Swallow whole.    [provider]  butalbital-acetaminophen-caffeine (FIORICET) 50-325-40 MG tablet Take 1-2 tablets by mouth every 6 (six) hours as needed for headache. 06/17/21 06/17/22  Glyn Ade, Scot Jun, PA-C  cyclobenzaprine (FLEXERIL) 10 MG tablet Take 1 tablet (10 mg total) by mouth 3 (three) times daily as needed for muscle spasms (headache). 06/17/21   Glyn Ade, Scot Jun, PA-C  labetalol (NORMODYNE) 100 MG tablet Take 1 tablet (100 mg total) by mouth 2 (two) times daily. 03/24/21   Derrel Nip, MD  Prenatal Vit-Fe Fumarate-FA (MULTIVITAMIN-PRENATAL) 27-0.8 MG TABS tablet Take 1 tablet by mouth daily at 12 noon.    [provider]    Family History Family History  Problem Relation Age of Onset   Seizures Mother    High Cholesterol Father    Hypertension Father    Diabetes Father     Social History Social History   Tobacco Use   Smoking status: Never    Passive exposure: Never   Smokeless tobacco: Never  Vaping Use   Vaping Use: Never used  Substance Use Topics   Alcohol use:  No   Drug use: No     Allergies   Mushroom extract complex   Review of Systems Review of Systems  Constitutional:  Negative for appetite change, chills and fever.  HENT:  Positive for congestion. Negative for ear pain, rhinorrhea, sinus pressure, sinus pain and sore throat.   Eyes:  Negative for redness and visual disturbance.  Respiratory:  Positive for cough. Negative for chest tightness, shortness of breath and wheezing.   Cardiovascular:  Negative for chest pain and palpitations.  Gastrointestinal:  Negative for abdominal pain, constipation, diarrhea, nausea and vomiting.  Genitourinary:  Negative for dysuria, frequency and urgency.  Musculoskeletal:  Negative for myalgias.  Neurological:  Negative for  dizziness, weakness and headaches.  Psychiatric/Behavioral:  Negative for confusion.   All other systems reviewed and are negative.   Physical Exam Triage Vital Signs ED Triage Vitals  Enc Vitals Group     BP      Pulse      Resp      Temp      Temp src      SpO2      Weight      Height      Head Circumference      Peak Flow      Pain Score      Pain Loc      Pain Edu?      Excl. in [place]?    No data found.  Updated Vital Signs BP 123/89 (BP Location: Right Arm)    Pulse 94    Temp 98.4 F (36.9 C) (Oral)    Resp 18    LMP 02/04/2021 (Exact Date)    SpO2 98%   Visual Acuity Right Eye Distance:   Left Eye Distance:   Bilateral Distance:    Right Eye Near:   Left Eye Near:    Bilateral Near:     Physical Exam Vitals reviewed.  Constitutional:      General: She is not in acute distress.    Appearance: Normal appearance. She is not ill-appearing.  HENT:     Head: Normocephalic and atraumatic.     Right Ear: Tympanic membrane, ear canal and external ear normal. No tenderness. No middle ear effusion. There is no impacted cerumen. Tympanic membrane is not perforated, erythematous, retracted or bulging.     Left Ear: Tympanic membrane, ear canal and external ear normal. No tenderness.  No middle ear effusion. There is no impacted cerumen. Tympanic membrane is not perforated, erythematous, retracted or bulging.     Nose: Nose normal. No congestion.     Mouth/Throat:     Mouth: Mucous membranes are moist.     Pharynx: Uvula midline. No oropharyngeal exudate or posterior oropharyngeal erythema.  Eyes:     Extraocular Movements: Extraocular movements intact.     Pupils: Pupils are equal, round, and reactive to light.  Cardiovascular:     Rate and Rhythm: Normal rate and regular rhythm.     Heart sounds: Normal heart sounds.  Pulmonary:     Effort: Pulmonary effort is normal.     Breath sounds: Normal breath sounds. No decreased breath sounds, wheezing, rhonchi or rales.      Comments: Freq cough  Abdominal:     Palpations: Abdomen is soft.     Tenderness: There is no abdominal tenderness. There is no guarding or rebound.     Comments: Pregnant   Lymphadenopathy:     Cervical: No cervical adenopathy.  Right cervical: No superficial cervical adenopathy.    Left cervical: No superficial cervical adenopathy.  Neurological:     General: No focal deficit present.     Mental Status: She is alert and oriented to person, place, and time.  Psychiatric:        Mood and Affect: Mood normal.        Behavior: Behavior normal.        Thought Content: Thought content normal.        Judgment: Judgment normal.     UC Treatments / Results  Labs (all labs ordered are listed, but only abnormal results are displayed) Labs Reviewed  POC INFLUENZA A AND B ANTIGEN (URGENT CARE ONLY)    EKG   Radiology No results found.  Procedures Procedures (including critical care time)  Medications Ordered in UC Medications - No data to display  Initial Impression / Assessment and Plan / UC Course  I have reviewed the triage vital signs and the nursing notes.  Pertinent labs & imaging results that were available during my care of the patient were reviewed by me and considered in my medical decision making (see chart for details).     This patient is a very pleasant 25 y.o. year old female presenting with exposure to influenza, now with viral syndrome x4 days. Today this pt is afebrile nontachycardic nontachypneic, oxygenating well on room air, no wheezes rhonchi or rales. Denies history pulm ds.  Currently pregnant- 27 weeks. EDD: 11/11/21.  Vaccinated for influenza 03/2021. Exposure to influenza. Rapid influenza negative today.  Symptoms x4 days, current pregnancy. Discussed risks and benefits of tamiflu. Patient prefers to defer this as she is out of tamiflu window and is currently pregnant. Continue OTC medications for symptomatic relief.  Low-dose prednisone and  albuterol inhaler sent. Also provided her with list of medications safe in pregnancy.  Work note provided. ED return precautions discussed. Patient verbalizes understanding and agreement.     Final Clinical Impressions(s) / UC Diagnoses   Final diagnoses:  Influenza-like illness  Exposure to influenza  [redacted] weeks gestation of pregnancy  Diseases of the resp sys comp pregnancy, second trimester     Discharge Instructions      -Prednisone one pill daily x5 days -Albuterol inhaler as needed for cough, wheezing, shortness of breath, 1 to 2 puffs every 6 hours as needed. -Tylenol up to 1000mg  3x daily  -You can try over-the-counter mediations like mucinex - check the sheet of medications safe in pregnancy that I provided  -With a virus, you're typically contagious for 5-7 days, or as long as you're having fevers.  -Follow-up if symptoms getting worse instead of better- shortness of breath, chest pain, dizziness, fevers     ED Prescriptions     Medication Sig Dispense Auth. Provider   predniSONE (DELTASONE) 20 MG tablet Take 1 tablet (20 mg total) by mouth daily for 5 days. 5 tablet Hazel Sams, PA-C   albuterol (VENTOLIN HFA) 108 (90 Base) MCG/ACT inhaler Inhale 1-2 puffs into the lungs every 6 (six) hours as needed for wheezing or shortness of breath. 1 each Hazel Sams, PA-C      PDMP not reviewed this encounter.   Hazel Sams, PA-C 08/16/21 1939

## 2021-08-16 NOTE — ED Triage Notes (Signed)
Kids recently had flu. Reports cough and congestion since Saturday.  Pt is pregnant approx 27 weeks on Friday

## 2021-08-17 ENCOUNTER — Other Ambulatory Visit (HOSPITAL_COMMUNITY): Payer: Self-pay

## 2021-08-18 ENCOUNTER — Ambulatory Visit (INDEPENDENT_AMBULATORY_CARE_PROVIDER_SITE_OTHER): Payer: No Typology Code available for payment source | Admitting: Obstetrics and Gynecology

## 2021-08-18 ENCOUNTER — Other Ambulatory Visit: Payer: Self-pay

## 2021-08-18 ENCOUNTER — Encounter: Payer: Self-pay | Admitting: Obstetrics and Gynecology

## 2021-08-18 VITALS — BP 121/84 | HR 81 | Temp 97.8°F | Wt 171.4 lb

## 2021-08-18 DIAGNOSIS — Z23 Encounter for immunization: Secondary | ICD-10-CM | POA: Diagnosis not present

## 2021-08-18 DIAGNOSIS — O10919 Unspecified pre-existing hypertension complicating pregnancy, unspecified trimester: Secondary | ICD-10-CM

## 2021-08-18 DIAGNOSIS — Z3A27 27 weeks gestation of pregnancy: Secondary | ICD-10-CM | POA: Diagnosis not present

## 2021-08-18 DIAGNOSIS — O99211 Obesity complicating pregnancy, first trimester: Secondary | ICD-10-CM

## 2021-08-18 DIAGNOSIS — O0990 Supervision of high risk pregnancy, unspecified, unspecified trimester: Secondary | ICD-10-CM

## 2021-08-18 NOTE — Patient Instructions (Signed)
Third Trimester of Pregnancy The third trimester of pregnancy is from week 28 through week 40. This is months 7 through 9. The third trimester is a time when the unborn baby (fetus) is growing rapidly. At the end of the ninth month, the fetus is about 20 inches long and weighs 6-10 pounds. Body changes during your third trimester During the third trimester, your body will continue to go through many changes. The changes vary and generally return to normal after your baby is born. Physical changes Your weight will continue to increase. You can expect to gain 25-35 pounds (11-16 kg) by the end of the pregnancy if you begin pregnancy at a normal weight. If you are underweight, you can expect to gain 28-40 lb (about 13-18 kg), and if you are overweight, you can expect to gain 15-25 lb (about 7-11 kg). You may begin to get stretch marks on your hips, abdomen, and breasts. Your breasts will continue to grow and may hurt. A yellow fluid (colostrum) may leak from your breasts. This is the first milk you are producing for your baby. You may have changes in your hair. These can include thickening of your hair, rapid growth, and changes in texture. Some people also have hair loss during or after pregnancy, or hair that feels dry or thin. Your belly button may stick out. You may notice more swelling in your hands, face, or ankles. Health changes You may have heartburn. You may have constipation. You may develop hemorrhoids. You may develop swollen, bulging veins (varicose veins) in your legs. You may have increased body aches in the pelvis, back, or thighs. This is due to weight gain and increased hormones that are relaxing your joints. You may have increased tingling or numbness in your hands, arms, and legs. The skin on your abdomen may also feel numb. You may feel short of breath because of your expanding uterus. Other changes You may urinate more often because the fetus is moving lower into your pelvis  and pressing on your bladder. You may have more problems sleeping. This may be caused by the size of your abdomen, an increased need to urinate, and an increase in your body's metabolism. You may notice the fetus "dropping," or moving lower in your abdomen (lightening). You may have increased vaginal discharge. You may notice that you have pain around your pelvic bone as your uterus distends. Follow these instructions at home: Medicines Follow your health care provider's instructions regarding medicine use. Specific medicines may be either safe or unsafe to take during pregnancy. Do not take any medicines unless approved by your health care provider. Take a prenatal vitamin that contains at least 600 micrograms (mcg) of folic acid. Eating and drinking Eat a healthy diet that includes fresh fruits and vegetables, whole grains, good sources of protein such as meat, eggs, or tofu, and low-fat dairy products. Avoid raw meat and unpasteurized juice, milk, and cheese. These carry germs that can harm you and your baby. Eat 4 or 5 small meals rather than 3 large meals a day. You may need to take these actions to prevent or treat constipation: Drink enough fluid to keep your urine pale yellow. Eat foods that are high in fiber, such as beans, whole grains, and fresh fruits and vegetables. Limit foods that are high in fat and processed sugars, such as fried or sweet foods. Activity Exercise only as directed by your health care provider. Most people can continue their usual exercise routine during pregnancy. Try to   exercise for 30 minutes at least 5 days a week. Stop exercising if you experience contractions in the uterus. Stop exercising if you develop pain or cramping in the lower abdomen or lower back. Avoid heavy lifting. Do not exercise if it is very hot or humid or if you are at a high altitude. If you choose to, you may continue to have sex unless your health care provider tells you not  to. Relieving pain and discomfort Take frequent breaks and rest with your legs raised (elevated) if you have leg cramps or low back pain. Take warm sitz baths to soothe any pain or discomfort caused by hemorrhoids. Use hemorrhoid cream if your health care provider approves. Wear a supportive bra to prevent discomfort from breast tenderness. If you develop varicose veins: Wear support hose as told by your health care provider. Elevate your feet for 15 minutes, 3-4 times a day. Limit salt in your diet. Safety Talk to your health care provider before traveling far distances. Do not use hot tubs, steam rooms, or saunas. Wear your seat belt at all times when driving or riding in a car. Talk with your health care provider if someone is verbally or physically abusive to you. Preparing for birth To prepare for the arrival of your baby: Take prenatal classes to understand, practice, and ask questions about labor and delivery. Visit the hospital and tour the maternity area. Purchase a rear-facing car seat and make sure you know how to install it in your car. Prepare the baby's room or sleeping area. Make sure to remove all pillows and stuffed animals from the baby's crib to prevent suffocation. General instructions Avoid cat litter boxes and soil used by cats. These carry germs that can cause birth defects in the baby. If you have a cat, ask someone to clean the litter box for you. Do not douche or use tampons. Do not use scented sanitary pads. Do not use any products that contain nicotine or tobacco, such as cigarettes, e-cigarettes, and chewing tobacco. If you need help quitting, ask your health care provider. Do not use any herbal remedies, illegal drugs, or medicines that were not prescribed to you. Chemicals in these products can harm your baby. Do not drink alcohol. You will have more frequent prenatal exams during the third trimester. During a routine prenatal visit, your health care provider  will do a physical exam, perform tests, and discuss your overall health. Keep all follow-up visits. This is important. Where to find more information American Pregnancy Association: americanpregnancy.org American College of Obstetricians and Gynecologists: acog.org/en/Womens%20Health/Pregnancy Office on Women's Health: womenshealth.gov/pregnancy Contact a health care provider if you have: A fever. Mild pelvic cramps, pelvic pressure, or nagging pain in your abdominal area or lower back. Vomiting or diarrhea. Bad-smelling vaginal discharge or foul-smelling urine. Pain when you urinate. A headache that does not go away when you take medicine. Visual changes or see spots in front of your eyes. Get help right away if: Your water breaks. You have regular contractions less than 5 minutes apart. You have spotting or bleeding from your vagina. You have severe abdominal pain. You have difficulty breathing. You have chest pain. You have fainting spells. You have not felt your baby move for the time period told by your health care provider. You have new or increased pain, swelling, or redness in an arm or leg. Summary The third trimester of pregnancy is from week 28 through week 40 (months 7 through 9). You may have more problems sleeping.   This can be caused by the size of your abdomen, an increased need to urinate, and an increase in your body's metabolism. You will have more frequent prenatal exams during the third trimester. Keep all follow-up visits. This is important. This information is not intended to replace advice given to you by your health care provider. Make sure you discuss any questions you have with your health care provider. Document Revised: 01/14/2020 Document Reviewed: 11/20/2019 Elsevier Patient Education  2022 Else[employer] Inc[employer] Iron-Rich Diet Iron is a mineral that helps your body produce hemoglobin. Hemoglobin is a protein in red blood cells that carries oxygen to your body's  tissues. Eating too little iron may cause you to feel weak and tired, and it can increase your risk of infection. Iron is naturally found in many foods, and many foods have iron added to them (are iron-fortified). You may need to follow an iron-rich diet if you do not have enough iron in your body due to certain medical conditions. The amount of iron that you need each day depends on your age, your sex, and any medical conditions you have. Follow instructions from your health care provider or a dietitian about how much iron you should eat each day. What are tips for following this plan? Reading food labels Check food labels to see how many milligrams (mg) of iron are in each serving. Cooking Cook foods in pots and pans that are made from iron. Take these steps to make it easier for your body to absorb iron from certain foods: Soak beans overnight before cooking. Soak whole grains overnight and drain them before using. Ferment flours before baking, such as by using yeast in bread dough. Meal planning When you eat foods that contain iron, you should eat them with foods that are high in vitamin C. These include oranges, peppers, tomatoes, potatoes, and mangoes. Vitamin C helps your body absorb iron. Certain foods and drinks prevent your body from absorbing iron properly. Avoid eating these foods in the same meal as iron-rich foods or with iron supplements. These foods include: Coffee, black tea, and red wine. Milk, dairy products, and foods that are high in calcium. Beans and soybeans. Whole grains. General information Take iron supplements only as told by your health care provider. An overdose of iron can be life-threatening. If you were prescribed iron supplements, take them with orange juice or a vitamin C supplement. When you eat iron-fortified foods or take an iron supplement, you should also eat foods that naturally contain iron, such as meat, poultry, and fish. Eating naturally iron-rich  foods helps your body absorb the iron that is added to other foods or contained in a supplement. Iron from animal sources is better absorbed than iron from plant sources. What foods should I eat? Fruits Prunes. Raisins. Eat fruits high in vitamin C, such as oranges, grapefruits, and strawberries, with iron-rich foods. Vegetables Spinach (cooked). Green peas. Broccoli. Fermented vegetables. Eat vegetables high in vitamin C, such as leafy greens, potatoes, bell peppers, and tomatoes, with iron-rich foods. Grains Iron-fortified breakfast cereal. Iron-fortified whole-wheat bread. Enriched rice. Sprouted grains. Meats and other proteins Beef liver. Beef. Turkey. Chicken. Oysters. Shrimp. Tuna. Sardines. Chickpeas. Nuts. Tofu. Pumpkin seeds. Beverages Tomato juice. Fresh orange juice. Prune juice. Hibiscus tea. Iron-fortified instant breakfast shakes. Sweets and desserts Blackstrap molasses. Seasonings and condiments Tahini. Fermented soy sauce. Other foods Wheat germ. The items listed above may not be a complete list of recommended foods and beverages. Contact a dietitian for more information. What   foods should I limit? These are foods that should be limited while eating iron-rich foods as they can reduce the absorption of iron in your body. Grains Whole grains. Bran cereal. Bran flour. Meats and other proteins Soybeans. Products made from soy protein. Black beans. Lentils. Mung beans. Split peas. Dairy Milk. Cream. Cheese. Yogurt. Cottage cheese. Beverages Coffee. Black tea. Red wine. Sweets and desserts Cocoa. Chocolate. Ice cream. Seasonings and condiments Basil. Oregano. Large amounts of parsley. The items listed above may not be a complete list of foods and beverages you should limit. Contact a dietitian for more information. Summary Iron is a mineral that helps your body produce hemoglobin. Hemoglobin is a protein in red blood cells that carries oxygen to your body's  tissues. Iron is naturally found in many foods, and many foods have iron added to them (are iron-fortified). When you eat foods that contain iron, you should eat them with foods that are high in vitamin C. Vitamin C helps your body absorb iron. Certain foods and drinks prevent your body from absorbing iron properly, such as whole grains and dairy products. You should avoid eating these foods in the same meal as iron-rich foods or with iron supplements. This information is not intended to replace advice given to you by your health care provider. Make sure you discuss any questions you have with your health care provider. Document Revised: 07/19/2020 Document Reviewed: 07/19/2020 Elsevier Patient Education  2022 Elsevier Inc. Fetal Movement Counts Patient Name: ________________________________________________ Patient Due Date: ____________________ What is a fetal movement count? A fetal movement count is the number of times that you feel your baby move during a certain amount of time. This may also be called a fetal kick count. A fetal movement count is recommended for every pregnant woman. You may be asked to start counting fetal movements as early as week 28 of your pregnancy. Pay attention to when your baby is most active. You may notice your baby's sleep and wake cycles. You may also notice things that make your baby move more. You should do a fetal movement count: When your baby is normally most active. At the same time each day. A good time to count movements is while you are resting, after having something to eat and drink. How do I count fetal movements? Find a quiet, comfortable area. Sit, or lie down on your side. Write down the date, the start time and stop time, and the number of movements that you felt between those two times. Take this information with you to your health care visits. Write down your start time when you feel the first movement. Count kicks, flutters, swishes, rolls,  and jabs. You should feel at least 10 movements. You may stop counting after you have felt 10 movements, or if you have been counting for 2 hours. Write down the stop time. If you do not feel 10 movements in 2 hours, contact your health care provider for further instructions. Your health care provider may want to do additional tests to assess your baby's well-being. Contact a health care provider if: You feel fewer than 10 movements in 2 hours. Your baby is not moving like he or she usually does. Date: ____________ Start time: ____________ Stop time: ____________ Movements: ____________ Date: ____________ Start time: ____________ Stop time: ____________ Movements: ____________ Date: ____________ Start time: ____________ Stop time: ____________ Movements: ____________ Date: ____________ Start time: ____________ Stop time: ____________ Movements: ____________ Date: ____________ Start time: ____________ Stop time: ____________ Movements: ____________ Date: ____________   Start time: ____________ Stop time: ____________ Movements: ____________ Date: ____________ Start time: ____________ Stop time: ____________ Movements: ____________ Date: ____________ Start time: ____________ Stop time: ____________ Movements: ____________ Date: ____________ Start time: ____________ Stop time: ____________ Movements: ____________ This information is not intended to replace advice given to you by your health care provider. Make sure you discuss any questions you have with your health care provider. Document Revised: 03/27/2019 Document Reviewed: 03/27/2019 Elsevier Patient Education  2022 Elsevier Inc.  

## 2021-08-18 NOTE — Progress Notes (Signed)
°  HIGH-RISK PREGNANCY OFFICE VISIT Patient name: Brittney Cummings MRN [id number]  Date of birth: 06/19/1996 Chief Complaint:   Routine Prenatal Visit  History of Present Illness:   Brittney Cummings is a 25 y.o. [id number] female at [redacted]w[redacted]d with an Estimated Date of Delivery: 11/11/21 being seen today for ongoing management of a high-risk pregnancy complicated by [facility] currently on Labetalol 100 mg BID Today she reports  had some cough, congestion and general feelings of not feeling well over the weekend . Tested negative for flu. Contractions: Not present. Vag. Bleeding: None.  Movement: Present. denies leaking of fluid.  Review of Systems:   Pertinent items are noted in HPI Denies abnormal vaginal discharge w/ itching/odor/irritation, headaches, visual changes, shortness of breath, chest pain, abdominal pain, severe nausea/vomiting, or problems with urination or bowel movements unless otherwise stated above. Pertinent History Reviewed:  Reviewed past medical,surgical, social, obstetrical and family history.  Reviewed problem list, medications and allergies. Physical Assessment:   Vitals:   08/18/21 0845 08/18/21 0948  BP: (!) 133/95 121/84  Pulse: 86 81  Temp: 97.8 F (36.6 C)   Weight: 171 lb 6.4 oz (77.7 kg)   Body mass index is 33.47 kg/m.           Physical Examination:   General appearance: alert, well appearing, and in no distress, oriented to person, place, and time, and overweight  Mental status: alert, oriented to person, place, and time, normal mood, behavior, speech, dress, motor activity, and thought processes  Skin: warm & dry   Extremities: Edema: None    Cardiovascular: normal heart rate noted  Respiratory: normal respiratory effort, no distress  Abdomen: gravid, soft, non-tender  Pelvic: Cervical exam deferred         Fetal Status: Fetal Heart Rate (bpm): 153   Movement: Present    Fetal Surveillance Testing today: none   No results found for this or any  previous visit (from the past 24 hour(s)).  Assessment & Plan:  1) High-risk pregnancy G3P2002 at [redacted]w[redacted]d with an Estimated Date of Delivery: 11/11/21   2) Supervision of high risk pregnancy, antepartum  - Glucose Tolerance, 2 Hours w/1 Hour,  - HIV Antibody (routine testing w rflx),  - RPR,  - CBC,  - Tdap vaccine greater than or equal to 7yo IM  3) [redacted] weeks gestation of pregnancy   4) Chronic hypertension affecting pregnancy - Continue with Labetalol 100 mg BID  5) Obesity affecting pregnancy in first trimester  - Taking daily bASA  Meds: No orders of the defined types were placed in this encounter.   Labs/procedures today: 2 hr GTT, 3rd trimester labs and TdaP  Treatment Plan:  continue current plan  Reviewed: Preterm labor symptoms and general obstetric precautions including but not limited to vaginal bleeding, contractions, leaking of fluid and fetal movement were reviewed in detail with the patient.  All questions were answered. Has home bp cuff. Check bp weekly, let us know if >140/90.   Follow-up: No follow-ups on file.  Orders Placed This Encounter  Procedures   Tdap vaccine greater than or equal to 7yo IM   Glucose Tolerance, 2 Hours w/1 Hour   HIV Antibody (routine testing w rflx)   RPR   CBC   Raelyn Mora MSN, CNM 08/18/2021 9:44 AM

## 2021-08-19 ENCOUNTER — Ambulatory Visit: Payer: No Typology Code available for payment source | Admitting: *Deleted

## 2021-08-19 ENCOUNTER — Other Ambulatory Visit (HOSPITAL_COMMUNITY): Payer: Self-pay

## 2021-08-19 ENCOUNTER — Ambulatory Visit: Payer: No Typology Code available for payment source | Attending: Maternal & Fetal Medicine

## 2021-08-19 VITALS — BP 130/82 | HR 87

## 2021-08-19 DIAGNOSIS — O99213 Obesity complicating pregnancy, third trimester: Secondary | ICD-10-CM | POA: Diagnosis not present

## 2021-08-19 DIAGNOSIS — O10913 Unspecified pre-existing hypertension complicating pregnancy, third trimester: Secondary | ICD-10-CM

## 2021-08-19 DIAGNOSIS — O10912 Unspecified pre-existing hypertension complicating pregnancy, second trimester: Secondary | ICD-10-CM

## 2021-08-19 DIAGNOSIS — O10013 Pre-existing essential hypertension complicating pregnancy, third trimester: Secondary | ICD-10-CM

## 2021-08-19 DIAGNOSIS — Z6838 Body mass index (BMI) 38.0-38.9, adult: Secondary | ICD-10-CM

## 2021-08-19 DIAGNOSIS — Z3A28 28 weeks gestation of pregnancy: Secondary | ICD-10-CM | POA: Insufficient documentation

## 2021-08-19 LAB — CBC
Hematocrit: 33.6 % — ABNORMAL LOW (ref 34.0–46.6)
Hemoglobin: 11 g/dL — ABNORMAL LOW (ref 11.1–15.9)
MCH: 29.5 pg (ref 26.6–33.0)
MCHC: 32.7 g/dL (ref 31.5–35.7)
MCV: 90 fL (ref 79–97)
Platelets: 310 10*3/uL (ref 150–450)
RBC: 3.73 x10E6/uL — ABNORMAL LOW (ref 3.77–5.28)
RDW: 12.6 % (ref 11.7–15.4)
WBC: 6.7 10*3/uL (ref 3.4–10.8)

## 2021-08-19 LAB — RPR: RPR Ser Ql: NONREACTIVE

## 2021-08-19 LAB — HIV ANTIBODY (ROUTINE TESTING W REFLEX): HIV Screen 4th Generation wRfx: NONREACTIVE

## 2021-08-19 LAB — GLUCOSE TOLERANCE, 2 HOURS W/ 1HR
Glucose, 1 hour: 90 mg/dL (ref 70–179)
Glucose, 2 hour: 79 mg/dL (ref 70–152)
Glucose, Fasting: 73 mg/dL (ref 70–91)

## 2021-08-21 NOTE — L&D Delivery Note (Signed)
[facility] Delivery Note  Brittney Cummings is a 26 y.o. G3P2002 s/p NSVD at [redacted]w[redacted]d. She was admitted for IOL 2/2 CHTN with superimposed pre-eclampsia with severe features.   ROM: 0h 78m with clear fluid GBS Status: positive Maximum Maternal Temperature: 98.5  Labor Progress: Admitted for IOL. Patient had 4 doses of cytotec and then pitocin was started. Shortly after starting pitocin patient had rapid progress to complete dilation.   Delivery Date/Time: 10/09/21 @1254pm  Delivery: Called to room and patient was complete and pushing. Head delivered LOA. No nuchal cord present. Shoulder and body delivered in usual fashion. Infant with spontaneous cry, placed on mother's abdomen, dried and stimulated. Cord clamped x 2, and cut by CNM. Baby handed to awaiting NICU team for evaluation. Cord blood drawn. Placenta delivered spontaneously with gentle cord traction. Fundus firm with massage and Pitocin. Labia, perineum, vagina, and cervix inspected inspected with no lacerations.   Placenta: spontaneous/complete/intact Complications: Preterm delivery, Pre-eclampsia with severe features.  Lacerations: None EBL: 100cc Analgesia: epidural  Postpartum Planning [X]  message to sent to schedule follow-up  [X]  vaccines UTD  Infant: Female   APGARs 6/9   1960g  DNP, CNM  10/09/21  1:10 PM

## 2021-08-23 ENCOUNTER — Other Ambulatory Visit: Payer: Self-pay | Admitting: *Deleted

## 2021-08-23 DIAGNOSIS — O10919 Unspecified pre-existing hypertension complicating pregnancy, unspecified trimester: Secondary | ICD-10-CM

## 2021-08-23 DIAGNOSIS — Z6833 Body mass index (BMI) 33.0-33.9, adult: Secondary | ICD-10-CM

## 2021-08-24 ENCOUNTER — Other Ambulatory Visit (HOSPITAL_COMMUNITY): Payer: Self-pay

## 2021-08-31 ENCOUNTER — Other Ambulatory Visit: Payer: Self-pay

## 2021-08-31 ENCOUNTER — Ambulatory Visit (INDEPENDENT_AMBULATORY_CARE_PROVIDER_SITE_OTHER): Payer: No Typology Code available for payment source | Admitting: Certified Nurse Midwife

## 2021-08-31 VITALS — BP 114/79 | HR 84 | Wt 175.2 lb

## 2021-08-31 DIAGNOSIS — Z3493 Encounter for supervision of normal pregnancy, unspecified, third trimester: Secondary | ICD-10-CM

## 2021-08-31 DIAGNOSIS — Z3A29 29 weeks gestation of pregnancy: Secondary | ICD-10-CM

## 2021-08-31 DIAGNOSIS — O10919 Unspecified pre-existing hypertension complicating pregnancy, unspecified trimester: Secondary | ICD-10-CM

## 2021-08-31 NOTE — Progress Notes (Signed)
° °  PRENATAL VISIT NOTE  Subjective:  Brittney Cummings is a 26 y.o. G3P2002 at [redacted]w[redacted]d being seen today for ongoing prenatal care.  She is currently monitored for the following issues for this low-risk pregnancy and has Chronic headaches; Sore throat; Pelvic pain; COVID-19; Back pain; Elevated blood pressure reading; Lumbar radiculopathy, right; Shortness of breath; General counselling and advice on contraception; Healthcare maintenance; Supervision of high risk pregnancy, antepartum; Chronic hypertension affecting pregnancy; and Obesity affecting pregnancy in first trimester on their problem list.  Patient reports no complaints.  Contractions: Not present. Vag. Bleeding: None.  Movement: Present. Denies leaking of fluid.   The following portions of the patient's history were reviewed and updated as appropriate: allergies, current medications, past family history, past medical history, past social history, past surgical history and problem list.   Objective:   Vitals:   08/31/21 1535  BP: 114/79  Pulse: 84  Weight: 175 lb 3.2 oz (79.5 kg)    Fetal Status: Fetal Heart Rate (bpm): 145 Fundal Height: 29 cm Movement: Present     General:  Alert, oriented and cooperative. Patient is in no acute distress.  Skin: Skin is warm and dry. No rash noted.   Cardiovascular: Normal heart rate noted  Respiratory: Normal respiratory effort, no problems with respiration noted  Abdomen: Soft, gravid, appropriate for gestational age.  Pain/Pressure: Absent     Pelvic: Cervical exam deferred        Extremities: Normal range of motion.  Edema: None  Mental Status: Normal mood and affect. Normal behavior. Normal judgment and thought content.   Assessment and Plan:  Pregnancy: G3P2002 at [redacted]w[redacted]d 1. Supervision of low-risk pregnancy, third trimester - Doing well, feeling regular and vigorous fetal movement   2. [redacted] weeks gestation of pregnancy - Routine OB care   3. Chronic hypertension affecting  pregnancy - Stable on 100mg  labetalol BID, no s/sx PEC  Preterm labor symptoms and general obstetric precautions including but not limited to vaginal bleeding, contractions, leaking of fluid and fetal movement were reviewed in detail with the patient. Please refer to After Visit Summary for other counseling recommendations.   No follow-ups on file.  Future Appointments  Date Time Provider Department Center  09/15/2021  4:15 PM 09/17/2021, CNM [facility] None  09/16/2021  2:30 PM [facility] NURSE [facility]  09/16/2021  2:45 PM [facility] US4 [facility]  09/22/2021  7:45 AM [facility] NURSE [facility]  09/22/2021  8:00 AM [facility] US1 [facility]  09/28/2021  3:55 PM 11/26/2021, CNM [facility] None  09/29/2021  7:30 AM [facility] NURSE [facility]  09/29/2021  7:45 AM [facility] US5 [facility]  10/14/2021 11:15 AM 10/16/2021, NP [facility] None  10/27/2021  3:55 PM 12/27/2021, CNM [facility] None  11/03/2021  3:55 PM 11/05/2021, CNM [facility] None  11/09/2021  3:55 PM 11/11/2021, CNM [facility] None  11/16/2021  4:15 PM 11/18/2021, CNM [facility] None    Bernerd Limbo, CNM

## 2021-09-15 ENCOUNTER — Other Ambulatory Visit: Payer: Self-pay

## 2021-09-15 ENCOUNTER — Ambulatory Visit (INDEPENDENT_AMBULATORY_CARE_PROVIDER_SITE_OTHER): Payer: No Typology Code available for payment source | Admitting: Obstetrics and Gynecology

## 2021-09-15 VITALS — BP 142/91 | HR 79 | Temp 98.1°F | Wt 176.2 lb

## 2021-09-15 DIAGNOSIS — O0990 Supervision of high risk pregnancy, unspecified, unspecified trimester: Secondary | ICD-10-CM

## 2021-09-15 DIAGNOSIS — Z3A31 31 weeks gestation of pregnancy: Secondary | ICD-10-CM

## 2021-09-15 DIAGNOSIS — O99211 Obesity complicating pregnancy, first trimester: Secondary | ICD-10-CM

## 2021-09-15 DIAGNOSIS — O10919 Unspecified pre-existing hypertension complicating pregnancy, unspecified trimester: Secondary | ICD-10-CM

## 2021-09-16 ENCOUNTER — Ambulatory Visit: Payer: No Typology Code available for payment source | Admitting: *Deleted

## 2021-09-16 ENCOUNTER — Ambulatory Visit: Payer: No Typology Code available for payment source | Attending: Obstetrics and Gynecology

## 2021-09-16 VITALS — BP 123/76 | HR 84

## 2021-09-16 DIAGNOSIS — O10013 Pre-existing essential hypertension complicating pregnancy, third trimester: Secondary | ICD-10-CM | POA: Diagnosis not present

## 2021-09-16 DIAGNOSIS — E669 Obesity, unspecified: Secondary | ICD-10-CM

## 2021-09-16 DIAGNOSIS — Z3A32 32 weeks gestation of pregnancy: Secondary | ICD-10-CM

## 2021-09-16 DIAGNOSIS — O10919 Unspecified pre-existing hypertension complicating pregnancy, unspecified trimester: Secondary | ICD-10-CM

## 2021-09-16 DIAGNOSIS — O10913 Unspecified pre-existing hypertension complicating pregnancy, third trimester: Secondary | ICD-10-CM | POA: Diagnosis present

## 2021-09-16 DIAGNOSIS — Z6833 Body mass index (BMI) 33.0-33.9, adult: Secondary | ICD-10-CM

## 2021-09-16 DIAGNOSIS — O99213 Obesity complicating pregnancy, third trimester: Secondary | ICD-10-CM

## 2021-09-16 NOTE — Progress Notes (Signed)
°  [facility] VISIT Patient name: Brittney Cummings MRN [id number]  Date of birth: February 12, 1996 Chief Complaint:   Routine Prenatal Visit  History of Present Illness:   Brittney Cummings is a 26 y.o. [id number] female at [redacted]w[redacted]d with an Estimated Date of Delivery: 11/11/21 being seen today for ongoing management of a high-risk pregnancy complicated by [facility] currently on Labetalol 100 mg BID Today she reports  BH ctxs . Contractions: Irregular. Vag. Bleeding: None.  Movement: Present. denies leaking of fluid.  Review of Systems:   Pertinent items are noted in HPI Denies abnormal vaginal discharge w/ itching/odor/irritation, headaches, visual changes, shortness of breath, chest pain, abdominal pain, severe nausea/vomiting, or problems with urination or bowel movements unless otherwise stated above. Pertinent History Reviewed:  Reviewed past medical,surgical, social, obstetrical and family history.  Reviewed problem list, medications and allergies. Physical Assessment:   Vitals:   09/15/21 1613  BP: (!) 142/91  Pulse: 79  Temp: 98.1 F (36.7 C)  Weight: 176 lb 3.2 oz (79.9 kg)  Body mass index is 34.41 kg/m.           Physical Examination:   General appearance: alert, well appearing, and in no distress, oriented to person, place, and time, and overweight  Mental status: alert, oriented to person, place, and time, normal mood, behavior, speech, dress, motor activity, and thought processes  Skin: warm & dry   Extremities: Edema: None    Cardiovascular: normal heart rate noted  Respiratory: normal respiratory effort, no distress  Abdomen: gravid, soft, non-tender  Pelvic: Cervical exam deferred         Fetal Status: Fetal Heart Rate (bpm): 153 Fundal Height: 32 cm Movement: Present Presentation: Undeterminable  Fetal Surveillance Testing today: none   No results found for this or any previous visit (from the past 24 hour(s)).  Assessment & Plan:  1) High-risk  pregnancy G3P2002 at [redacted]w[redacted]d with an Estimated Date of Delivery: 11/11/21   2) Supervision of high risk pregnancy, antepartum - Anticipatory guidance for GBS screening at 36 wks. Explained the test is important to be done at this time in pregnancy to ensure adequate treatment at the time of delivery. Explained that a positive result does not mean any harm to her, but can be harmful to the baby. Meaning that if baby is exposed to the bacteria for too long without antibiotics, the baby has the potential to develop pneumonia, septicemia, or spinal meningitis and could end up in the NICU. Also, explained that a cervical exam may be performed at the time of testing to get a baseline cervical check and make sure there is no preterm cervical dilation.   3) Chronic hypertension affecting pregnancy - Recheck BP tomorrow - Continue taking medication as previously prescribed  4) Obesity affecting pregnancy in first trimester - Taking bASA daily  5) [redacted] weeks gestation of pregnancy   Meds: No orders of the defined types were placed in this encounter.   Labs/procedures today: none  Treatment Plan:  continue antenatal testing  Reviewed: Preterm labor symptoms and general obstetric precautions including but not limited to vaginal bleeding, contractions, leaking of fluid and fetal movement were reviewed in detail with the patient.  All questions were answered. Has home bp cuff. Check bp weekly, let us know if >140/90.   Follow-up: No follow-ups on file.  No orders of the defined types were placed in this encounter.  Laury Deep MSN, CNM 09/15/2021 4:30 PM

## 2021-09-22 ENCOUNTER — Ambulatory Visit: Payer: No Typology Code available for payment source | Admitting: *Deleted

## 2021-09-22 ENCOUNTER — Other Ambulatory Visit: Payer: Self-pay | Admitting: *Deleted

## 2021-09-22 ENCOUNTER — Ambulatory Visit: Payer: No Typology Code available for payment source | Attending: Obstetrics and Gynecology

## 2021-09-22 ENCOUNTER — Other Ambulatory Visit: Payer: Self-pay

## 2021-09-22 ENCOUNTER — Encounter: Payer: Self-pay | Admitting: *Deleted

## 2021-09-22 VITALS — BP 136/79 | HR 74

## 2021-09-22 DIAGNOSIS — Z3A32 32 weeks gestation of pregnancy: Secondary | ICD-10-CM

## 2021-09-22 DIAGNOSIS — O10013 Pre-existing essential hypertension complicating pregnancy, third trimester: Secondary | ICD-10-CM

## 2021-09-22 DIAGNOSIS — E669 Obesity, unspecified: Secondary | ICD-10-CM | POA: Diagnosis not present

## 2021-09-22 DIAGNOSIS — O10919 Unspecified pre-existing hypertension complicating pregnancy, unspecified trimester: Secondary | ICD-10-CM | POA: Diagnosis present

## 2021-09-22 DIAGNOSIS — O10913 Unspecified pre-existing hypertension complicating pregnancy, third trimester: Secondary | ICD-10-CM

## 2021-09-22 DIAGNOSIS — Z6833 Body mass index (BMI) 33.0-33.9, adult: Secondary | ICD-10-CM | POA: Diagnosis present

## 2021-09-22 DIAGNOSIS — O99213 Obesity complicating pregnancy, third trimester: Secondary | ICD-10-CM

## 2021-09-22 DIAGNOSIS — Z6835 Body mass index (BMI) 35.0-35.9, adult: Secondary | ICD-10-CM

## 2021-09-28 ENCOUNTER — Telehealth (HOSPITAL_BASED_OUTPATIENT_CLINIC_OR_DEPARTMENT_OTHER): Payer: Self-pay | Admitting: Obstetrics & Gynecology

## 2021-09-28 ENCOUNTER — Other Ambulatory Visit: Payer: Self-pay

## 2021-09-28 ENCOUNTER — Ambulatory Visit (INDEPENDENT_AMBULATORY_CARE_PROVIDER_SITE_OTHER): Payer: Self-pay

## 2021-09-28 VITALS — BP 127/89 | HR 78 | Wt 177.6 lb

## 2021-09-28 DIAGNOSIS — O0990 Supervision of high risk pregnancy, unspecified, unspecified trimester: Secondary | ICD-10-CM

## 2021-09-28 DIAGNOSIS — M5431 Sciatica, right side: Secondary | ICD-10-CM

## 2021-09-28 DIAGNOSIS — Z0289 Encounter for other administrative examinations: Secondary | ICD-10-CM

## 2021-09-28 DIAGNOSIS — Z3A33 33 weeks gestation of pregnancy: Secondary | ICD-10-CM

## 2021-09-28 DIAGNOSIS — O10919 Unspecified pre-existing hypertension complicating pregnancy, unspecified trimester: Secondary | ICD-10-CM

## 2021-09-28 DIAGNOSIS — M5432 Sciatica, left side: Secondary | ICD-10-CM

## 2021-09-28 NOTE — Telephone Encounter (Signed)
Called patient and left a message to call the office to set up the appointment.

## 2021-09-28 NOTE — Progress Notes (Signed)
° °  [facility] VISIT  Patient name: Brittney Cummings MRN [id number]  Date of birth: 09/16/1995 Chief Complaint:   Routine Prenatal Visit  Subjective:   Brittney Cummings is a 26 y.o. [id number] female at [redacted]w[redacted]d with an Estimated Date of Delivery: 11/11/21 being seen today for ongoing management of a high-risk pregnancy aeb has Chronic headaches; Sore throat; Pelvic pain; COVID-19; Back pain; Elevated blood pressure reading; Lumbar radiculopathy, right; Shortness of breath; General counselling and advice on contraception; Healthcare maintenance; Supervision of high risk pregnancy, antepartum; Chronic hypertension affecting pregnancy; and Obesity affecting pregnancy in first trimester on their problem list.  Patient presents today with  c/o bilateral sciatica pain . She reports pain is triggered with standing, but is relieved with rest prior to ambulation.  She reports some back pain, but states it is mild and tolerable.  Patient endorses fetal movement. Patient denies abdominal cramping, but has occasion BH contractions.  Patient denies vaginal concerns including abnormal discharge, leaking of fluid, and bleeding.  Contractions: Irritability. Vag. Bleeding: None.  Movement: Present.  Reviewed past medical,surgical, social, obstetrical and family history as well as problem list, medications and allergies.  Objective   Vitals:   09/28/21 1548  BP: 127/89  Pulse: 78  Weight: 177 lb 9.6 oz (80.6 kg)  Body mass index is 34.69 kg/m.  Total Weight Gain:3 lb 9.6 oz (1.633 kg)         Physical Examination:   General appearance: Well appearing, and in no distress  Mental status: Alert, oriented to person, place, and time  Skin: Warm & dry  Cardiovascular: Normal heart rate noted  Respiratory: Normal respiratory effort, no distress  Abdomen: Soft, gravid, nontender, AGA with Fundal Height: 35 cm  Pelvic: Cervical exam deferred           Extremities: Edema: None  Fetal  Status: Fetal Heart Rate (bpm): 148  Movement: Present   No results found for this or any previous visit (from the past 24 hour(s)).  Assessment & Plan:  High-risk pregnancy of a 26 y.o., G3P2002 at [redacted]w[redacted]d with an Estimated Date of Delivery: 11/11/21   1. Supervision of high risk pregnancy, antepartum -Anticipatory guidance for upcoming appts. -Patient to schedule next appt in 2 weeks for an in-person visit. -Patient aware of office closing and will be transferring to [place] location.  2. Chronic hypertension affecting pregnancy -Taking labetalol and bASA  3. [redacted] weeks gestation of pregnancy -Doing well with exception as below.    4. Bilateral sciatica -Discussed c/o sciatica -Encouraged to monitor and report worsening of symptoms. -Informed that PT is available and helps with some relief of symptoms.      Meds: No orders of the defined types were placed in this encounter.  Labs/procedures today:  Lab Orders  No laboratory test(s) ordered today     Reviewed: Preterm labor symptoms and general obstetric precautions including but not limited to vaginal bleeding, contractions, leaking of fluid and fetal movement were reviewed in detail with the patient.  All questions were answered.  Follow-up: No follow-ups on file.  No orders of the defined types were placed in this encounter.  Maryann Conners MSN, CNM 09/28/2021

## 2021-09-29 ENCOUNTER — Encounter: Payer: Self-pay | Admitting: *Deleted

## 2021-09-29 ENCOUNTER — Ambulatory Visit: Payer: No Typology Code available for payment source | Admitting: *Deleted

## 2021-09-29 ENCOUNTER — Ambulatory Visit: Payer: No Typology Code available for payment source | Attending: Obstetrics and Gynecology

## 2021-09-29 ENCOUNTER — Encounter (HOSPITAL_BASED_OUTPATIENT_CLINIC_OR_DEPARTMENT_OTHER): Payer: Self-pay

## 2021-09-29 VITALS — BP 144/100 | HR 69

## 2021-09-29 DIAGNOSIS — E669 Obesity, unspecified: Secondary | ICD-10-CM

## 2021-09-29 DIAGNOSIS — O10013 Pre-existing essential hypertension complicating pregnancy, third trimester: Secondary | ICD-10-CM

## 2021-09-29 DIAGNOSIS — O10913 Unspecified pre-existing hypertension complicating pregnancy, third trimester: Secondary | ICD-10-CM | POA: Insufficient documentation

## 2021-09-29 DIAGNOSIS — Z6833 Body mass index (BMI) 33.0-33.9, adult: Secondary | ICD-10-CM

## 2021-09-29 DIAGNOSIS — O99213 Obesity complicating pregnancy, third trimester: Secondary | ICD-10-CM

## 2021-09-29 DIAGNOSIS — Z3A33 33 weeks gestation of pregnancy: Secondary | ICD-10-CM | POA: Diagnosis not present

## 2021-09-29 DIAGNOSIS — O10919 Unspecified pre-existing hypertension complicating pregnancy, unspecified trimester: Secondary | ICD-10-CM

## 2021-09-30 ENCOUNTER — Encounter (HOSPITAL_BASED_OUTPATIENT_CLINIC_OR_DEPARTMENT_OTHER): Payer: Self-pay | Admitting: *Deleted

## 2021-10-06 ENCOUNTER — Ambulatory Visit (HOSPITAL_BASED_OUTPATIENT_CLINIC_OR_DEPARTMENT_OTHER): Payer: No Typology Code available for payment source | Admitting: Obstetrics

## 2021-10-06 ENCOUNTER — Ambulatory Visit: Payer: No Typology Code available for payment source | Admitting: *Deleted

## 2021-10-06 ENCOUNTER — Inpatient Hospital Stay (HOSPITAL_COMMUNITY)
Admission: AD | Admit: 2021-10-06 | Discharge: 2021-10-11 | DRG: 807 | Disposition: A | Discharge status: Home / Self Care | Payer: No Typology Code available for payment source | Attending: Obstetrics and Gynecology | Admitting: Obstetrics and Gynecology

## 2021-10-06 ENCOUNTER — Encounter (HOSPITAL_COMMUNITY): Payer: Self-pay | Admitting: Obstetrics & Gynecology

## 2021-10-06 ENCOUNTER — Ambulatory Visit (HOSPITAL_BASED_OUTPATIENT_CLINIC_OR_DEPARTMENT_OTHER): Payer: No Typology Code available for payment source

## 2021-10-06 ENCOUNTER — Other Ambulatory Visit: Payer: Self-pay

## 2021-10-06 VITALS — BP 146/99 | HR 89

## 2021-10-06 DIAGNOSIS — B951 Streptococcus, group B, as the cause of diseases classified elsewhere: Secondary | ICD-10-CM | POA: Diagnosis present

## 2021-10-06 DIAGNOSIS — Z3A35 35 weeks gestation of pregnancy: Secondary | ICD-10-CM | POA: Diagnosis not present

## 2021-10-06 DIAGNOSIS — E668 Other obesity: Secondary | ICD-10-CM | POA: Diagnosis not present

## 2021-10-06 DIAGNOSIS — Z7982 Long term (current) use of aspirin: Secondary | ICD-10-CM

## 2021-10-06 DIAGNOSIS — O99213 Obesity complicating pregnancy, third trimester: Secondary | ICD-10-CM | POA: Insufficient documentation

## 2021-10-06 DIAGNOSIS — O99214 Obesity complicating childbirth: Secondary | ICD-10-CM | POA: Diagnosis present

## 2021-10-06 DIAGNOSIS — O0990 Supervision of high risk pregnancy, unspecified, unspecified trimester: Secondary | ICD-10-CM

## 2021-10-06 DIAGNOSIS — O10913 Unspecified pre-existing hypertension complicating pregnancy, third trimester: Secondary | ICD-10-CM | POA: Insufficient documentation

## 2021-10-06 DIAGNOSIS — O99284 Endocrine, nutritional and metabolic diseases complicating childbirth: Secondary | ICD-10-CM | POA: Diagnosis not present

## 2021-10-06 DIAGNOSIS — G8929 Other chronic pain: Secondary | ICD-10-CM

## 2021-10-06 DIAGNOSIS — Z3A34 34 weeks gestation of pregnancy: Secondary | ICD-10-CM

## 2021-10-06 DIAGNOSIS — O10919 Unspecified pre-existing hypertension complicating pregnancy, unspecified trimester: Secondary | ICD-10-CM | POA: Diagnosis not present

## 2021-10-06 DIAGNOSIS — O1414 Severe pre-eclampsia complicating childbirth: Secondary | ICD-10-CM | POA: Diagnosis not present

## 2021-10-06 DIAGNOSIS — Z8616 Personal history of COVID-19: Secondary | ICD-10-CM | POA: Diagnosis not present

## 2021-10-06 DIAGNOSIS — Z91018 Allergy to other foods: Secondary | ICD-10-CM

## 2021-10-06 DIAGNOSIS — Z20822 Contact with and (suspected) exposure to covid-19: Secondary | ICD-10-CM | POA: Diagnosis present

## 2021-10-06 DIAGNOSIS — O10013 Pre-existing essential hypertension complicating pregnancy, third trimester: Secondary | ICD-10-CM

## 2021-10-06 DIAGNOSIS — T474X5A Adverse effect of other laxatives, initial encounter: Secondary | ICD-10-CM | POA: Diagnosis not present

## 2021-10-06 DIAGNOSIS — O1410 Severe pre-eclampsia, unspecified trimester: Secondary | ICD-10-CM | POA: Diagnosis present

## 2021-10-06 DIAGNOSIS — Z6835 Body mass index (BMI) 35.0-35.9, adult: Secondary | ICD-10-CM | POA: Insufficient documentation

## 2021-10-06 DIAGNOSIS — O119 Pre-existing hypertension with pre-eclampsia, unspecified trimester: Secondary | ICD-10-CM | POA: Diagnosis present

## 2021-10-06 DIAGNOSIS — R519 Headache, unspecified: Secondary | ICD-10-CM | POA: Diagnosis not present

## 2021-10-06 DIAGNOSIS — O99210 Obesity complicating pregnancy, unspecified trimester: Secondary | ICD-10-CM | POA: Diagnosis present

## 2021-10-06 DIAGNOSIS — Z8249 Family history of ischemic heart disease and other diseases of the circulatory system: Secondary | ICD-10-CM

## 2021-10-06 DIAGNOSIS — O99824 Streptococcus B carrier state complicating childbirth: Secondary | ICD-10-CM | POA: Diagnosis not present

## 2021-10-06 DIAGNOSIS — O1413 Severe pre-eclampsia, third trimester: Secondary | ICD-10-CM | POA: Diagnosis present

## 2021-10-06 DIAGNOSIS — O114 Pre-existing hypertension with pre-eclampsia, complicating childbirth: Secondary | ICD-10-CM | POA: Diagnosis present

## 2021-10-06 DIAGNOSIS — O26893 Other specified pregnancy related conditions, third trimester: Secondary | ICD-10-CM | POA: Diagnosis not present

## 2021-10-06 LAB — COMPREHENSIVE METABOLIC PANEL
ALT: 15 U/L (ref 0–44)
AST: 19 U/L (ref 15–41)
Albumin: 2.6 g/dL — ABNORMAL LOW (ref 3.5–5.0)
Alkaline Phosphatase: 102 U/L (ref 38–126)
Anion gap: 8 (ref 5–15)
BUN: 8 mg/dL (ref 6–20)
CO2: 18 mmol/L — ABNORMAL LOW (ref 22–32)
Calcium: 8.5 mg/dL — ABNORMAL LOW (ref 8.9–10.3)
Chloride: 110 mmol/L (ref 98–111)
Creatinine, Ser: 0.59 mg/dL (ref 0.44–1.00)
GFR, Estimated: >60 mL/min (ref >60)
Glucose, Bld: 102 mg/dL — ABNORMAL HIGH (ref 70–99)
Potassium: 3.6 mmol/L (ref 3.5–5.1)
Sodium: 136 mmol/L (ref 135–145)
Total Bilirubin: 0.1 mg/dL — ABNORMAL LOW (ref 0.3–1.2)
Total Protein: 6.3 g/dL — ABNORMAL LOW (ref 6.5–8.1)

## 2021-10-06 LAB — RESP PANEL BY RT-PCR (FLU A&B, COVID) ARPGX2
Influenza A by PCR: NEGATIVE
Influenza B by PCR: NEGATIVE
SARS Coronavirus 2 by RT PCR: NEGATIVE

## 2021-10-06 LAB — URINALYSIS, ROUTINE W REFLEX MICROSCOPIC
Bilirubin Urine: NEGATIVE
Glucose, UA: NEGATIVE mg/dL
Hgb urine dipstick: NEGATIVE
Ketones, ur: NEGATIVE mg/dL
Leukocytes,Ua: NEGATIVE
Nitrite: NEGATIVE
Protein, ur: 30 mg/dL — AB
Specific Gravity, Urine: 1.02 (ref 1.005–1.030)
pH: 6 (ref 5.0–8.0)

## 2021-10-06 LAB — CBC
HCT: 30.8 % — ABNORMAL LOW (ref 36.0–46.0)
Hemoglobin: 10.3 g/dL — ABNORMAL LOW (ref 12.0–15.0)
MCH: 28.9 pg (ref 26.0–34.0)
MCHC: 33.4 g/dL (ref 30.0–36.0)
MCV: 86.5 fL (ref 80.0–100.0)
Platelets: 297 10*3/uL (ref 150–400)
RBC: 3.56 MIL/uL — ABNORMAL LOW (ref 3.87–5.11)
RDW: 12.9 % (ref 11.5–15.5)
WBC: 7.6 10*3/uL (ref 4.0–10.5)
nRBC: 0 % (ref 0.0–0.2)

## 2021-10-06 LAB — PROTEIN / CREATININE RATIO, URINE
Creatinine, Urine: 195.42 mg/dL
Protein Creatinine Ratio: 0.23 mg/mg{Cre} — ABNORMAL HIGH (ref 0.00–0.15)
Total Protein, Urine: 45 mg/dL

## 2021-10-06 LAB — OB RESULTS CONSOLE GBS: GBS: POSITIVE

## 2021-10-06 LAB — TYPE AND SCREEN
ABO/RH(D): O POS
Antibody Screen: NEGATIVE

## 2021-10-06 LAB — OB RESULTS CONSOLE GC/CHLAMYDIA: Gonorrhea: NEGATIVE

## 2021-10-06 MED ORDER — LABETALOL HCL 5 MG/ML IV SOLN: 80.0000 mg | INTRAVENOUS | Status: DC | PRN

## 2021-10-06 MED ORDER — ONDANSETRON HCL 4 MG/2ML IJ SOLN: 4.0000 mg | Freq: Four times a day (QID) | INTRAMUSCULAR | Status: DC | PRN

## 2021-10-06 MED ORDER — LACTATED RINGERS IV SOLN: INTRAVENOUS | Status: DC

## 2021-10-06 MED ORDER — MAGNESIUM SULFATE BOLUS VIA INFUSION
4.0000 g | Freq: Once | INTRAVENOUS | Status: AC
  Administered 2021-10-06: 4 g via INTRAVENOUS
  Filled 2021-10-06: qty 1000

## 2021-10-06 MED ORDER — HYDRALAZINE HCL 20 MG/ML IJ SOLN: 10.0000 mg | INTRAMUSCULAR | Status: DC | PRN

## 2021-10-06 MED ORDER — CYCLOBENZAPRINE HCL 5 MG PO TABS
10.0000 mg | ORAL_TABLET | Freq: Once | ORAL | Status: AC
  Administered 2021-10-06: 10 mg via ORAL
  Filled 2021-10-06: qty 2

## 2021-10-06 MED ORDER — LACTATED RINGERS IV SOLN: 500.0000 mL | INTRAVENOUS | Status: DC | PRN

## 2021-10-06 MED ORDER — OXYTOCIN-SODIUM CHLORIDE 30-0.9 UT/500ML-% IV SOLN: 2.5000 [IU]/h | INTRAVENOUS | Status: DC

## 2021-10-06 MED ORDER — CALCIUM CARBONATE ANTACID 500 MG PO CHEW: 2.0000 | CHEWABLE_TABLET | ORAL | Status: DC | PRN

## 2021-10-06 MED ORDER — MAGNESIUM SULFATE 40 GM/1000ML IV SOLN
2.0000 g/h | INTRAVENOUS | Status: DC
  Administered 2021-10-06: 2 g/h via INTRAVENOUS
  Filled 2021-10-06: qty 1000

## 2021-10-06 MED ORDER — DOCUSATE SODIUM 100 MG PO CAPS: 100.0000 mg | ORAL_CAPSULE | Freq: Every day | ORAL | Status: DC

## 2021-10-06 MED ORDER — ZOLPIDEM TARTRATE 5 MG PO TABS: 5.0000 mg | ORAL_TABLET | Freq: Every evening | ORAL | Status: DC | PRN

## 2021-10-06 MED ORDER — LABETALOL HCL 5 MG/ML IV SOLN
20.0000 mg | INTRAVENOUS | Status: DC | PRN
  Administered 2021-10-08 – 2021-10-09 (×2): 20 mg via INTRAVENOUS
  Filled 2021-10-06 (×2): qty 4

## 2021-10-06 MED ORDER — LABETALOL HCL 200 MG PO TABS
200.0000 mg | ORAL_TABLET | Freq: Two times a day (BID) | ORAL | Status: DC
  Administered 2021-10-06 – 2021-10-08 (×4): 200 mg via ORAL
  Filled 2021-10-06 (×4): qty 1

## 2021-10-06 MED ORDER — BETAMETHASONE SOD PHOS & ACET 6 (3-3) MG/ML IJ SUSP
12.0000 mg | INTRAMUSCULAR | Status: DC
  Filled 2021-10-06: qty 5

## 2021-10-06 MED ORDER — ACETAMINOPHEN 500 MG PO TABS
1000.0000 mg | ORAL_TABLET | Freq: Once | ORAL | Status: AC
  Administered 2021-10-06: 1000 mg via ORAL
  Filled 2021-10-06: qty 2

## 2021-10-06 MED ORDER — PENICILLIN G POT IN DEXTROSE 60000 UNIT/ML IV SOLN: 3.0000 10*6.[IU] | INTRAVENOUS | Status: DC

## 2021-10-06 MED ORDER — OXYTOCIN BOLUS FROM INFUSION: 333.0000 mL | Freq: Once | INTRAVENOUS | Status: DC

## 2021-10-06 MED ORDER — METOCLOPRAMIDE HCL 10 MG PO TABS
10.0000 mg | ORAL_TABLET | Freq: Once | ORAL | Status: AC
  Administered 2021-10-06: 10 mg via ORAL
  Filled 2021-10-06: qty 1

## 2021-10-06 MED ORDER — LIDOCAINE HCL (PF) 1 % IJ SOLN: 30.0000 mL | INTRAMUSCULAR | Status: DC | PRN

## 2021-10-06 MED ORDER — LABETALOL HCL 5 MG/ML IV SOLN
40.0000 mg | INTRAVENOUS | Status: DC | PRN
  Administered 2021-10-08: 40 mg via INTRAVENOUS
  Filled 2021-10-06: qty 8

## 2021-10-06 MED ORDER — SOD CITRATE-CITRIC ACID 500-334 MG/5ML PO SOLN: 30.0000 mL | ORAL | Status: DC | PRN

## 2021-10-06 MED ORDER — ACETAMINOPHEN 325 MG PO TABS: 650.0000 mg | ORAL_TABLET | ORAL | Status: DC | PRN

## 2021-10-06 MED ORDER — FENTANYL CITRATE (PF) 100 MCG/2ML IJ SOLN: 50.0000 ug | INTRAMUSCULAR | Status: DC | PRN

## 2021-10-06 MED ORDER — PRENATAL MULTIVITAMIN CH
1.0000 | ORAL_TABLET | Freq: Every day | ORAL | Status: DC
  Administered 2021-10-07 – 2021-10-08 (×2): 1 via ORAL
  Filled 2021-10-06 (×2): qty 1

## 2021-10-06 MED ORDER — SODIUM CHLORIDE 0.9 % IV SOLN: 5.0000 10*6.[IU] | Freq: Once | INTRAVENOUS | Status: DC

## 2021-10-06 MED ORDER — HYDROXYZINE HCL 50 MG PO TABS: 50.0000 mg | ORAL_TABLET | Freq: Four times a day (QID) | ORAL | Status: DC | PRN

## 2021-10-06 MED ORDER — LABETALOL HCL 100 MG PO TABS
100.0000 mg | ORAL_TABLET | Freq: Once | ORAL | Status: AC
  Administered 2021-10-06: 100 mg via ORAL
  Filled 2021-10-06: qty 1

## 2021-10-06 MED ORDER — OXYCODONE-ACETAMINOPHEN 5-325 MG PO TABS: 2.0000 | ORAL_TABLET | ORAL | Status: DC | PRN

## 2021-10-06 MED ORDER — DOCUSATE SODIUM 100 MG PO CAPS: 100.0000 mg | ORAL_CAPSULE | Freq: Two times a day (BID) | ORAL | Status: DC | PRN

## 2021-10-06 NOTE — Progress Notes (Signed)
MFM Note  Brittney Cummings was seen for a BPP due to chronic hypertension that is treated with labetalol.  The patient has been complaining of the severe headache since yesterday.  Her blood pressures in our office today were 146/99 and 153/101.  A biophysical profile performed today was 8 out of 8.    There was normal amniotic fluid noted on today's ultrasound exam.  Her deep tendon reflexes were 2+ bilaterally.    Due to her elevated blood pressures and the persistent headache, the patient was sent to the MAU for preeclampsia evaluation.    The patient understands that should she be diagnosed with preeclampsia, that delivery will be recommended.  Should preeclampsia be ruled out, she will return in 1 week for another BPP.  The patient stated that all of her questions were answered today.  A total of 20 minutes was spent counseling and coordinating the care for this patient.  Greater than 50% of the time was spent in direct face-to-face contact.

## 2021-10-06 NOTE — MAU Note (Signed)
.  Brittney Cummings is a 26 y.o. at [redacted]w[redacted]d here in MAU reporting: HTN accompanied by headache an swelling. States she took her labetalol at 12. Has not taken tylenol. Denies VB or LOF. Endorses good fetal movement.   Pain score: head- 4  FHT:138 Lab orders placed from triage:  UA

## 2021-10-06 NOTE — Plan of Care (Signed)
Problem: Education: Goal: Knowledge of General Education information will improve Description: Including pain rating scale, medication(s)/side effects and non-pharmacologic comfort measures Outcome: Completed/Met   Problem: Activity: Goal: Risk for activity intolerance will decrease Outcome: Completed/Met   Problem: Nutrition: Goal: Adequate nutrition will be maintained Outcome: Completed/Met   Problem: Coping: Goal: Level of anxiety will decrease Outcome: Completed/Met   Problem: Elimination: Goal: Will not experience complications related to urinary retention Outcome: Completed/Met   Problem: Education: Goal: Knowledge of disease or condition will improve Outcome: Completed/Met   Problem: Education: Goal: Knowledge of disease or condition will improve Outcome: Completed/Met Goal: Knowledge of the prescribed therapeutic regimen will improve Outcome: Completed/Met

## 2021-10-06 NOTE — H&P (Signed)
OBSTETRIC ADMISSION HISTORY AND PHYSICAL  Brittney Cummings is a 26 y.o. female G3P2002 with IUP at [redacted]w[redacted]d by LMP presenting for headache and elevated blood pressures. Patient has a history of chronic HTN on Labetalol 100 mg BID. She was at her MFM appointment earlier today when they notified her that her blood pressure was elevated. She also had a mild headache, therefore, she was advised to go to MAU for further evaluation. In the MAU, patient had mild range blood pressures, with one severe range blood pressure of 166/101. She received an additional 100 mg of PO Labetalol for her blood pressure. She was treated with Tylenol, Flexeril, and Reglan for her headache with minimal improvement. Due to her elevated BP and persistent headache, patient was started on magnesium for severe pre-eclampsia and admitted to L&D for IOL.  Upon arrival to L&D, patient's headache has resolved. She is concerned about IOL at 34 weeks and neonatal outcomes. She reports that her blood pressures range from 120s-140s/70s-90s at home on average. She suspects that her headaches are a result of not eating regularly yesterday. She would like to know if there are any alternatives to IOL at this time.  She received her prenatal care at [facility].   Dating: By LMP --->  Estimated Date of Delivery: 11/11/21  Sono:   @[redacted]w[redacted]d , CWD, normal anatomy, cephalic presentation, anterior placental lie, 1707 g, 16% EFW  Prenatal History/Complications:  --Chronic hypertension --Elevated BMI (35)  Past Medical History: Past Medical History:  Diagnosis Date   Hypertension     Past Surgical History: Past Surgical History:  Procedure Laterality Date   NO PAST SURGERIES      Obstetrical History: OB History     Gravida  3   Para  2   Term  2   Preterm      AB      Living  2      SAB      IAB      Ectopic      Multiple      Live Births  2           Social History Social History   Socioeconomic  History   Marital status: Married    Spouse name: Not on file   Number of children: 3   Years of education: Not on file   Highest education level: Not on file  Occupational History   Not on file  Tobacco Use   Smoking status: Never    Passive exposure: Never   Smokeless tobacco: Never  Vaping Use   Vaping Use: Never used  Substance and Sexual Activity   Alcohol use: No   Drug use: No   Sexual activity: Yes    Birth control/protection: Condom, None    Comment: preg  Other Topics Concern   Not on file  Social History Narrative   Not on file   Social Determinants of Health   Financial Resource Strain: Not on file  Food Insecurity: Not on file  Transportation Needs: Not on file  Physical Activity: Not on file  Stress: Not on file  Social Connections: Not on file    Family History: Family History  Problem Relation Age of Onset   Seizures Mother    High Cholesterol Father    Hypertension Father    Diabetes Father     Allergies: Allergies  Allergen Reactions   Mushroom Extract Complex Swelling    Angioedema (tongue and lips, trouble breathing, treated w/epi pen)  Medications Prior to Admission  Medication Sig Dispense Refill Last Dose   aspirin EC 81 MG tablet Take 81 mg by mouth daily. Swallow whole.   10/06/2021 at 0840   labetalol (NORMODYNE) 100 MG tablet Take 1 tablet (100 mg total) by mouth 2 (two) times daily. 180 tablet 3 10/06/2021 at 0840   Prenatal Vit-Fe Fumarate-FA (MULTIVITAMIN-PRENATAL) 27-0.8 MG TABS tablet Take 1 tablet by mouth daily at 12 noon.   10/06/2021 at 0840     Review of Systems  All systems reviewed and negative except as stated in HPI  Blood pressure (!) 155/97, pulse 68, temperature 98.3 F (36.8 C), temperature source Oral, resp. rate 18, height 4\' 11"  (1.499 m), weight 80 kg, last menstrual period 02/04/2021, SpO2 99 %.  General appearance: Alert, cooperative, in no acute distress Lungs: Normal work of breath on room  air Heart: Regular rate, warm and well perfused Abdomen: Soft, non-tender; gravid Extremities: No LE edema, no calf tenderness to palpation  Fetal monitoring: Baseline FHR 125 bpm, moderate variability, +accels, no decels Uterine activity: None   Prenatal labs: ABO, Rh: --/--/O POS (02/16 1040) Antibody: NEG (02/16 1040) Rubella: 8.50 (08/04 1004) RPR: Non Reactive (12/29 0855)  HBsAg: Negative (08/04 1004)  HIV: Non Reactive (12/29 0855)  GBS:  Pending  2 hr Glucola: Normal Genetic screening: LR NIPS, negative [employer] US: Normal  Prenatal Transfer Tool  Maternal Diabetes: No Genetic Screening: Normal Maternal Ultrasounds/Referrals: Normal Fetal Ultrasounds or other Referrals:  None Maternal Substance Abuse:  No Significant Maternal Medications:  Meds include: Other: Labetalol 100 mg BID Significant Maternal Lab Results: GBS unknown  Results for orders placed or performed during the hospital encounter of 10/06/21 (from the past 24 hour(s))  Urinalysis, Routine w reflex microscopic Urine, Clean Catch   Collection Time: 10/06/21 10:07 AM  Result Value Ref Range   Color, Urine YELLOW YELLOW   APPearance HAZY (A) CLEAR   Specific Gravity, Urine 1.020 1.005 - 1.030   pH 6.0 5.0 - 8.0   Glucose, UA NEGATIVE NEGATIVE mg/dL   Hgb urine dipstick NEGATIVE NEGATIVE   Bilirubin Urine NEGATIVE NEGATIVE   Ketones, ur NEGATIVE NEGATIVE mg/dL   Protein, ur 30 (A) NEGATIVE mg/dL   Nitrite NEGATIVE NEGATIVE   Leukocytes,Ua NEGATIVE NEGATIVE   RBC / HPF 0-5 0 - 5 RBC/hpf   WBC, UA 0-5 0 - 5 WBC/hpf   Bacteria, UA RARE (A) NONE SEEN   Squamous Epithelial / LPF 0-5 0 - 5   Mucus PRESENT   Protein / creatinine ratio, urine   Collection Time: 10/06/21 10:07 AM  Result Value Ref Range   Creatinine, Urine 195.42 mg/dL   Total Protein, Urine 45 mg/dL   Protein Creatinine Ratio 0.23 (H) 0.00 - 0.15 mg/mg[Cre]  CBC   Collection Time: 10/06/21 10:40 AM  Result Value Ref Range    WBC 7.6 4.0 - 10.5 K/uL   RBC 3.56 (L) 3.87 - 5.11 MIL/uL   Hemoglobin 10.3 (L) 12.0 - 15.0 g/dL   HCT 95.1 (L) 88.4 - 16.6 %   MCV 86.5 80.0 - 100.0 fL   MCH 28.9 26.0 - 34.0 pg   MCHC 33.4 30.0 - 36.0 g/dL   RDW 06.3 01.6 - 01.0 %   Platelets 297 150 - 400 K/uL   nRBC 0.0 0.0 - 0.2 %  Comprehensive metabolic panel   Collection Time: 10/06/21 10:40 AM  Result Value Ref Range   Sodium 136 135 - 145 mmol/L   Potassium  3.6 3.5 - 5.1 mmol/L   Chloride 110 98 - 111 mmol/L   CO2 18 (L) 22 - 32 mmol/L   Glucose, Bld 102 (H) 70 - 99 mg/dL   BUN 8 6 - 20 mg/dL   Creatinine, Ser 9.37 0.44 - 1.00 mg/dL   Calcium 8.5 (L) 8.9 - 10.3 mg/dL   Total Protein 6.3 (L) 6.5 - 8.1 g/dL   Albumin 2.6 (L) 3.5 - 5.0 g/dL   AST 19 15 - 41 U/L   ALT 15 0 - 44 U/L   Alkaline Phosphatase 102 38 - 126 U/L   Total Bilirubin 0.1 (L) 0.3 - 1.2 mg/dL   GFR, Estimated >90 >24 mL/min   Anion gap 8 5 - 15  Type and screen [facility]   Collection Time: 10/06/21 10:40 AM  Result Value Ref Range   ABO/RH(D) O POS    Antibody Screen NEG    Sample Expiration      10/09/2021,2359 Performed at [facility], [address] [place], [place] [address]   Resp Panel by RT-PCR (Flu A&B, Covid) Nasopharyngeal Swab   Collection Time: 10/06/21 12:53 PM   Specimen: Nasopharyngeal Swab; Nasopharyngeal(NP) swabs in vial transport medium  Result Value Ref Range   SARS Coronavirus 2 by RT PCR NEGATIVE NEGATIVE   Influenza A by PCR NEGATIVE NEGATIVE   Influenza B by PCR NEGATIVE NEGATIVE    Patient Active Problem List   Diagnosis Date Noted   Severe pre-eclampsia 10/06/2021   Maternal obesity, antepartum 04/21/2021   Chronic hypertension affecting pregnancy 04/13/2021   Supervision of high risk pregnancy, antepartum 03/24/2021   General counselling and advice on contraception 01/25/2021   Healthcare maintenance 01/25/2021   Shortness of breath 12/30/2020   Lumbar radiculopathy, right  11/26/2020   Back pain 10/12/2020   Elevated blood pressure reading 10/12/2020   COVID-19 09/01/2020   Pelvic pain 07/08/2020   Sore throat 01/29/2020   Chronic headaches 07/25/2019    Assessment/Plan:  Brittney Cummings is a 26 y.o. G3P2002 at [redacted]w[redacted]d with headaches and elevated BP.  Patient initially admitted to L&D for induction due to persistent headache and elevated blood pressures from baseline, meeting criteria for cHTN with superimposed pre-eclampsia with severe features. Headache now resolved, BP remains mild range. No other symptoms of pre-eclampsia at this time. Discussed options with patient. Will discontinue magnesium and admit to antepartum for further observation. Discussed that if her symptoms return or her blood pressures worsen, delivery would be recommended. Patient voiced understanding and is agreeable to plan. Plan of care discussed with Drs. Debroah Loop and Wouk who are in agreement with management.  #cHTN: #Headache: - HA now resolved s/p treatments in MAU - Pre-E labs normal at this time; BP mild range - Magnesium discontinued  - BMZ initially ordered, discussed indications and neonatal benefits, patient declined for now  - Repeat CBC, CMP, UPC in AM to assess trend  - Will continue to monitor BP and symptoms - Will increase home Labetalol to 200 mg BID  Dispo: Pending further BP observation and stabilization.   Worthy Rancher, MD 10/06/2021, 4:02 PM

## 2021-10-06 NOTE — MAU Provider Note (Signed)
History     [id number]  Arrival date and time: 10/06/21 0957    Chief Complaint  Patient presents with   Hypertension   Headache     HPI Brittney Cummings is a 26 y.o. at [redacted]w[redacted]d by 9wk [place] with PMHx notable for cHTN on labetalol, chronic headaches, who presents from MFM after she was found to have elevated BP's above her baseline and unrelenting headache since the day prior.   Patient was seen in MFM this morning for routine BPP which was 8/8 During visit she reported to Dr. Parke Poisson that she's had a headache since yesterday that was not relieved with tylenol BP's also noted to be higher than her recent baseline At that point she was directed to MAU for PreE evaluation  On my history patient reports she has been compliant with her labetalol Has had a headache that started yesterday afternoon and has continued today First time she has had one so consistent in this pregnancy, but does endorse a hx of chronic headaches Has used fioricet pre pregnancy which was helpful but seemed to be less so once she got pregnant Denies vision changes, chest pain, shortness of breath, RUQ pain Does not think she is particularly swollen but was told to take off her rings at MFM because of how swollen her fingers look Denies vaginal bleeding, leaking fluid, or contractions Endorses good fetal movement   O/Positive/-- (08/04 1004)  OB History     Gravida  3   Para  2   Term  2   Preterm      AB      Living  2      SAB      IAB      Ectopic      Multiple      Live Births  2           Past Medical History:  Diagnosis Date   Hypertension     Past Surgical History:  Procedure Laterality Date   NO PAST SURGERIES      Family History  Problem Relation Age of Onset   Seizures Mother    High Cholesterol Father    Hypertension Father    Diabetes Father     Social History   Socioeconomic History   Marital status: Married    Spouse name: Not on file   Number of  children: 3   Years of education: Not on file   Highest education level: Not on file  Occupational History   Not on file  Tobacco Use   Smoking status: Never    Passive exposure: Never   Smokeless tobacco: Never  Vaping Use   Vaping Use: Never used  Substance and Sexual Activity   Alcohol use: No   Drug use: No   Sexual activity: Yes    Birth control/protection: Condom, None    Comment: preg  Other Topics Concern   Not on file  Social History Narrative   Not on file   Social Determinants of Health   Financial Resource Strain: Not on file  Food Insecurity: Not on file  Transportation Needs: Not on file  Physical Activity: Not on file  Stress: Not on file  Social Connections: Not on file  Intimate Partner Violence: Not on file    Allergies  Allergen Reactions   Mushroom Extract Complex Swelling    Angioedema (tongue and lips, trouble breathing, treated w/epi pen)    No current facility-administered medications on file prior to  encounter.   Current Outpatient Medications on File Prior to Encounter  Medication Sig Dispense Refill   aspirin EC 81 MG tablet Take 81 mg by mouth daily. Swallow whole.     labetalol (NORMODYNE) 100 MG tablet Take 1 tablet (100 mg total) by mouth 2 (two) times daily. 180 tablet 3   Prenatal Vit-Fe Fumarate-FA (MULTIVITAMIN-PRENATAL) 27-0.8 MG TABS tablet Take 1 tablet by mouth daily at 12 noon.       ROS Pertinent positives and negative per HPI, all others reviewed and negative  Physical Exam   BP (!) 148/105    Pulse 79    Temp 98.5 F (36.9 C) (Oral)    Resp 17    Ht 4\' 11"  (1.499 m)    Wt 80 kg    LMP 02/04/2021 (Exact Date)    SpO2 99%    BMI 35.63 kg/m   Patient Vitals for the past 24 hrs:  BP Temp Temp src Pulse Resp SpO2 Height Weight  10/06/21 1045 (!) 148/105 -- -- 79 -- 99 % -- --  10/06/21 1030 (!) 148/101 -- -- 86 -- 99 % -- --  10/06/21 1022 (!) 152/105 98.5 F (36.9 C) Oral 88 17 98 % 4\' 11"  (1.499 m) 80 kg     Physical Exam Vitals reviewed.  Constitutional:      General: She is not in acute distress.    Appearance: She is well-developed. She is not diaphoretic.  Eyes:     General: No scleral icterus. Pulmonary:     Effort: Pulmonary effort is normal. No respiratory distress.  Abdominal:     General: There is no distension.     Palpations: Abdomen is soft.     Tenderness: There is no abdominal tenderness. There is no guarding or rebound.  Musculoskeletal:        General: Swelling present.     Right lower leg: 1+ Pitting Edema present.     Left lower leg: 1+ Pitting Edema present.  Skin:    General: Skin is warm and dry.  Neurological:     Mental Status: She is alert.     Coordination: Coordination normal.     Cervical Exam    Bedside Ultrasound Not done  My interpretation: n/a  FHT Baseline 130, moderate variability, +accels, no decels Toco: quiet Cat: I  Labs Results for orders placed or performed during the hospital encounter of 10/06/21 (from the past 24 hour(s))  CBC     Status: Abnormal   Collection Time: 10/06/21 10:40 AM  Result Value Ref Range   WBC 7.6 4.0 - 10.5 K/uL   RBC 3.56 (L) 3.87 - 5.11 MIL/uL   Hemoglobin 10.3 (L) 12.0 - 15.0 g/dL   HCT 62.130.8 (L) 30.836.0 - 65.746.0 %   MCV 86.5 80.0 - 100.0 fL   MCH 28.9 26.0 - 34.0 pg   MCHC 33.4 30.0 - 36.0 g/dL   RDW 84.612.9 96.211.5 - 95.215.5 %   Platelets 297 150 - 400 K/uL   nRBC 0.0 0.0 - 0.2 %    Imaging US MFM FETAL BPP WO NON STRESS  Result Date: 10/06/2021 ----------------------------------------------------------------------  OBSTETRICS REPORT                       (Signed Final 10/06/2021 10:04 am) ---------------------------------------------------------------------- Patient Info  ID #:       [id number]  D.O.B.:  22-Nov-1995 (25 yrs)  Name:       Brittney Cummings                   Visit Date: 10/06/2021 09:06 am              HERNANDEZ  ---------------------------------------------------------------------- Performed By  Attending:        Ma Rings MD         Ref. Address:     [address]                                                             [place], [place]                                                             [address]  Performed By:     Clayton Lefort RDMS       [facility]  Referred By:      Raelyn Mora CNM ---------------------------------------------------------------------- Orders  #  Description                           Code        Ordered By  1  [place] MFM FETAL BPP WO NON               93235.57    RAVI SHANKAR     STRESS ----------------------------------------------------------------------  #  Order #  Accession #                Episode #  1  [id number]                   [id number]                 [id number] ---------------------------------------------------------------------- Indications  Hypertension - Chronic/Pre-existing            O10.019  (Labetalol)  Obesity complicating pregnancy, third          O99.213  trimester BMI 35  [redacted] weeks gestation of pregnancy                Z3A.34 ---------------------------------------------------------------------- Fetal Evaluation  Num Of Fetuses:         1  Fetal Heart Rate(bpm):  166  Cardiac Activity:       Observed  Presentation:           Cephalic  Placenta:               Anterior  P. Cord Insertion:      Previously Visualized  Amniotic Fluid  AFI FV:      Within normal limits  AFI Sum(cm)     %Tile       Largest Pocket(cm)  8.94            12          2.94  RUQ(cm)       RLQ(cm)       LUQ(cm)        LLQ(cm)  2.94          1.4           2.3             2.3 ---------------------------------------------------------------------- Biophysical Evaluation  Amniotic F.V:   Within normal limits       F. Tone:        Observed  F. Movement:    Observed                   Score:          8/8  F. Breathing:   Observed ---------------------------------------------------------------------- Biometry  LV:        2.9  mm ---------------------------------------------------------------------- Gestational Age  LMP:           34w 6d        Date:  02/04/21                 EDD:   11/11/21  Best:          34w 6d     Det. By:  LMP  (02/04/21)          EDD:   11/11/21 ---------------------------------------------------------------------- Anatomy  Ventricles:            Appears normal         Kidneys:                Appear normal  Heart:                 Appears normal         Bladder:                Appears normal                         (4CH, axis, and  situs)  Stomach:               Appears normal, left                         sided ---------------------------------------------------------------------- Cervix Uterus Adnexa  Cervix  Not visualized (advanced GA >24wks)  Right Ovary  Visualized.  Left Ovary  Visualized. ---------------------------------------------------------------------- Comments  Lula OlszewskiGloria Vargas Hernandez was seen for a BPP due to chronic  hypertension that is treated with labetalol.  The patient has  been complaining of the severe headache since yesterday.  Her blood pressures in our office today were 146/99 and  153/101.  A biophysical profile performed today was 8 out of 8.  There was normal amniotic fluid noted on today's ultrasound  exam.  Her deep tendon reflexes were 2+ bilaterally.  Due to her elevated blood pressures and the persistent  headache, the patient was sent to the MAU for preeclampsia  evaluation.  The patient understands that should she be diagnosed with  preeclampsia, that delivery will be recommended.  Should  preeclampsia be  ruled out, she will return in 1 week for  another BPP.  The patient stated that all of her questions were answered  today.  A total of 20 minutes was spent counseling and coordinating  the care for this patient.  Greater than 50% of the time was  spent in direct face-to-face contact. ----------------------------------------------------------------------                   Ma RingsVictor Fang, MD Electronically Signed Final Report   10/06/2021 10:04 am ----------------------------------------------------------------------   MAU Course  Procedures Lab Orders         Urinalysis, Routine w reflex microscopic Urine, Clean Catch         CBC         Comprehensive metabolic panel         Protein / creatinine ratio, urine    Meds ordered this encounter  Medications   cyclobenzaprine (FLEXERIL) tablet 10 mg   acetaminophen (TYLENOL) tablet 1,000 mg   metoCLOPramide (REGLAN) tablet 10 mg   labetalol (NORMODYNE) tablet 100 mg   Imaging Orders  No imaging studies ordered today    MDM High  Assessment and Plan  #Chronic hypertension with superimposed pre-eclampsia with severe features BP's more elevated compared to recent averages of 120-130/70-80's, currently running 140-150s/100's. Has had headache since yesterday and not relieved by tylenol at home, though does have hx of migraines. 1+ swelling in legs bilaterally. Overall picture concerning for developing PreE. Will check PreE labs, treat headache with flexeril, tylenol, and reglan, and increase patient to 200mg  BID of labetalol (will give 100mg  now in MAU). Reassess in a few hours, if not improved will need IOL for severe PreE.   11:14 AM  Patient reports no improvement of headache despite the above interventions. PreE labs without concerning findings but with unremitting headache not responding to medications she meets criteria for cHTN with superimposed PreE with severe features by neurological symptoms. Will proceed with admission for expedited  delivery.   12:51 PM   #FWB FHT Cat I NST: Reactive   Dispo: admit to L&D, signout given to first call and attending   Venora MaplesMatthew M Ender Rorke, MD/MPH 10/06/21 11:11 AM

## 2021-10-07 ENCOUNTER — Encounter (HOSPITAL_COMMUNITY): Payer: Self-pay | Admitting: Family Medicine

## 2021-10-07 DIAGNOSIS — R519 Headache, unspecified: Secondary | ICD-10-CM

## 2021-10-07 DIAGNOSIS — O26893 Other specified pregnancy related conditions, third trimester: Secondary | ICD-10-CM

## 2021-10-07 LAB — CBC
HCT: 31.4 % — ABNORMAL LOW (ref 36.0–46.0)
Hemoglobin: 10.3 g/dL — ABNORMAL LOW (ref 12.0–15.0)
MCH: 29.1 pg (ref 26.0–34.0)
MCHC: 32.8 g/dL (ref 30.0–36.0)
MCV: 88.7 fL (ref 80.0–100.0)
Platelets: 297 10*3/uL (ref 150–400)
RBC: 3.54 MIL/uL — ABNORMAL LOW (ref 3.87–5.11)
RDW: 13.1 % (ref 11.5–15.5)
WBC: 7.4 10*3/uL (ref 4.0–10.5)
nRBC: 0 % (ref 0.0–0.2)

## 2021-10-07 LAB — COMPREHENSIVE METABOLIC PANEL
ALT: 13 U/L (ref 0–44)
AST: 17 U/L (ref 15–41)
Albumin: 2.4 g/dL — ABNORMAL LOW (ref 3.5–5.0)
Alkaline Phosphatase: 87 U/L (ref 38–126)
Anion gap: 7 (ref 5–15)
BUN: 8 mg/dL (ref 6–20)
CO2: 19 mmol/L — ABNORMAL LOW (ref 22–32)
Calcium: 8.1 mg/dL — ABNORMAL LOW (ref 8.9–10.3)
Chloride: 109 mmol/L (ref 98–111)
Creatinine, Ser: 0.66 mg/dL (ref 0.44–1.00)
GFR, Estimated: >60 mL/min (ref >60)
Glucose, Bld: 85 mg/dL (ref 70–99)
Potassium: 4.1 mmol/L (ref 3.5–5.1)
Sodium: 135 mmol/L (ref 135–145)
Total Bilirubin: 0.1 mg/dL — ABNORMAL LOW (ref 0.3–1.2)
Total Protein: 6 g/dL — ABNORMAL LOW (ref 6.5–8.1)

## 2021-10-07 LAB — GC/CHLAMYDIA PROBE AMP (~~LOC~~) NOT AT ARMC
Chlamydia: NEGATIVE
Comment: NEGATIVE
Comment: NORMAL
Neisseria Gonorrhea: NEGATIVE

## 2021-10-07 LAB — PROTEIN / CREATININE RATIO, URINE
Creatinine, Urine: 48.42 mg/dL
Protein Creatinine Ratio: 0.19 mg/mg{Cre} — ABNORMAL HIGH (ref 0.00–0.15)
Total Protein, Urine: 9 mg/dL

## 2021-10-07 LAB — RPR: RPR Ser Ql: NONREACTIVE

## 2021-10-07 NOTE — Plan of Care (Signed)
Problem: Elimination: Goal: Will not experience complications related to bowel motility Outcome: Completed/Met   Problem: Education: Goal: Knowledge of the prescribed therapeutic regimen will improve Outcome: Completed/Met

## 2021-10-07 NOTE — Progress Notes (Signed)
Daily Antepartum Note  Admission Date: 10/06/2021 Current Date: 10/07/2021 7:11 AM  Brittney Cummings is a 26 y.o. [id number] @ [redacted]w[redacted]d, HD#2, admitted for ?severe pre-eclampsia (HA).  Pregnancy complicated by: Patient Active Problem List   Diagnosis Date Noted   Severe pre-eclampsia 10/06/2021   Maternal obesity, antepartum 04/21/2021   Chronic hypertension affecting pregnancy 04/13/2021   Supervision of high risk pregnancy, antepartum 03/24/2021   Lumbar radiculopathy, right 11/26/2020   Chronic headaches 07/25/2019    Overnight/24hr events:  See below  Subjective:  No s/s of pre-eclampsia, decreased FM or labor.   Objective:    Current Vital Signs 24h Vital Sign Ranges  T 98.5 F (36.9 C) Temp  Avg: 98.5 F (36.9 C)  Min: 98.2 F (36.8 C)  Max: 98.6 F (37 C)  BP (!) 126/92 (nurse notified)  BP  Min: 123/88  Max: 166/101  HR 78 Pulse  Avg: 79.4  Min: 63  Max: 127  RR 16 Resp  Avg: 16.5  Min: 14  Max: 18  SaO2 100 % Room Air SpO2  Avg: 98.9 %  Min: 96 %  Max: 100 %       24 Hour I/O Current Shift I/O  Time Ins Outs 02/16 0701 - 02/17 0700 In: 1834.6 [P.O.:1600; I.V.:234.6] Out: 2075 [Urine:2075] No intake/output data recorded.   Patient Vitals for the past 24 hrs:  BP Temp Temp src Pulse Resp SpO2 Height Weight  10/07/21 0403 (!) 126/92 98.5 F (36.9 C) Oral 78 16 100 % -- --  10/06/21 2313 137/78 98.6 F (37 C) Oral 70 16 100 % -- --  10/06/21 1919 127/76 98.6 F (37 C) Oral 68 16 100 % -- --  10/06/21 1635 (!) 151/94 98.2 F (36.8 C) Oral 78 16 99 % -- --  10/06/21 1608 (!) 156/96 -- -- 72 18 -- -- --  10/06/21 1455 (!) 155/97 -- -- 68 18 -- -- --  10/06/21 1428 (!) 148/98 98.3 F (36.8 C) Oral 70 18 -- -- --  10/06/21 1421 -- -- -- -- -- -- 4\' 11"  (1.499 m) 80 kg  10/06/21 1346 140/87 -- -- 69 -- -- -- --  10/06/21 1341 131/81 -- -- 84 -- -- -- --  10/06/21 1336 130/89 -- -- 74 16 99 % -- --  10/06/21 1331 123/88 -- -- 82 -- -- -- --  10/06/21 1325  (!) 157/100 -- -- 63 14 100 % -- --  10/06/21 1316 (!) 123/94 -- -- 76 -- -- -- --  10/06/21 1301 (!) 154/90 -- -- 71 -- -- -- --  10/06/21 1245 (!) 166/101 -- -- 95 -- 98 % -- --  10/06/21 1230 126/89 -- -- 93 -- 96 % -- --  10/06/21 1216 (!) 130/97 -- -- 71 -- -- -- --  10/06/21 1201 (!) 140/91 -- -- 81 -- -- -- --  10/06/21 1146 (!) 143/96 -- -- 67 -- -- -- --  10/06/21 1131 (!) 149/98 -- -- 87 -- -- -- --  10/06/21 1116 (!) 156/97 -- -- (!) 127 -- -- -- --  10/06/21 1101 (!) 139/93 -- -- 78 -- -- -- --  10/06/21 1045 (!) 148/105 -- -- 79 -- 99 % -- --  10/06/21 1030 (!) 148/101 -- -- 86 -- 99 % -- --  10/06/21 1022 (!) 152/105 98.5 F (36.9 C) Oral 88 17 98 % 4\' 11"  (1.499 m) 80 kg   Fetal Heart Tones: 125 baseline, +accels, no decel,  mod variability Tocometry: quiet x 9m  Physical exam: General: Well nourished, well developed female in no acute distress. Abdomen: gravid nttp Cardiovascular: S1, S2 normal, no murmur, rub or gallop, regular rate and rhythm Respiratory: CTAB Extremities: no clubbing, cyanosis or edema Skin: Warm and dry.   Medications: Current Facility-Administered Medications  Medication Dose Route Frequency Provider Last Rate Last Admin   acetaminophen (TYLENOL) tablet 650 mg  650 mg Oral Q4H PRN Genia Del, MD       calcium carbonate (TUMS - dosed in mg elemental calcium) chewable tablet 400 mg of elemental calcium  2 tablet Oral Q4H PRN Genia Del, MD       docusate sodium (COLACE) capsule 100 mg  100 mg Oral BID PRN Aletha Halim, MD       labetalol (NORMODYNE) injection 20 mg  20 mg Intravenous PRN Genia Del, MD       And   labetalol (NORMODYNE) injection 40 mg  40 mg Intravenous PRN Genia Del, MD       And   labetalol (NORMODYNE) injection 80 mg  80 mg Intravenous PRN Genia Del, MD       And   hydrALAZINE (APRESOLINE) injection 10 mg  10 mg Intravenous PRN Genia Del, MD       labetalol  (NORMODYNE) tablet 200 mg  200 mg Oral BID Genia Del, MD   200 mg at 10/06/21 2205   prenatal multivitamin tablet 1 tablet  1 tablet Oral Q1200 Genia Del, MD        Labs:  Recent Labs  Lab 10/06/21 1040 10/07/21 0447  WBC 7.6 7.4  HGB 10.3* 10.3*  HCT 30.8* 31.4*  PLT 297 297    Recent Labs  Lab 10/06/21 1040 10/07/21 0447  NA 136 135  K 3.6 4.1  CL 110 109  CO2 18* 19*  BUN 8 8  CREATININE 0.59 0.66  CALCIUM 8.5* 8.1*  PROT 6.3* 6.0*  BILITOT 0.1* 0.1*  ALKPHOS 102 87  ALT 15 13  AST 19 17  GLUCOSE 102* 85   PC ratio 190 this morning (230 yesterday)  Radiology:  [id number]: cephalic, 8/8, afi 8.9 [id number]: 16%, 123456, ac [id number], cephalic, 8/8, afi 14  Assessment & Plan:  Pt doing well *Pregnancy: reactive NST. Fetal status reassuring *cHTN: pt states she hadn't had anything to eat yesterday until she ate aroune 1430-1500 and then her HA went away and it felt more like a hunger HA; she had no visual s/s. In the MAU she got PO flexeril, reglan and apap and then a few hours later she was started on Mg and allowed to eat and her HA went away. No HA today and labs and exam are still normal. She had no severe range BPs but her her labetalol was increased from 100 bid to 200 bid and her BPs are under better control I d/w her that I want to keep her for today and repeat her labs tomorrow morning and if still doing well then can d/c her to home with very close follow up.   I already set her up for IOL on 3/2 @ 2345 and request sent to [facility] for 2/20 and 2/27 BP check and lab visit in addition to her already schedule 2/23 and 3/1 bpp and MD visits. I told her recommend to be out of work until delivery *Preterm: pt declined bmz. Okay to hold off *PPx: scds, oob ad lib *FEN/GI: regular  diet *Dispo: see above. Possibly tomorrow.   Durene Romans MD Attending [facility] [place] [facility] GYN Consult Phone: [phone] (M-F, 0800-1700) &  [phone]  (Off hours, weekends, holidays)

## 2021-10-08 ENCOUNTER — Encounter (HOSPITAL_COMMUNITY): Payer: Self-pay | Admitting: Family Medicine

## 2021-10-08 DIAGNOSIS — O1413 Severe pre-eclampsia, third trimester: Secondary | ICD-10-CM | POA: Diagnosis not present

## 2021-10-08 DIAGNOSIS — Z3A35 35 weeks gestation of pregnancy: Secondary | ICD-10-CM | POA: Diagnosis not present

## 2021-10-08 DIAGNOSIS — O10919 Unspecified pre-existing hypertension complicating pregnancy, unspecified trimester: Secondary | ICD-10-CM

## 2021-10-08 LAB — COMPREHENSIVE METABOLIC PANEL
ALT: 13 U/L (ref 0–44)
AST: 16 U/L (ref 15–41)
Albumin: 2.4 g/dL — ABNORMAL LOW (ref 3.5–5.0)
Alkaline Phosphatase: 95 U/L (ref 38–126)
Anion gap: 8 (ref 5–15)
BUN: 9 mg/dL (ref 6–20)
CO2: 19 mmol/L — ABNORMAL LOW (ref 22–32)
Calcium: 8.5 mg/dL — ABNORMAL LOW (ref 8.9–10.3)
Chloride: 108 mmol/L (ref 98–111)
Creatinine, Ser: 0.65 mg/dL (ref 0.44–1.00)
GFR, Estimated: >60 mL/min (ref >60)
Glucose, Bld: 81 mg/dL (ref 70–99)
Potassium: 4.1 mmol/L (ref 3.5–5.1)
Sodium: 135 mmol/L (ref 135–145)
Total Bilirubin: 0.4 mg/dL (ref 0.3–1.2)
Total Protein: 5.9 g/dL — ABNORMAL LOW (ref 6.5–8.1)

## 2021-10-08 LAB — CULTURE, BETA STREP (GROUP B ONLY)

## 2021-10-08 LAB — CBC
HCT: 31.3 % — ABNORMAL LOW (ref 36.0–46.0)
Hemoglobin: 10.3 g/dL — ABNORMAL LOW (ref 12.0–15.0)
MCH: 28.8 pg (ref 26.0–34.0)
MCHC: 32.9 g/dL (ref 30.0–36.0)
MCV: 87.4 fL (ref 80.0–100.0)
Platelets: 281 10*3/uL (ref 150–400)
RBC: 3.58 MIL/uL — ABNORMAL LOW (ref 3.87–5.11)
RDW: 13.2 % (ref 11.5–15.5)
WBC: 8.6 10*3/uL (ref 4.0–10.5)
nRBC: 0 % (ref 0.0–0.2)

## 2021-10-08 MED ORDER — ZOLPIDEM TARTRATE 5 MG PO TABS: 5.0000 mg | ORAL_TABLET | Freq: Every evening | ORAL | Status: DC | PRN

## 2021-10-08 MED ORDER — FLEET ENEMA 7-19 GM/118ML RE ENEM: 1.0000 | ENEMA | Freq: Every day | RECTAL | Status: DC | PRN

## 2021-10-08 MED ORDER — MISOPROSTOL 50MCG HALF TABLET
50.0000 ug | ORAL_TABLET | ORAL | Status: DC | PRN
  Administered 2021-10-08 (×2): 50 ug via BUCCAL
  Filled 2021-10-08 (×2): qty 1

## 2021-10-08 MED ORDER — LIDOCAINE HCL (PF) 1 % IJ SOLN: 30.0000 mL | INTRAMUSCULAR | Status: DC | PRN

## 2021-10-08 MED ORDER — MISOPROSTOL 25 MCG QUARTER TABLET
25.0000 ug | ORAL_TABLET | ORAL | Status: DC | PRN
  Administered 2021-10-08 – 2021-10-09 (×2): 25 ug via VAGINAL
  Filled 2021-10-08 (×2): qty 1

## 2021-10-08 MED ORDER — TERBUTALINE SULFATE 1 MG/ML IJ SOLN: 0.2500 mg | Freq: Once | INTRAMUSCULAR | Status: DC | PRN

## 2021-10-08 MED ORDER — SOD CITRATE-CITRIC ACID 500-334 MG/5ML PO SOLN: 30.0000 mL | ORAL | Status: DC | PRN

## 2021-10-08 MED ORDER — OXYTOCIN BOLUS FROM INFUSION
333.0000 mL | Freq: Once | INTRAVENOUS | Status: AC
  Administered 2021-10-09: 333 mL via INTRAVENOUS

## 2021-10-08 MED ORDER — ONDANSETRON HCL 4 MG/2ML IJ SOLN
4.0000 mg | Freq: Four times a day (QID) | INTRAMUSCULAR | Status: DC | PRN
Start: 2021-10-08 — End: 2021-10-11
  Administered 2021-10-08 – 2021-10-09 (×2): 4 mg via INTRAVENOUS
  Filled 2021-10-08 (×2): qty 2

## 2021-10-08 MED ORDER — FENTANYL CITRATE (PF) 100 MCG/2ML IJ SOLN: 50.0000 ug | INTRAMUSCULAR | Status: DC | PRN

## 2021-10-08 MED ORDER — LACTATED RINGERS IV SOLN: INTRAVENOUS | Status: AC

## 2021-10-08 MED ORDER — LABETALOL HCL 200 MG PO TABS
300.0000 mg | ORAL_TABLET | Freq: Two times a day (BID) | ORAL | Status: DC
Start: 2021-10-08 — End: 2021-10-09
  Administered 2021-10-08 – 2021-10-09 (×2): 300 mg via ORAL
  Filled 2021-10-08 (×2): qty 1

## 2021-10-08 MED ORDER — LACTATED RINGERS IV SOLN: 500.0000 mL | INTRAVENOUS | Status: DC | PRN

## 2021-10-08 MED ORDER — OXYCODONE-ACETAMINOPHEN 5-325 MG PO TABS: 1.0000 | ORAL_TABLET | ORAL | Status: DC | PRN

## 2021-10-08 MED ORDER — SODIUM CHLORIDE 0.9 % IV SOLN
5.0000 10*6.[IU] | Freq: Once | INTRAVENOUS | Status: AC
  Administered 2021-10-08: 5 10*6.[IU] via INTRAVENOUS
  Filled 2021-10-08: qty 5

## 2021-10-08 MED ORDER — OXYCODONE-ACETAMINOPHEN 5-325 MG PO TABS: 2.0000 | ORAL_TABLET | ORAL | Status: DC | PRN

## 2021-10-08 MED ORDER — OXYTOCIN-SODIUM CHLORIDE 30-0.9 UT/500ML-% IV SOLN
1.0000 m[IU]/min | INTRAVENOUS | Status: DC
  Filled 2021-10-08: qty 500

## 2021-10-08 MED ORDER — MISOPROSTOL 25 MCG QUARTER TABLET
25.0000 ug | ORAL_TABLET | ORAL | Status: DC | PRN
  Filled 2021-10-08: qty 1

## 2021-10-08 MED ORDER — MAGNESIUM SULFATE BOLUS VIA INFUSION
4.0000 g | Freq: Once | INTRAVENOUS | Status: AC
  Administered 2021-10-08: 4 g via INTRAVENOUS
  Filled 2021-10-08: qty 1000

## 2021-10-08 MED ORDER — HYDROXYZINE HCL 50 MG PO TABS: 50.0000 mg | ORAL_TABLET | Freq: Four times a day (QID) | ORAL | Status: DC | PRN

## 2021-10-08 MED ORDER — OXYTOCIN-SODIUM CHLORIDE 30-0.9 UT/500ML-% IV SOLN: 2.5000 [IU]/h | INTRAVENOUS | Status: DC

## 2021-10-08 MED ORDER — MAGNESIUM SULFATE 40 GM/1000ML IV SOLN
2.0000 g/h | INTRAVENOUS | Status: AC
Start: 2021-10-08 — End: 2021-10-10
  Administered 2021-10-09: 2 g/h via INTRAVENOUS
  Filled 2021-10-08: qty 2000
  Filled 2021-10-08 (×2): qty 1000

## 2021-10-08 MED ORDER — ACETAMINOPHEN 325 MG PO TABS
650.0000 mg | ORAL_TABLET | ORAL | Status: DC | PRN
  Administered 2021-10-09 (×2): 650 mg via ORAL
  Filled 2021-10-08 (×2): qty 2

## 2021-10-08 MED ORDER — PENICILLIN G POT IN DEXTROSE 60000 UNIT/ML IV SOLN
3.0000 10*6.[IU] | INTRAVENOUS | Status: DC
  Administered 2021-10-08 – 2021-10-09 (×5): 3 10*6.[IU] via INTRAVENOUS
  Filled 2021-10-08 (×6): qty 50

## 2021-10-08 NOTE — Progress Notes (Signed)
Brittney Cummings is a 26 y.o. G3P2002 at [redacted]w[redacted]d by LMP admitted for induction of labor due to severe preE with SF, treated with BP medication, poorly controlled.  Subjective: Pt denies HA, vision changes, RUQ at this time.  Previous headache resolved from earlier today. She has not questions or concerns about her treatment plan. Reports mild cramping feels like "period cramps" no bleeding.  She is knowledgable about foley bulb and refuses.  Her mom and her sister had "bad experienced, mom baby flipped to breech and sister severe pain and never dilated." She is not interested in foley bulb and declined, reports she will do anything but foley bulb.   Objective: Pt resting in bed.  NAD answering questions appropriately.Husband at bedside.  BP 137/90    Pulse 80    Temp 98.5 F (36.9 C) (Oral)    Resp 18    Ht 4\' 11"  (1.499 m)    Wt 80 kg    LMP 02/04/2021 (Exact Date)    SpO2 99%    BMI 35.63 kg/m  I/O last 3 completed shifts: In: 2040 [P.O.:2040] Out: 3675 [Urine:3675] Total I/O In: 850 [P.O.:150; I.V.:450; IV Piggyback:250] Out: 400 [Urine:400]  FHT:  FHR: 120's bpm, variability: minimal ,  accelerations:  Present,  decelerations:  Absent UC:   irregular, toco adjusted SVE:   Cervix, softer, but posterior. Dilation: Fingertip Effacement (%): 50 Station: -3 Exam by:: [employer]: Lab Results  Component Value Date   WBC 8.6 10/08/2021   HGB 10.3 (L) 10/08/2021   HCT 31.3 (L) 10/08/2021   MCV 87.4 10/08/2021   PLT 281 10/08/2021    Assessment / Plan: Labor:  IOL, s/p Cytotec x 1 dose.   SVE unchanged.  Will repeat buccal cytotec dose at this time. Discussed R/B/A of foley bulb.  Preeclampsia:  on magnesium sulfate, labs stable, and severe range BP's (160's/110's)-s/p IV labetalol x 2 doses. BP now at 130's/90's. Continue PO Labetalol as ordered. Pt asymptomatic.     Fetal Wellbeing:  Category I Pain Control:   No pain at this time I/D:   GBS positive: continue PCN per  protocol. Anticipated MOD:  NSVD  Tashena Ibach 10/08/2021, 7:00 PM

## 2021-10-08 NOTE — Progress Notes (Signed)
Daily Antepartum Note  Admission Date: 10/06/2021 Current Date: 10/08/2021 10:13 AM  Brittney Cummings is a 26 y.o. [id number] @ [redacted]w[redacted]d, HD#3, admitted for concern about severe pre-eclampsia (BP).  Pregnancy complicated by: Patient Active Problem List   Diagnosis Date Noted   Severe pre-eclampsia 10/06/2021   Maternal obesity, antepartum 04/21/2021   Chronic hypertension affecting pregnancy 04/13/2021   Supervision of high risk pregnancy, antepartum 03/24/2021   Lumbar radiculopathy, right 11/26/2020   Chronic headaches 07/25/2019    Overnight/24hr events:  Patient had Labetalol increased to 200 mg bid but had BP 161/95 (2/17 0738) and 159/100 (2/17 2001).  BPs mostly in 150s.  Patient Vitals for the past 24 hrs:  BP Temp Temp src Pulse Resp SpO2  10/08/21 0737 (!) 151/88 98.3 F (36.8 C) -- 74 -- 100 %  10/08/21 0358 128/82 98.3 F (36.8 C) -- 82 16 98 %  10/07/21 2332 (!) 151/86 98.3 F (36.8 C) Oral 85 16 99 %  10/07/21 2128 (!) 156/98 -- -- 66 18 --  10/07/21 2001 (!) 159/100 98.5 F (36.9 C) Oral 74 16 98 %  10/07/21 1337 (!) 154/89 98.3 F (36.8 C) Oral 80 16 100 %    Subjective:  Patient denies any headaches, visual symptoms, RUQ/epigastric pain or other concerning symptoms. No contractions, no LOF, no VB. Good FM   Objective:   FHR Tracing: 130 baseline, +accels, no decel, mod variability Tocometry: None  Physical exam: General: Well nourished, well developed female in no acute distress. Abdomen: gravid nttp Cardiovascular: S1, S2 normal, no murmur, rub or gallop, regular rate and rhythm Respiratory: CTAB Extremities: no clubbing, cyanosis or edema, 2+ DTRs, no clonus Skin: Warm and dry.   Medications: Current Facility-Administered Medications  Medication Dose Route Frequency Provider Last Rate Last Admin   acetaminophen (TYLENOL) tablet 650 mg  650 mg Oral Q4H PRN Genia Del, MD       calcium carbonate (TUMS - dosed in mg elemental calcium)  chewable tablet 400 mg of elemental calcium  2 tablet Oral Q4H PRN Genia Del, MD       docusate sodium (COLACE) capsule 100 mg  100 mg Oral BID PRN Aletha Halim, MD       labetalol (NORMODYNE) injection 20 mg  20 mg Intravenous PRN Genia Del, MD       And   labetalol (NORMODYNE) injection 40 mg  40 mg Intravenous PRN Genia Del, MD       And   labetalol (NORMODYNE) injection 80 mg  80 mg Intravenous PRN Genia Del, MD       And   hydrALAZINE (APRESOLINE) injection 10 mg  10 mg Intravenous PRN Genia Del, MD       labetalol (NORMODYNE) tablet 200 mg  200 mg Oral BID Genia Del, MD   200 mg at 10/08/21 [id number]   prenatal multivitamin tablet 1 tablet  1 tablet Oral Q1200 Genia Del, MD   1 tablet at 10/08/21 [id number]    Labs:  Recent Labs  Lab 10/06/21 1040 10/07/21 0447 10/08/21 0423  WBC 7.6 7.4 8.6  HGB 10.3* 10.3* 10.3*  HCT 30.8* 31.4* 31.3*  PLT 297 297 281     Recent Labs  Lab 10/06/21 1040 10/07/21 0447 10/08/21 0423  NA 136 135 135  K 3.6 4.1 4.1  CL 110 109 108  CO2 18* 19* 19*  BUN 8 8 9   CREATININE 0.59 0.66 0.65  CALCIUM 8.5*  8.1* 8.5*  PROT 6.3* 6.0* 5.9*  BILITOT 0.1* 0.1* 0.4  ALKPHOS 102 87 95  ALT 15 13 13   AST 19 17 16   GLUCOSE 102* 85 81    PC ratio 190 on 2/17 (230 2/16)  Radiology:  [id number]: cephalic, 8/8, afi 8.9 [id number]: 16%, 123456, ac 123XX123, cephalic, 8/8, afi 14  Assessment & Plan:  Patient meets criteria for superimposed severe preeclampsia on CHTN based on BP criteria at [redacted]w[redacted]d, despite increase in BP medications.  Discussed this diagnosis, delivery indicated.  Patient agreed with this plan.  Discussed details about induction of labor and what to expect.   L&D RN in charge notified, orders placed.  L&D team notified.  Category 1 FHR tracing.   To L&D when ready.    Verita Schneiders, MD, [place] for [employer], [place]

## 2021-10-08 NOTE — Progress Notes (Signed)
Brittney Cummings is a 26 y.o. G3P2002 at [redacted]w[redacted]d by LMP admitted for induction of labor due to severe preE with SF.  Subjective: Reports she is doing well, no concerns.  No HA, BV, or RUQ pain.    Objective: BP 137/90    Pulse 80    Temp 98.5 F (36.9 C) (Oral)    Resp 16    Ht 4\' 11"  (1.499 m)    Wt 80 kg    LMP 02/04/2021 (Exact Date)    SpO2 99%    BMI 35.63 kg/m  I/O last 3 completed shifts: In: 2460 [P.O.:1710; I.V.:500; IV Piggyback:250] Out: 2730 [Urine:2730] No intake/output data recorded.  Pt resting in bed, NAD Head: facial edema noted Chest/Lung: CTAB Heart: RRR w/o M/G/R Legs: No lower leg edema, redness or tenderness DTR's: 2+, bilaterally Clonus: 2+, bilaterally  FHT:  FHR: 140's bpm, variability: moderate,  accelerations:  Present,  decelerations:  Absent UC:   none SVE:   Dilation: Fingertip Effacement (%): 50 Station: -3 Exam by:: [employer]  Labs: Lab Results  Component Value Date   WBC 8.6 10/08/2021   HGB 10.3 (L) 10/08/2021   HCT 31.3 (L) 10/08/2021   MCV 87.4 10/08/2021   PLT 281 10/08/2021    Assessment / Plan: Labor:  IOL: discussed R/B of cytotec. Cytotec x 1 doses now and repeat in 4 hours.  Preeclampsia:  on magnesium sulfate, no signs or symptoms of toxicity, labs stable, and BP elevated.  IV labetalol per protocol started.   Fetal Wellbeing:  Category I Pain Control:   No pain at this time. Plans for epidural later.  I/D:   GBS positive-PCN per protocol Anticipated MOD:  NSVD  Baraka Klatt 10/08/2021, 7:30 PM

## 2021-10-08 NOTE — Progress Notes (Signed)
Patient ID: Brittney Cummings, female   DOB: 28-Jul-1996, 26 y.o.   MRN: [id number]    Brittney Cummings is a 26 y.o. G3P2002 at [redacted]w[redacted]d  admitted for induction of labor due to pre-e with sf (BP and HA). She is status post cytotec x 2; next due at 11 pm. She is on MagSO4 2 gram/hour.   Subjective: -doing well; declines FB. She denies any HA, blurry vision, floating spots, RUQ pain.   Objective: Vitals:   10/08/21 1900 10/08/21 2005 10/08/21 2055 10/08/21 2100  BP:  (!) 156/106 (!) 146/85   Pulse:  78 85   Resp: 16 16  16   Temp:      TempSrc:      SpO2:      Weight:      Height:       Total I/O In: 559.4 [P.O.:170; I.V.:339.4; IV Piggyback:50] Out: 600 [Urine:600]  FHT:  FHR: 140 bpm, variability: moderate,  accelerations:  Present,  decelerations:  Absent UC:   none SVE:   Dilation: Fingertip Effacement (%): 50 Station: -3 Exam by:: [employer] [facility] Pitocin @ 0 mu/min  Labs: Lab Results  Component Value Date   WBC 8.6 10/08/2021   HGB 10.3 (L) 10/08/2021   HCT 31.3 (L) 10/08/2021   MCV 87.4 10/08/2021   PLT 281 10/08/2021    Assessment / Plan: Doing well; she denies any signs of pre-e.  Long discussion with patient about FB; patient declines. Reviewed that we will continue cytotec until safe to start pitocin, she is asking if her water can be broken. I recommended that we avoid that at this time due to her station and early labor, but that it can be revisited tomorrow. SHe asks if AROM leads to emergency c/section; I explained it did not unless there was a cord prolapse or some other complication. Patient is reassured.  Labor: Early labor; plan for next cytotec at 32 and continue BID labetalol.  Fetal Wellbeing:  Category I Pain Control:  Labor support without medications Anticipated MOD:  NSVD  Starr Lake 10/08/2021, 9:29 PM

## 2021-10-08 NOTE — Consult Note (Signed)
Neonatology Consult to Antenatal Patient:  I was asked by Dr. Macon Large to see this patient in order to provide antenatal counseling due to prematurity.  [name] was admitted 10/06/21 at 34.[redacted] weeks GA with [facility] and severe super-imposed pre-eclampsia. She is currently not having active labor and membranes are intact. She has received Labetalol and IV Mag.  BTMZ was electively decided to not be given.  Also receiving prophylactic PCN for +GBS screen.  No chorio concerns; baby BPP has been good 8/8 with good fetal movement reported by mother.    I spoke with the patient and husband. We discussed likely delivery in the next 1-2 days, including usual DR management (NICU will be called if clinically warranted), and possible complications due to late pre-term status such as respiratory issues and the need for feeding and temperature support, which may include IV access, gavage feeding support, as well as LOS, Mortality and Morbidity, and long term outcomes. They did have some routine questions at this time which I answered.   Thank you for asking me to see this patient.  Dineen Kid Leary Roca, MD Neonatologist  The total length of face-to-face or floor/unit time for this encounter was 25 minutes. Counseling and/or coordination of care was 40 minutes of the above.

## 2021-10-09 ENCOUNTER — Encounter (HOSPITAL_COMMUNITY): Payer: Self-pay | Admitting: Family Medicine

## 2021-10-09 ENCOUNTER — Inpatient Hospital Stay (HOSPITAL_COMMUNITY): Payer: No Typology Code available for payment source | Admitting: Anesthesiology

## 2021-10-09 DIAGNOSIS — O1414 Severe pre-eclampsia complicating childbirth: Secondary | ICD-10-CM

## 2021-10-09 DIAGNOSIS — O99824 Streptococcus B carrier state complicating childbirth: Secondary | ICD-10-CM

## 2021-10-09 DIAGNOSIS — B951 Streptococcus, group B, as the cause of diseases classified elsewhere: Secondary | ICD-10-CM | POA: Insufficient documentation

## 2021-10-09 DIAGNOSIS — Z3A35 35 weeks gestation of pregnancy: Secondary | ICD-10-CM

## 2021-10-09 LAB — CBC
HCT: 33.2 % — ABNORMAL LOW (ref 36.0–46.0)
HCT: 33 % — ABNORMAL LOW (ref 36.0–46.0)
Hemoglobin: 11.1 g/dL — ABNORMAL LOW (ref 12.0–15.0)
Hemoglobin: 10.8 g/dL — ABNORMAL LOW (ref 12.0–15.0)
MCH: 28.8 pg (ref 26.0–34.0)
MCH: 28.5 pg (ref 26.0–34.0)
MCHC: 33.4 g/dL (ref 30.0–36.0)
MCHC: 32.7 g/dL (ref 30.0–36.0)
MCV: 86.2 fL (ref 80.0–100.0)
MCV: 87.1 fL (ref 80.0–100.0)
Platelets: 299 10*3/uL (ref 150–400)
Platelets: 303 10*3/uL (ref 150–400)
RBC: 3.85 MIL/uL — ABNORMAL LOW (ref 3.87–5.11)
RBC: 3.79 MIL/uL — ABNORMAL LOW (ref 3.87–5.11)
RDW: 13.2 % (ref 11.5–15.5)
RDW: 13.2 % (ref 11.5–15.5)
WBC: 10.4 10*3/uL (ref 4.0–10.5)
WBC: 17 10*3/uL — ABNORMAL HIGH (ref 4.0–10.5)
nRBC: 0 % (ref 0.0–0.2)
nRBC: 0 % (ref 0.0–0.2)

## 2021-10-09 LAB — COMPREHENSIVE METABOLIC PANEL
ALT: 14 U/L (ref 0–44)
AST: 18 U/L (ref 15–41)
Albumin: 2.5 g/dL — ABNORMAL LOW (ref 3.5–5.0)
Alkaline Phosphatase: 103 U/L (ref 38–126)
Anion gap: 9 (ref 5–15)
BUN: 5 mg/dL — ABNORMAL LOW (ref 6–20)
CO2: 20 mmol/L — ABNORMAL LOW (ref 22–32)
Calcium: 7.1 mg/dL — ABNORMAL LOW (ref 8.9–10.3)
Chloride: 103 mmol/L (ref 98–111)
Creatinine, Ser: 0.6 mg/dL (ref 0.44–1.00)
GFR, Estimated: >60 mL/min (ref >60)
Glucose, Bld: 93 mg/dL (ref 70–99)
Potassium: 4.2 mmol/L (ref 3.5–5.1)
Sodium: 132 mmol/L — ABNORMAL LOW (ref 135–145)
Total Bilirubin: 0.2 mg/dL — ABNORMAL LOW (ref 0.3–1.2)
Total Protein: 6.5 g/dL (ref 6.5–8.1)

## 2021-10-09 LAB — MAGNESIUM: Magnesium: 6.1 mg/dL (ref 1.7–2.4)

## 2021-10-09 MED ORDER — PHENYLEPHRINE 40 MCG/ML (10ML) SYRINGE FOR IV PUSH (FOR BLOOD PRESSURE SUPPORT): 80.0000 ug | PREFILLED_SYRINGE | INTRAVENOUS | Status: DC | PRN

## 2021-10-09 MED ORDER — FENTANYL-BUPIVACAINE-NACL 0.5-0.125-0.9 MG/250ML-% EP SOLN
12.0000 mL/h | EPIDURAL | Status: DC | PRN
  Administered 2021-10-09: 10 mL/h via EPIDURAL
  Filled 2021-10-09: qty 250

## 2021-10-09 MED ORDER — OXYTOCIN-SODIUM CHLORIDE 30-0.9 UT/500ML-% IV SOLN
1.0000 m[IU]/min | INTRAVENOUS | Status: DC
  Administered 2021-10-09: 2 m[IU]/min via INTRAVENOUS

## 2021-10-09 MED ORDER — LIDOCAINE HCL (PF) 1 % IJ SOLN
INTRAMUSCULAR | Status: DC | PRN
Start: 2021-10-09 — End: 2021-10-09
  Administered 2021-10-09: 6 mL via EPIDURAL

## 2021-10-09 MED ORDER — EPHEDRINE 5 MG/ML INJ: 10.0000 mg | INTRAVENOUS | Status: DC | PRN

## 2021-10-09 MED ORDER — BENZOCAINE-MENTHOL 20-0.5 % EX AERO: 1.0000 "application " | INHALATION_SPRAY | CUTANEOUS | Status: DC | PRN

## 2021-10-09 MED ORDER — SENNOSIDES-DOCUSATE SODIUM 8.6-50 MG PO TABS
2.0000 | ORAL_TABLET | ORAL | Status: DC
  Administered 2021-10-10 – 2021-10-11 (×2): 2 via ORAL
  Filled 2021-10-09 (×2): qty 2

## 2021-10-09 MED ORDER — WITCH HAZEL-GLYCERIN EX PADS: 1.0000 "application " | MEDICATED_PAD | CUTANEOUS | Status: DC | PRN

## 2021-10-09 MED ORDER — ONDANSETRON HCL 4 MG PO TABS: 4.0000 mg | ORAL_TABLET | ORAL | Status: DC | PRN

## 2021-10-09 MED ORDER — PHENYLEPHRINE 40 MCG/ML (10ML) SYRINGE FOR IV PUSH (FOR BLOOD PRESSURE SUPPORT)
80.0000 ug | PREFILLED_SYRINGE | INTRAVENOUS | Status: DC | PRN
  Filled 2021-10-09: qty 10

## 2021-10-09 MED ORDER — OXYCODONE HCL 5 MG PO TABS: 5.0000 mg | ORAL_TABLET | ORAL | Status: DC | PRN

## 2021-10-09 MED ORDER — COCONUT OIL OIL: 1.0000 "application " | TOPICAL_OIL | Status: DC | PRN

## 2021-10-09 MED ORDER — PRENATAL MULTIVITAMIN CH
1.0000 | ORAL_TABLET | Freq: Every day | ORAL | Status: DC
  Administered 2021-10-10 – 2021-10-11 (×2): 1 via ORAL
  Filled 2021-10-09 (×2): qty 1

## 2021-10-09 MED ORDER — IBUPROFEN 600 MG PO TABS
600.0000 mg | ORAL_TABLET | Freq: Four times a day (QID) | ORAL | Status: DC
  Administered 2021-10-09 – 2021-10-11 (×7): 600 mg via ORAL
  Filled 2021-10-09 (×8): qty 1

## 2021-10-09 MED ORDER — SIMETHICONE 80 MG PO CHEW: 80.0000 mg | CHEWABLE_TABLET | ORAL | Status: DC | PRN

## 2021-10-09 MED ORDER — LABETALOL HCL 200 MG PO TABS
200.0000 mg | ORAL_TABLET | Freq: Two times a day (BID) | ORAL | Status: DC
Start: 2021-10-09 — End: 2021-10-11
  Administered 2021-10-09 – 2021-10-11 (×4): 200 mg via ORAL
  Filled 2021-10-09 (×4): qty 1

## 2021-10-09 MED ORDER — ZOLPIDEM TARTRATE 5 MG PO TABS: 5.0000 mg | ORAL_TABLET | Freq: Every evening | ORAL | Status: DC | PRN

## 2021-10-09 MED ORDER — ACETAMINOPHEN 325 MG PO TABS: 650.0000 mg | ORAL_TABLET | ORAL | Status: DC | PRN

## 2021-10-09 MED ORDER — DIPHENHYDRAMINE HCL 50 MG/ML IJ SOLN: 12.5000 mg | INTRAMUSCULAR | Status: DC | PRN

## 2021-10-09 MED ORDER — ONDANSETRON HCL 4 MG/2ML IJ SOLN: 4.0000 mg | INTRAMUSCULAR | Status: DC | PRN

## 2021-10-09 MED ORDER — OXYCODONE HCL 5 MG PO TABS: 10.0000 mg | ORAL_TABLET | ORAL | Status: DC | PRN

## 2021-10-09 MED ORDER — DIBUCAINE (PERIANAL) 1 % EX OINT: 1.0000 "application " | TOPICAL_OINTMENT | CUTANEOUS | Status: DC | PRN

## 2021-10-09 MED ORDER — LACTATED RINGERS IV SOLN: 500.0000 mL | Freq: Once | INTRAVENOUS | Status: DC

## 2021-10-09 MED ORDER — TETANUS-DIPHTH-ACELL PERTUSSIS 5-2.5-18.5 LF-MCG/0.5 IM SUSY: 0.5000 mL | PREFILLED_SYRINGE | Freq: Once | INTRAMUSCULAR | Status: DC

## 2021-10-09 MED ORDER — TERBUTALINE SULFATE 1 MG/ML IJ SOLN: 0.2500 mg | Freq: Once | INTRAMUSCULAR | Status: DC | PRN

## 2021-10-09 MED ORDER — DIPHENHYDRAMINE HCL 25 MG PO CAPS: 25.0000 mg | ORAL_CAPSULE | Freq: Four times a day (QID) | ORAL | Status: DC | PRN

## 2021-10-09 NOTE — Progress Notes (Signed)
Brittney Cummings is a 26 y.o. G3P2002 at [redacted]w[redacted]d admitted for induction of labor due to Pre-eclamptic toxemia of pregnancy..  Subjective:  No questions at this time. Getting more uncomfortable with contractions and requesting an epidural.   Objective: BP 133/90    Pulse 80    Temp 97.9 F (36.6 C) (Oral)    Resp 18    Ht 4\' 11"  (1.499 m)    Wt 80 kg    LMP 02/04/2021 (Exact Date)    SpO2 99%    BMI 35.63 kg/m  I/O last 3 completed shifts: In: 5087.6 [P.O.:2905; I.V.:1782.6; IV Piggyback:400] Out: [id number] [Urine:5380] Total I/O In: -  Out: 200 [Urine:200]  FHT:  FHR: 120 bpm, variability: moderate,  accelerations:  Present,  decelerations:  Absent UC:   not tracing, but approx every 2-5 mins by palpation  SVE:   2/60/-2/Vertex   Labs: Lab Results  Component Value Date   WBC 10.4 10/09/2021   HGB 11.1 (L) 10/09/2021   HCT 33.2 (L) 10/09/2021   MCV 86.2 10/09/2021   PLT 299 10/09/2021    Assessment / Plan: IOL 2/2 pre-eclampsia with severe features  Has had oh total of 4 doses of cytotec at this time  Declines FB, so will start pitocin at this time  Labor:  Will start pitocin and continue to monitor  Preeclampsia:  on magnesium sulfate, labs stable, and patient has a headache at this time. Will give tylenol to see if this helps.  Fetal Wellbeing:  Category I Pain Control:  Labor support without medications and planning epidural  I/D:  n/a Anticipated MOD:  NSVD  [name] DNP, CNM  10/09/21  9:06 AM

## 2021-10-09 NOTE — Anesthesia Procedure Notes (Signed)
Epidural Patient location during procedure: OB Start time: 10/09/2021 9:55 AM End time: 10/09/2021 10:10 AM  Staffing Anesthesiologist: Mellody Dance, MD Performed: anesthesiologist   Preanesthetic Checklist Completed: patient identified, IV checked, site marked, risks and benefits discussed, monitors and equipment checked, pre-op evaluation and timeout performed  Epidural Patient position: sitting Prep: DuraPrep Patient monitoring: heart rate, cardiac monitor, continuous pulse ox and blood pressure Approach: midline Location: L2-L3 Injection technique: LOR saline  Needle:  Needle type: Tuohy  Needle gauge: 17 G Needle length: 9 cm Needle insertion depth: 6 cm Catheter type: closed end flexible Catheter size: 20 Guage Catheter at skin depth: 11 cm Test dose: negative and Other  Assessment Events: blood not aspirated, injection not painful, no injection resistance and negative IV test  Additional Notes Informed consent obtained prior to proceeding including risk of failure, 1% risk of PDPH, risk of minor discomfort and bruising.  Discussed rare but serious complications including epidural abscess, permanent nerve injury, epidural hematoma.  Discussed alternatives to epidural analgesia and patient desires to proceed.  Timeout performed pre-procedure verifying patient name, procedure, and platelet count.  Patient tolerated procedure well.

## 2021-10-09 NOTE — Lactation Note (Signed)
This note was copied from a baby's chart. Lactation Consultation Note  Patient Name: Brittney Cummings [id number] Date: 10/09/2021 Reason for consult: L&D Initial assessment;NICU baby;Late-preterm 34-36.6wks;Infant < 6lbs;Other (Comment) ([employer] employee) Age:27 hours  Visited with mom of 1 hours old LPI NICU female, she's a P3 and reported (+) breast changes during the pregnancy. Assisted mom with breast massage and hand expression (colostrum easily expressed), she was getting ready to be transferred to her room on the 1st floor.  Reviewed pumping schedule, pumping log, lactogenesis II and expectations. 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  Maternal Data Has patient been taught Hand Expression?: Yes Does the patient have breastfeeding experience prior to this delivery?: Yes How long did the patient breastfeed?: 1st baby for 1.5 months and 2nd one for a year  Feeding Mother's Current Feeding Choice: Breast Milk  Interventions Interventions: Breast feeding basics reviewed;Hand express;Breast massage;Education, NICU booklet, BF brochure  Plan of care Encourage mom to start pumping every 3 hours as soon as she's able to; ideally 8 pumping sessions/24 hours OB Specialty care RN will set mom up with a DEBP  GOB (maternal) present and supportive, family is bilingual, they speak both, [place] and [place]. All questions and concerns answered, family to call NICU LC PRN.  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

## 2021-10-09 NOTE — Anesthesia Preprocedure Evaluation (Signed)
Anesthesia Evaluation  Patient identified by MRN, date of birth, ID band Patient awake    Reviewed: Allergy & Precautions, NPO status , Patient's Chart, lab work & pertinent test results  Airway Mallampati: II  TM Distance: >3 FB Neck ROM: Full    Dental no notable dental hx.    Pulmonary neg pulmonary ROS,    Pulmonary exam normal breath sounds clear to auscultation       Cardiovascular hypertension (preeclampsia), Pt. on medications and Pt. on home beta blockers negative cardio ROS Normal cardiovascular exam Rhythm:Regular Rate:Normal     Neuro/Psych  Headaches,  Neuromuscular disease (lumbar radiculopathy, right) negative psych ROS   GI/Hepatic negative GI ROS, Neg liver ROS,   Endo/Other  negative endocrine ROS  Renal/GU negative Renal ROS  negative genitourinary   Musculoskeletal negative musculoskeletal ROS (+)   Abdominal   Peds negative pediatric ROS (+)  Hematology negative hematology ROS (+)   Anesthesia Other Findings   Reproductive/Obstetrics negative OB ROS                             Anesthesia Physical Anesthesia Plan  ASA: 3  Anesthesia Plan: Epidural   Post-op Pain Management:    Induction:   PONV Risk Score and Plan: 2 and Treatment may vary due to age or medical condition  Airway Management Planned: Natural Airway  Additional Equipment: None  Intra-op Plan:   Post-operative Plan:   Informed Consent: I have reviewed the patients History and Physical, chart, labs and discussed the procedure including the risks, benefits and alternatives for the proposed anesthesia with the patient or authorized representative who has indicated his/her understanding and acceptance.       Plan Discussed with: Anesthesiologist  Anesthesia Plan Comments:         Anesthesia Quick Evaluation

## 2021-10-09 NOTE — Progress Notes (Signed)
Patient ID: Brittney Cummings, female   DOB: 02/17/1996, 26 y.o.   MRN: [id number]   Brittney Cummings is a 26 y.o. G3P2002 at [redacted]w[redacted]d  admitted for pre-e with severe features (BP and HA)  Subjective: Patient resting in bed, no complaints.   Objective: Vitals:   10/09/21 0200 10/09/21 0255 10/09/21 0400 10/09/21 0500  BP: 122/82 123/84 (!) 157/98 (!) 162/108  Pulse: 86 82 78 78  Resp: 16 16 16 16   Temp:      TempSrc:      SpO2:      Weight:      Height:       Total I/O In: 2432.3 [P.O.:1195; I.V.:1087.3; IV Piggyback:150] Out: 2850 [Urine:2850]  FHT:  FHR: 120 bpm, variability: moderate,  accelerations:  Present,  decelerations:  Absent UC:   irregular SVE:   Dilation: 1 Effacement (%): 50 Station: -3 Exam by:: Cletus Gash, RN Pitocin @ 0 mu/min  Labs: Lab Results  Component Value Date   WBC 8.6 10/08/2021   HGB 10.3 (L) 10/08/2021   HCT 31.3 (L) 10/08/2021   MCV 87.4 10/08/2021   PLT 281 10/08/2021    Assessment / Plan: Early labor, s/p 4 doses of cytotec  Notified by RN of two severe range blood pressures; will start HTN protocol Labor:  eraly labor Fetal Wellbeing:  Category I Pain Control:  Labor support without medications Anticipated MOD:  NSVD  Mervyn Skeeters Anish Vana 10/09/2021, 5:18 AM

## 2021-10-09 NOTE — Discharge Summary (Signed)
Postpartum Discharge Summary      Patient Name: Brittney Cummings DOB: July 27, 1996 MRN: [id number]  Date of admission: 10/06/2021 Delivery date:10/11/21 Delivering provider: [name] D  Date of discharge: 10/11/2021  Admitting diagnosis: Severe pre-eclampsia [O14.10] Intrauterine pregnancy: [redacted]w[redacted]d    Secondary diagnosis:  Principal Problem:   Severe pre-eclampsia Active Problems:   Supervision of high risk pregnancy, antepartum   Chronic hypertension with superimposed preeclampsia   Maternal obesity, antepartum  Additional problems:     Discharge diagnosis: Preterm Pregnancy Delivered and CHTN with superimposed preeclampsia                                              Post partum procedures: magnesium Augmentation: AROM, Pitocin, and Cytotec Complications: None  Hospital course: Induction of Labor With Vaginal Delivery   26y.o. yo [id number] admitted to the hospital 10/06/2021 for induction of labor.  Indication for induction: Preeclampsia.  Patient had an uncomplicated labor course as follows: Membrane Rupture Time/Date: 12:17 PM ,10/09/2021   Delivery Method:Vaginal, Spontaneous  Episiotomy: None  Lacerations:  None  Details of delivery can be found in separate delivery note.  Patient had a routine postpartum course. Patient is discharged home 10/11/21.  Newborn Data: Birth date:10/09/2021  Birth time:12:54 PM  Gender:Female  Living status:Living  Apgars:6 ,9  Weight:1960 g   Magnesium Sulfate received: Yes: Seizure prophylaxis BMZ received: No Rhophylac:N/A MMR:N/A T-DaP:Given postpartum Flu: No Transfusion:No  Physical exam  Vitals:   10/10/21 1955 10/10/21 2322 10/11/21 0415 10/11/21 0733  BP: (!) 145/93 113/76 127/73 (!) 149/84  Pulse: 80 78 73 68  Resp: '18 18 18 18  ' Temp: 98.7 F (37.1 C) 98.4 F (36.9 C) 98.6 F (37 C) 98.7 F (37.1 C)  TempSrc: Oral Oral Oral Oral  SpO2: 100% 100% 98% 100%  Weight:      Height:        General: alert, cooperative, and no distress Lochia: appropriate Uterine Fundus: firm Incision: N/A DVT Evaluation: No evidence of DVT seen on physical exam. Labs: Lab Results  Component Value Date   WBC 11.5 (H) 10/10/2021   HGB 10.3 (L) 10/10/2021   HCT 31.2 (L) 10/10/2021   MCV 88.1 10/10/2021   PLT 274 10/10/2021   CMP Latest Ref Rng & Units 10/10/2021  Glucose 70 - 99 mg/dL 97  BUN 6 - 20 mg/dL 8  Creatinine 0.44 - 1.00 mg/dL 0.70  Sodium 135 - 145 mmol/L 130(L)  Potassium 3.5 - 5.1 mmol/L 4.3  Chloride 98 - 111 mmol/L 100  CO2 22 - 32 mmol/L 22  Calcium 8.9 - 10.3 mg/dL 6.2(LL)  Total Protein 6.5 - 8.1 g/dL 5.9(L)  Total Bilirubin 0.3 - 1.2 mg/dL 0.2(L)  Alkaline Phos 38 - 126 U/L 93  AST 15 - 41 U/L 21  ALT 0 - 44 U/L 17   Edinburgh Score: Edinburgh Postnatal Depression Scale Screening Tool 10/10/2021  I have been able to laugh and see the funny side of things. 0  I have looked forward with enjoyment to things. 0  I have blamed myself unnecessarily when things went wrong. 0  I have been anxious or worried for no good reason. 0  I have felt scared or panicky for no good reason. 0  Things have been getting on top of me. 0  I have been  so unhappy that I have had difficulty sleeping. 0  I have felt sad or miserable. 0  I have been so unhappy that I have been crying. 0  The thought of harming myself has occurred to me. 0  Edinburgh Postnatal Depression Scale Total 0     After visit meds:     Discharge home in stable condition Infant Feeding: Breast Infant Disposition: Discharge instruction: per After Visit Summary and Postpartum booklet. Activity: Advance as tolerated. Pelvic rest for 6 weeks.  Diet: routine diet Future Appointments: Future Appointments  Date Time Provider Loop  10/19/2021 10:30 AM [facility] NURSE [facility]   Follow up Visit:  [place] Follow up on 10/19/2021.   Why:  already scheduled BP check Contact information: [place] [id number]                 Please schedule this patient for a In person postpartum visit in 6 weeks with the following provider: Any provider. Additional Postpartum F/U:BP check 2-3 days  High risk pregnancy complicated by: HTN Delivery mode:  Vaginal, Spontaneous  Anticipated Birth Control:  POPs   10/11/2021 Florian Buff, MD

## 2021-10-09 NOTE — Progress Notes (Signed)
Brittney Cummings is a 26 y.o. G3P2002 at [redacted]w[redacted]d admitted for induction of labor due to Pre-eclamptic toxemia of pregnancy..  Subjective:  Has an epidural now. Feeling better, but feeling some pressure with contractions.   Objective: BP 125/80    Pulse 74    Temp 98 F (36.7 C) (Oral)    Resp 18    Ht 4\' 11"  (1.499 m)    Wt 80 kg    LMP 02/04/2021 (Exact Date)    SpO2 99%    BMI 35.63 kg/m  I/O last 3 completed shifts: In: 5087.6 [P.O.:2905; I.V.:1782.6; IV Piggyback:400] Out: [id number] [Urine:5380] Total I/O In: 798.8 [P.O.:240; I.V.:358.8; IV Piggyback:200] Out: 390 [Urine:390]  FHT:  FHR: 125 bpm, variability: moderate,  accelerations:  Present,  decelerations:  Absent UC:   not tracing with toco, AROM and IUPC placed. Contractions about every 2 mins  SVE:   4/100/0/vtx/AROM   Labs: Lab Results  Component Value Date   WBC 10.4 10/09/2021   HGB 11.1 (L) 10/09/2021   HCT 33.2 (L) 10/09/2021   MCV 86.2 10/09/2021   PLT 299 10/09/2021    Assessment / Plan: Induction of labor due to preeclampsia,  progressing well on pitocin  Labor: Progressing normally Preeclampsia:  labs stable Fetal Wellbeing:  Category I Pain Control:  Epidural I/D:  n/a Anticipated MOD:  NSVD  [name] DNP, CNM  10/09/21  12:19 PM

## 2021-10-10 LAB — CBC
HCT: 31.2 % — ABNORMAL LOW (ref 36.0–46.0)
Hemoglobin: 10.3 g/dL — ABNORMAL LOW (ref 12.0–15.0)
MCH: 29.1 pg (ref 26.0–34.0)
MCHC: 33 g/dL (ref 30.0–36.0)
MCV: 88.1 fL (ref 80.0–100.0)
Platelets: 274 10*3/uL (ref 150–400)
RBC: 3.54 MIL/uL — ABNORMAL LOW (ref 3.87–5.11)
RDW: 13.3 % (ref 11.5–15.5)
WBC: 11.5 10*3/uL — ABNORMAL HIGH (ref 4.0–10.5)
nRBC: 0 % (ref 0.0–0.2)

## 2021-10-10 LAB — COMPREHENSIVE METABOLIC PANEL
ALT: 17 U/L (ref 0–44)
AST: 21 U/L (ref 15–41)
Albumin: 2.4 g/dL — ABNORMAL LOW (ref 3.5–5.0)
Alkaline Phosphatase: 93 U/L (ref 38–126)
Anion gap: 8 (ref 5–15)
BUN: 8 mg/dL (ref 6–20)
CO2: 22 mmol/L (ref 22–32)
Calcium: 6.2 mg/dL — CL (ref 8.9–10.3)
Chloride: 100 mmol/L (ref 98–111)
Creatinine, Ser: 0.7 mg/dL (ref 0.44–1.00)
GFR, Estimated: >60 mL/min (ref >60)
Glucose, Bld: 97 mg/dL (ref 70–99)
Potassium: 4.3 mmol/L (ref 3.5–5.1)
Sodium: 130 mmol/L — ABNORMAL LOW (ref 135–145)
Total Bilirubin: 0.2 mg/dL — ABNORMAL LOW (ref 0.3–1.2)
Total Protein: 5.9 g/dL — ABNORMAL LOW (ref 6.5–8.1)

## 2021-10-10 MED ORDER — FUROSEMIDE 40 MG PO TABS
20.0000 mg | ORAL_TABLET | Freq: Two times a day (BID) | ORAL | Status: DC
  Administered 2021-10-10 – 2021-10-11 (×2): 20 mg via ORAL
  Filled 2021-10-10 (×2): qty 1

## 2021-10-10 MED ORDER — FUROSEMIDE 10 MG/ML IJ SOLN
20.0000 mg | Freq: Once | INTRAMUSCULAR | Status: AC
  Administered 2021-10-10: 20 mg via INTRAVENOUS
  Filled 2021-10-10: qty 2

## 2021-10-10 NOTE — Lactation Note (Signed)
This note was copied from a baby's chart. Lactation Consultation Note  Patient Name: Brittney Cummings [id number] Date: 10/10/2021 Reason for consult: Follow-up assessment;NICU baby Age:27 hours  Answered all of MOB questions about pumping. Informed MOB about IDF when baby is ready. MOB is pumping and transporting EBM to NICU.   Maternal Data  MOB denies breast pain or pumping difficulties. MOB is without copious milk today, as expected.   Feeding Mother's Current Feeding Choice: Breast Milk and Donor Milk   Lactation Tools Discussed/Used Tools: Pump Breast pump type: Double-Electric Breast Pump Reason for Pumping: NICU Pumping frequency: q3h Pumped volume: 5 mL   Consult Status Consult Status: Follow-up Date: 10/10/21 Follow-up type: In-patient    Brittney Cummings 10/10/2021, 9:12 AM

## 2021-10-10 NOTE — Progress Notes (Signed)
Post Partum Day 1 Subjective: no complaints, up ad lib, voiding, tolerating PO, and + flatus  Objective: Blood pressure 132/80, pulse 74, temperature 98 F (36.7 C), temperature source Oral, resp. rate 16, height 4\' 11"  (1.499 m), weight 80 kg, last menstrual period 02/04/2021, SpO2 100 %, unknown if currently breastfeeding.  Physical Exam:  General: alert, cooperative, and no distress Lochia: appropriate Uterine Fundus: firm Incision: n/a DVT Evaluation: No evidence of DVT seen on physical exam.  Recent Labs    10/09/21 1628 10/10/21 0453  HGB 10.8* 10.3*  HCT 33.0* 31.2*   CBC Latest Ref Rng & Units 10/10/2021 10/09/2021 10/09/2021  WBC 4.0 - 10.5 K/uL 11.5(H) 17.0(H) 10.4  Hemoglobin 12.0 - 15.0 g/dL 10.3(L) 10.8(L) 11.1(L)  Hematocrit 36.0 - 46.0 % 31.2(L) 33.0(L) 33.2(L)  Platelets 150 - 400 K/uL 274 303 299    CMP Latest Ref Rng & Units 10/10/2021 10/09/2021 10/08/2021  Glucose 70 - 99 mg/dL 97 93 81  BUN 6 - 20 mg/dL 8 5(L) 9  Creatinine 0.44 - 1.00 mg/dL 0.70 0.60 0.65  Sodium 135 - 145 mmol/L 130(L) 132(L) 135  Potassium 3.5 - 5.1 mmol/L 4.3 4.2 4.1  Chloride 98 - 111 mmol/L 100 103 108  CO2 22 - 32 mmol/L 22 20(L) 19(L)  Calcium 8.9 - 10.3 mg/dL 6.2(LL) 7.1(L) 8.5(L)  Total Protein 6.5 - 8.1 g/dL 5.9(L) 6.5 5.9(L)  Total Bilirubin 0.3 - 1.2 mg/dL 0.2(L) 0.2(L) 0.4  Alkaline Phos 38 - 126 U/L 93 103 95  AST 15 - 41 U/L 21 18 16   ALT 0 - 44 U/L 17 14 13      Assessment/Plan: PPD1  Plan for discharge tomorrow and Breastfeeding CHTN with SIPE/SF on Mg-->off at 1315 Continue labetalol 200 BID, lasix 20 BID x 5  All labs normal(hypocalcemic due to magnesium)    LOS: 4 days   Florian Buff 10/10/2021, 9:39 AM

## 2021-10-10 NOTE — Anesthesia Postprocedure Evaluation (Signed)
Anesthesia Post Note  Patient: Brittney Cummings  Procedure(s) Performed: AN AD Ruth     Patient location during evaluation: Mother Baby Anesthesia Type: Epidural Level of consciousness: awake and alert and oriented Pain management: satisfactory to patient Vital Signs Assessment: post-procedure vital signs reviewed and stable Respiratory status: respiratory function stable Cardiovascular status: stable Postop Assessment: no headache, no backache, epidural receding, patient able to bend at knees, no signs of nausea or vomiting, adequate PO intake and able to ambulate Anesthetic complications: no Comments: The patient stated that she was numb on her left side, but not on her right. She complained to the L&D RN and she turned her on her right side and was told that gravity would let the medicine settle on that side. When she turned to her back and while pushing, her pain returned. The patient said that it was pain and not just pressure. To her knowledge, the MDA was never contacted.    No notable events documented.  Last Vitals:  Vitals:   10/10/21 0640 10/10/21 0839  BP:  132/80  Pulse:  74  Resp: 16   Temp:    SpO2:      Last Pain:  Vitals:   10/10/21 0745  TempSrc:   PainSc: Asleep   Pain Goal: Patients Stated Pain Goal: 4 (10/07/21 1940)                 [name]

## 2021-10-10 NOTE — Progress Notes (Signed)
Patient screened out for psychosocial assessment since none of the following apply: °Psychosocial stressors documented in mother or baby's chart °Gestation less than 32 weeks °Code at delivery  °Infant with anomalies °Please contact the Clinical Social Worker if specific needs arise, by MOB's request, or if MOB scores greater than 9/yes to question 10 on Edinburgh Postpartum Depression Screen. ° °Anicka Stuckert Boyd-Gilyard, MSW, LCSW °Clinical Social Work °[phone] °  °

## 2021-10-11 ENCOUNTER — Ambulatory Visit: Payer: Self-pay

## 2021-10-11 ENCOUNTER — Other Ambulatory Visit (HOSPITAL_COMMUNITY): Payer: Self-pay

## 2021-10-11 LAB — SURGICAL PATHOLOGY

## 2021-10-11 MED ORDER — LABETALOL HCL 200 MG PO TABS
200.0000 mg | ORAL_TABLET | Freq: Two times a day (BID) | ORAL | 2 refills | Status: DC
  Filled 2021-10-11: qty 60, 30d supply, fill #0

## 2021-10-11 MED ORDER — FUROSEMIDE 20 MG PO TABS
20.0000 mg | ORAL_TABLET | Freq: Two times a day (BID) | ORAL | 0 refills | Status: DC
  Filled 2021-10-11: qty 6, 3d supply, fill #0

## 2021-10-11 MED ORDER — IBUPROFEN 600 MG PO TABS
600.0000 mg | ORAL_TABLET | Freq: Four times a day (QID) | ORAL | 0 refills | Status: DC
  Filled 2021-10-11: qty 30, 8d supply, fill #0

## 2021-10-11 NOTE — Lactation Note (Signed)
This note was copied from a baby's chart. Lactation Consultation Note  Patient Name: Brittney Cummings [id number] Date: 10/11/2021 Reason for consult: Follow-up assessment;NICU baby;Late-preterm 34-36.6wks;Breastfeeding assistance ([employer] employee) Age:27 hours  NICU RN Katie called LC to request assistance with latch at 4:30 pm; SLP Anise Salvo was planning on doing a joint consult. LC in the room with [employer] student "Wyatt Mage" but baby did not wake up to eat.  Assisted mom with hand expression and finger feeding (rubbed colostrum in baby's mouth) but baby still not showing cues. Reviewed feeding cues and IDF with mom, advised to start taking baby to a pumped breast only when she's showing readiness and feeding cues. Mom and baby doing STS when exiting the room.  Continue current plan of care. All questions and concerns answered, family to call NICU LC PRN.  Feeding Mother's Current Feeding Choice: Breast Milk and Donor Milk  Interventions Interventions: Assisted with latch;Skin to skin;Infant Driven Feeding Algorithm education;Breast massage;Hand express  Discharge Pump: DEBP;Employee Pump  Consult Status Consult Status: Follow-up Date: 10/11/21 Follow-up type: In-patient   Brittney Cummings Venetia Constable 10/11/2021, 4:59 PM

## 2021-10-11 NOTE — Lactation Note (Signed)
This note was copied from a baby's chart. Lactation Consultation Note  Patient Name: Brittney Cummings [id number] Date: 10/11/2021 Reason for consult: Follow-up assessment;NICU baby;Other (Comment);Infant < 6lbs;Late-preterm 34-36.6wks;Maternal discharge ([employer] employee) Age:27 hours  Visited with mom of 46 hours old LPI NICU female, she's a P3 and reports pumping is going well, her milk is slowly coming in. Mom is going home today, reviewed discharge education, pumping schedule, pumping settings and lactogenesis II-III.   Maternal Data  Mom's supply is still WNL  Feeding Mother's Current Feeding Choice: Breast Milk and Donor Milk  Lactation Tools Discussed/Used Tools: Pump Breast pump type: Double-Electric Breast Pump Pump Education: Setup, frequency, and cleaning;Milk Storage Reason for Pumping: LPI in NICU Pumping frequency: 8 times/24 hours Pumped volume: 10 mL  Interventions Interventions: Breast feeding basics reviewed;Education;DEBP  Plan of care Encourage mom to continue pumping every 3 hours; ideally 8 pumping sessions/24 hours She'll switch from initiation to expression mode  She'll take all pump parts to baby's room   FOB present and supportive, family is bilingual, they speak both, [place] and [place]. All questions and concerns answered, family to call NICU LC PRN.   Discharge Discharge Education: Engorgement and breast care Pump: DEBP;Employee Pump [facility])  Consult Status Consult Status: Follow-up Date: 10/11/21 Follow-up type: In-patient   Jessicah Croll Venetia Constable 10/11/2021, 11:51 AM

## 2021-10-13 ENCOUNTER — Encounter (HOSPITAL_BASED_OUTPATIENT_CLINIC_OR_DEPARTMENT_OTHER): Payer: No Typology Code available for payment source | Admitting: Obstetrics & Gynecology

## 2021-10-13 ENCOUNTER — Ambulatory Visit: Payer: No Typology Code available for payment source

## 2021-10-13 ENCOUNTER — Ambulatory Visit (HOSPITAL_BASED_OUTPATIENT_CLINIC_OR_DEPARTMENT_OTHER): Payer: No Typology Code available for payment source | Admitting: Obstetrics & Gynecology

## 2021-10-14 ENCOUNTER — Encounter: Payer: No Typology Code available for payment source | Admitting: Certified Nurse Midwife

## 2021-10-16 ENCOUNTER — Ambulatory Visit: Payer: Self-pay

## 2021-10-16 NOTE — Lactation Note (Signed)
This note was copied from a baby's chart.  NICU Lactation Consultation Note  Patient Name: Brittney Cummings [id number] Date: 10/16/2021 Age:27 days   Subjective Reason for consult: Follow-up assessment; NICU baby  Lactation followed up with Brittney Cummings. She was attempting to latch baby Brittney Cummings upon entry. Baby was slightly sunny side up on mother's lap. I assisted with repositioning her belly to belly. I also noted that Brittney Cummings was pulling her breast tissue back when latching. I encouraged her to compress the tissue forward and towards baby's mouth to essentially "sandwich" the tissue. We noted baby latched a few times with a good initial tug. She then slid off the nipple.  I encouraged Brittney Cummings to hand express milk and continue to allow baby to lick and practice latching at the breast. Brittney Cummings reports comfort latching. We discussed the possibility of using a nipple shield to help baby better sustain her latch. We also discussed that if she'd prefer to defer using a nipple shield, that baby's ability to maintain her latch should improve with development and time.  Brittney Cummings verbalized understanding. I scheduled a follow up appointment for 2/27 at 1100.  Objective Infant data: Mother's Current Feeding Choice: Breast Milk  Infant feeding assessment Scale for Readiness: 2    Maternal data: [id number]  Vaginal, Spontaneous  Current breast feeding challenges:: LPI; NICU  Does the patient have breastfeeding experience prior to this delivery?: Yes  Pumping frequency: q3 hours Pumped volume: 150 mL   Pump: DEBP, Employee Pump  Assessment Infant: LATCH Score: 7   Maternal: Milk volume: Abundant   Intervention/Plan Interventions: Breast feeding basics reviewed; Assisted with latch; Skin to skin; Hand express; Breast compression; Education  Tools: Pump  Plan: Consult Status: Follow-up  NICU Follow-up type: Assist with IDF-1 (Mother to pre-pump before  breastfeeding)    Lenore Manner 10/16/2021, 3:00 PM

## 2021-10-17 ENCOUNTER — Ambulatory Visit: Payer: Self-pay

## 2021-10-17 NOTE — Lactation Note (Signed)
This note was copied from a baby's chart.  NICU Lactation Consultation Note  Patient Name: Brittney Cummings [id number] Date: 10/17/2021 Age:27 days   Subjective Reason for consult: Follow-up assessment; Breastfeeding assistance  Visited with Mom as she was pumping and preparing to practice lick and learn breastfeeding. LC and [employer]il[facility] Student discussed using nipple shield as a tool for preterm baby. Assisted Mom with putting on nipple shield. Reviewed normal feeding behaviors for a 58 week old infant. Encouraged Mom to put the baby skin to skin after lick and learn session which included a few attempts to latch which were not sustained.   Objective Infant data: Mother's Current Feeding Choice: Breast Milk  Infant feeding assessment Scale for Readiness: 3   Maternal data: [id number]  Vaginal, Spontaneous  Current breast feeding challenges:: LPI; NICU   Does the patient have breastfeeding experience prior to this delivery?: Yes  Pumping frequency: Every 3 hours; 166ml to 240 ml every pumping session Pumped volume: 150 mL   Pump: DEBP, Employee Pump  Assessment Infant: LATCH Score: 6  During lick and learn session, baby was sleepy with a few brief latches but no sustained sucks or swallows. Baby's latch improved with nipple shield. Baby feeding behavior appears normal for age and will likely approve with time, practice and age.   Maternal: Milk volume: Abundant  Mom appeared calm, and competent in her breastfeeding skills. Infant will likely benefit from her previous breastfeeding experience.  Intervention/Plan Interventions: Assisted with latch; Skin to skin; Pre-pump if needed; Position options; Adjust position; Education  Tools: Nipple Shields Nipple shield size: 20  Plan: Consult Status: Follow-up  1) Continue current plan of pumping Q3, before practicing breastfeeding.  2) Mom will call for assistance as needed.   NICU Follow-up type: Assist with IDF-1  (Mother to pre-pump before breastfeeding); Weekly NICU follow up    Hessie Dibble 10/17/2021, 11:42 AM

## 2021-10-19 ENCOUNTER — Ambulatory Visit (INDEPENDENT_AMBULATORY_CARE_PROVIDER_SITE_OTHER): Payer: No Typology Code available for payment source | Admitting: Obstetrics & Gynecology

## 2021-10-19 ENCOUNTER — Other Ambulatory Visit: Payer: Self-pay

## 2021-10-19 ENCOUNTER — Encounter: Payer: No Typology Code available for payment source | Admitting: Certified Nurse Midwife

## 2021-10-19 ENCOUNTER — Other Ambulatory Visit (HOSPITAL_COMMUNITY): Payer: Self-pay

## 2021-10-19 ENCOUNTER — Ambulatory Visit (INDEPENDENT_AMBULATORY_CARE_PROVIDER_SITE_OTHER): Payer: No Typology Code available for payment source

## 2021-10-19 ENCOUNTER — Encounter (HOSPITAL_BASED_OUTPATIENT_CLINIC_OR_DEPARTMENT_OTHER): Payer: No Typology Code available for payment source

## 2021-10-19 ENCOUNTER — Encounter (HOSPITAL_BASED_OUTPATIENT_CLINIC_OR_DEPARTMENT_OTHER): Payer: No Typology Code available for payment source | Admitting: Obstetrics & Gynecology

## 2021-10-19 VITALS — BP 150/90 | HR 82 | Resp 14

## 2021-10-19 VITALS — BP 146/97 | HR 68

## 2021-10-19 DIAGNOSIS — R21 Rash and other nonspecific skin eruption: Secondary | ICD-10-CM

## 2021-10-19 DIAGNOSIS — Z8759 Personal history of other complications of pregnancy, childbirth and the puerperium: Secondary | ICD-10-CM

## 2021-10-19 DIAGNOSIS — O119 Pre-existing hypertension with pre-eclampsia, unspecified trimester: Secondary | ICD-10-CM

## 2021-10-19 DIAGNOSIS — O1003 Pre-existing essential hypertension complicating the puerperium: Secondary | ICD-10-CM | POA: Diagnosis not present

## 2021-10-19 MED ORDER — BETAMETHASONE VALERATE 0.1 % EX OINT
1.0000 "application " | TOPICAL_OINTMENT | Freq: Two times a day (BID) | CUTANEOUS | 0 refills | Status: DC
  Filled 2021-10-19: qty 30, 15d supply, fill #0

## 2021-10-20 ENCOUNTER — Ambulatory Visit (HOSPITAL_BASED_OUTPATIENT_CLINIC_OR_DEPARTMENT_OTHER): Payer: No Typology Code available for payment source | Admitting: Obstetrics & Gynecology

## 2021-10-20 ENCOUNTER — Ambulatory Visit: Payer: No Typology Code available for payment source

## 2021-10-20 NOTE — Progress Notes (Signed)
Patient came in today for BP check. Blood Pressure is elevated. Patient will see Dr. Sabra Heck as an office visit.  ?

## 2021-10-21 ENCOUNTER — Inpatient Hospital Stay (HOSPITAL_COMMUNITY): Payer: No Typology Code available for payment source

## 2021-10-21 ENCOUNTER — Encounter (HOSPITAL_BASED_OUTPATIENT_CLINIC_OR_DEPARTMENT_OTHER): Payer: Self-pay | Admitting: Obstetrics & Gynecology

## 2021-10-21 ENCOUNTER — Inpatient Hospital Stay (HOSPITAL_COMMUNITY)
Admission: AD | Admit: 2021-10-21 | Payer: No Typology Code available for payment source | Source: Home / Self Care | Admitting: Obstetrics and Gynecology

## 2021-10-21 MED ORDER — LABETALOL HCL 200 MG PO TABS: 300.0000 mg | ORAL_TABLET | Freq: Two times a day (BID) | ORAL | 2 refills | Status: DC

## 2021-10-21 NOTE — Progress Notes (Signed)
GYNECOLOGY  VISIT ? ?CC:   post partum BP check ? ?HPI: ?26 y.o. [phone] Married Other or two or more races female here for follow up blood pressure check post partum.  Routine post partum appt will be in another 4-5 weeks. Baby is still in NICU but doing well.  Pt is on labetolol 200mg  bid and did receive lasix for a few days post partum.  Denies SOB or palpitations.  Has no significant LE edema.  Feeling well.  Taking BPs at home and having similar readings.  Will increase labetalol today.  Pt is pumping. ? ?We did discuss contraception.  She is considering an IUD.  Aware this can be placed at post partum visit.  Pt will decide if wants this prior to appointment. ? ? ?Patient Active Problem List  ? Diagnosis Date Noted  ? Positive GBS test 10/09/2021  ? Severe pre-eclampsia 10/06/2021  ? Maternal obesity, antepartum 04/21/2021  ? Chronic hypertension with superimposed preeclampsia 04/13/2021  ? Supervision of high risk pregnancy, antepartum 03/24/2021  ? Lumbar radiculopathy, right 11/26/2020  ? Chronic headaches 07/25/2019  ? ? ?Past Medical History:  ?Diagnosis Date  ? COVID-19 09/01/2020  ? Hypertension   ? ? ?Past Surgical History:  ?Procedure Laterality Date  ? NO PAST SURGERIES    ? ? ?MEDS:   ?Current Outpatient Medications on File Prior to Visit  ?Medication Sig Dispense Refill  ? betamethasone valerate ointment (VALISONE) 0.1 % Apply 1 application topically 2 (two) times daily. Don't use for more than 7 days. 30 g 0  ? furosemide (LASIX) 20 MG tablet Take 1 tablet (20 mg total) by mouth 2 (two) times daily. (Patient not taking: Reported on 10/19/2021) 6 tablet 0  ? ibuprofen (ADVIL) 600 MG tablet Take 1 tablet (600 mg total) by mouth every 6 (six) hours. 30 tablet 0  ? labetalol (NORMODYNE) 200 MG tablet Take 1 tablet (200 mg total) by mouth 2 (two) times daily. 60 tablet 2  ? Prenatal Vit-Fe Fumarate-FA (MULTIVITAMIN-PRENATAL) 27-0.8 MG TABS tablet Take 1 tablet by mouth daily at 12 noon.    ? ?No current  facility-administered medications on file prior to visit.  ? ? ?ALLERGIES: Mushroom extract complex ? ?Family History  ?Problem Relation Age of Onset  ? Seizures Mother   ? High Cholesterol Father   ? Hypertension Father   ? Diabetes Father   ? ? ?SH:  married, non smoker ? ?Review of Systems  ?Constitutional: Negative.   ?Respiratory: Negative.    ?Cardiovascular: Negative.   ? ?PHYSICAL EXAMINATION:   ? ?BP (!) 150/90   Pulse 82   Resp 14   LMP 02/04/2021 (Exact Date)     ?General appearance: alert, cooperative and appears stated age ?CV:  Regular rate and rhythm ?Lungs:  clear to auscultation, no wheezes, rales or rhonchi, symmetric air entry ?LE:  no edema ? ?Assessment/Plan: ?1. Chronic hypertension with superimposed preeclampsia ?- pt will increase labetolol to 300mg  BID.  Has 100mg  and 200mg  tablets at home so will use these ?- recommended continued checking BPs at home and sending me results over the next week to see if we need to continue to increased antihypertensive ? ?2. History of severe pre-eclampsia ?- exam normal today ? ? ? ?

## 2021-10-23 ENCOUNTER — Ambulatory Visit: Payer: Self-pay

## 2021-10-23 NOTE — Lactation Note (Signed)
This note was copied from a baby's chart. ?Lactation Consultation Note ? ?Patient Name: Brittney Cummings ?Today's Date: 10/23/2021 ?Reason for consult: Follow-up assessment;NICU baby;Breastfeeding assistance;Early term 37-38.6wks;Infant < 6lbs;Other (Comment);Mother's request ([employer] employee) ?Age:27 wk.o. ? ?Visited with mom twice, the first time to check on pumping status (baby wasn't ready to go to breast at that time) and the second time for latch assist. Mom reports pumping is going well and her supply has increased; praised her for her efforts. She voiced baby prefers the left side since it's her better producing side, and she'll latch with and without the NS # 20.  ? ?She's been taking baby to breast on a daily basis for about 10-20 minutes/time. Mom voices that baby prefers the breast Vs. The bottle. Provider is planning on having baby ad lib by tomorrow if she continues doing well and gaining weight. ? ?LC came back for the 2 pm feeding, took baby to right breast in cross cradle hold and she was able to latch after a few tries, she was still sleepy (see LATCH score). Mom did a great job at massaging and compressing her breast throughout the entire feeding. Baby self released at the 20 minutes mark, but actual transfer time was 13 minutes where NS pattern was predominant.  ? ?Reviewed lactogenesis III, supply/demand, normal LPI behavior, expectations, feeding cues and IDF 2.  ? ?Maternal Data ? Mom's supply is abundant and ANL ? ?Feeding ?Mother's Current Feeding Choice: Breast Milk ? ?LATCH Score ?Latch: Repeated attempts needed to sustain latch, nipple held in mouth throughout feeding, stimulation needed to elicit sucking reflex. (baby awake and alert during the first 2 minutes of the feeding, she had her eyes closed during the remaining but mom did a great job providing compressions for better flow, Used NS # 20 for this feeding) ? ?Audible Swallowing: A few with stimulation (multiple let downs  and swallows but only with stimulation; pool of EBM on NS # 20 at the end of the feeding) ? ?Type of Nipple: Everted at rest and after stimulation ? ?Comfort (Breast/Nipple): Soft / non-tender (breast felt much softer after this feeding) ? ?Hold (Positioning): No assistance needed to correctly position infant at breast. ? ?LATCH Score: 8 ? ?Lactation Tools Discussed/Used ?Tools: Pump;Nipple [employer] ?Nipple shield size: 20 ?Breast pump type: Double-Electric Breast Pump ?Pump Education: Setup, frequency, and cleaning;Milk Storage ?Reason for Pumping: ETI in NICU ?Pumping frequency: 7-8 times/24 hours ?Pumped volume: 210 mL (210-240 ml) ? ?Interventions ?Interventions: Breast feeding basics reviewed;Assisted with latch;Breast massage;Adjust position;Breast compression;Support pillows;DEBP;Education, IDF algorithm ? ?Plan of care ?Encourage mom to continue pumping every 3 hours; she might go longer at night as long as her breasts "don't wake her up" ?She'll continue putting baby to breast on feeding cues ?Suggested to FOB to start trying bottle feedings when visiting the unit since baby won't take a bottle from mom ?  ?FOB present and supportive, family is bilingual, they speak both, [place] and [place]. All questions and concerns answered, family to call NICU LC PRN. ? ?Discharge ?Pump: DEBP;Employee Pump ? ?Consult Status ?Consult Status: Follow-up ?Date: 10/23/21 ?Follow-up type: In-patient ? ? ?Brittney Cummings ?10/23/2021, 3:44 PM ? ? ? ?

## 2021-10-25 ENCOUNTER — Ambulatory Visit: Payer: Self-pay

## 2021-10-25 NOTE — Lactation Note (Signed)
This note was copied from a baby's chart. ? ?NICU Lactation Consultation Note ? ?Patient Name: Brittney Cummings Name ?Today's Date: 10/25/2021 ?Age:26 years old. ? ? ?Subjective ?Reason for consult: Follow-up assessment; NICU baby ? ?Lactation followed up with Ms. Lesleigh Noe. She is planning for discharge today. I reviewed our community lactation resources and recommended follow up with an OP lactation provider. Ms. Genia Hotter consented to an OP referral. ? ?Baby is breast feeding well, per mom. She will follow up with Family Medicine on [facility]. ? ?Objective ?Infant data: ?Mother's Current Feeding Choice: Breast Milk ? ?Infant feeding assessment ?Scale for Readiness: 1 ?Scale for Quality: 1 ? ?Maternal data: ?[id number]  ?Vaginal, Spontaneous ? ?Pump: DEBP, Employee Pump ? ?Assessment ?Feeding Status: Ad lib ? ?Maternal: ?No data recorded ? ?Intervention/Plan ?Interventions: LC Services brochure ? ?Plan: ?Consult Status: Complete ? ? ? ?Walker Shadow ?10/25/2021, 1:43 PM ?

## 2021-10-27 ENCOUNTER — Encounter: Payer: No Typology Code available for payment source | Admitting: Obstetrics and Gynecology

## 2021-11-03 ENCOUNTER — Encounter: Payer: No Typology Code available for payment source | Admitting: Obstetrics and Gynecology

## 2021-11-09 ENCOUNTER — Encounter: Payer: No Typology Code available for payment source | Admitting: Obstetrics and Gynecology

## 2021-11-14 ENCOUNTER — Other Ambulatory Visit: Payer: Self-pay | Admitting: Obstetrics & Gynecology

## 2021-11-14 ENCOUNTER — Other Ambulatory Visit (HOSPITAL_COMMUNITY): Payer: Self-pay

## 2021-11-15 ENCOUNTER — Other Ambulatory Visit (HOSPITAL_COMMUNITY): Payer: Self-pay

## 2021-11-16 ENCOUNTER — Other Ambulatory Visit (HOSPITAL_COMMUNITY): Payer: Self-pay

## 2021-11-16 ENCOUNTER — Encounter: Payer: No Typology Code available for payment source | Admitting: Certified Nurse Midwife

## 2021-11-16 ENCOUNTER — Other Ambulatory Visit: Payer: Self-pay | Admitting: Obstetrics & Gynecology

## 2021-11-17 ENCOUNTER — Encounter (HOSPITAL_BASED_OUTPATIENT_CLINIC_OR_DEPARTMENT_OTHER): Payer: Self-pay | Admitting: Medical

## 2021-11-17 ENCOUNTER — Other Ambulatory Visit (HOSPITAL_COMMUNITY): Payer: Self-pay

## 2021-11-17 ENCOUNTER — Ambulatory Visit (INDEPENDENT_AMBULATORY_CARE_PROVIDER_SITE_OTHER): Payer: No Typology Code available for payment source | Admitting: Medical

## 2021-11-17 VITALS — BP 124/81 | HR 90 | Ht 59.0 in | Wt 163.6 lb

## 2021-11-17 DIAGNOSIS — Z3009 Encounter for other general counseling and advice on contraception: Secondary | ICD-10-CM

## 2021-11-17 DIAGNOSIS — O119 Pre-existing hypertension with pre-eclampsia, unspecified trimester: Secondary | ICD-10-CM

## 2021-11-17 DIAGNOSIS — Z8759 Personal history of other complications of pregnancy, childbirth and the puerperium: Secondary | ICD-10-CM | POA: Diagnosis not present

## 2021-11-17 DIAGNOSIS — Z0289 Encounter for other administrative examinations: Secondary | ICD-10-CM

## 2021-11-17 MED ORDER — NORETHINDRONE 0.35 MG PO TABS
1.0000 | ORAL_TABLET | Freq: Every day | ORAL | 11 refills | Status: DC
  Filled 2021-11-17: qty 28, 28d supply, fill #0

## 2021-11-17 MED ORDER — LABETALOL HCL 200 MG PO TABS
300.0000 mg | ORAL_TABLET | Freq: Two times a day (BID) | ORAL | 2 refills | Status: DC
  Filled 2021-11-17: qty 90, 30d supply, fill #0

## 2021-11-17 MED ORDER — LABETALOL HCL 200 MG PO TABS
200.0000 mg | ORAL_TABLET | Freq: Two times a day (BID) | ORAL | 2 refills | Status: DC
  Filled 2021-11-17: qty 60, 30d supply, fill #0

## 2021-11-17 NOTE — Progress Notes (Signed)
? ? ?Post Partum Visit Note ? ?Brittney Cummings is a 26 y.o. [phone] female who presents for a postpartum visit. She is 6 weeks postpartum following a normal spontaneous vaginal delivery.  I have fully reviewed the prenatal and intrapartum course. The delivery was at 35.2 gestational weeks.  Anesthesia: epidural. Postpartum course has been uncomplicated. Baby is doing well and is home from the NICU. Baby is feeding by breast. Bleeding no bleeding. Bowel function is normal. Bladder function is normal. Patient is not sexually active. Contraception method is abstinence. Postpartum depression screening: negative. ? ? ?The pregnancy intention screening data noted above was reviewed. Potential methods of contraception were discussed. The patient elected to proceed with No data recorded. ? ? ? ?Health Maintenance Due  ?Topic Date Due  ? HPV VACCINES (1 - 2-dose series) Never done  ? COVID-19 Vaccine (4 - Booster for Pfizer series) 10/22/2020  ? ? ?The following portions of the patient's history were reviewed and updated as appropriate: allergies, current medications, past family history, past medical history, past social history, past surgical history, and problem list. ? ?Review of Systems ?Pertinent items are noted in HPI. ? ?Objective:  ?BP 124/81   Pulse 90   Ht 4\' 11"  (1.499 m)   Wt 163 lb 9.6 oz (74.2 kg)   LMP 02/04/2021 (Exact Date)   Breastfeeding Yes   BMI 33.04 kg/m?   ? ?General:  alert and cooperative ?Neck: prominent cervical lymph nodes noted on the right side, tender to palpation  ? Breasts:  not indicated  ?Lungs: clear to auscultation bilaterally  ?Heart:  regular rate and rhythm, S1, S2 normal, no murmur, click, rub or gallop  ?Abdomen: soft, non-tender; bowel sounds normal; no masses,  no organomegaly   ?Wound N/A  ?GU exam:  not indicated  ?     ?Assessment:  ? ? ?Normal postpartum exam. ?Chronic HTN with superimposed pre-eclampsia   ?Headache and neck pain  ?Plan:  ? ?Essential  components of care per ACOG recommendations: ? ?1.  Mood and well being: Patient with negative depression screening today. Reviewed local resources for support.  ?- Patient tobacco use? No.   ?- hx of drug use? No.   ? ?2. Infant care and feeding:  ?-Patient currently breastmilk feeding? Yes. Reviewed importance of draining breast regularly to support lactation.  ?-Social determinants of health (SDOH) reviewed in EPIC. No concerns ? ?3. Sexuality, contraception and birth spacing ?- Patient does not want a pregnancy in the next year.  Desired family size is 3 children.  ?- Reviewed reproductive life planning. Reviewed contraceptive methods based on pt preferences and effectiveness.  Patient desired Oral Contraceptive today.   ?- Discussed birth spacing of 18 months ? ?4. Sleep and fatigue ?-Encouraged family/partner/community support of 4 hrs of uninterrupted sleep to help with mood and fatigue ? ?5. Physical Recovery  ?- Discussed patients delivery and complications. She describes her labor as good. ?- Patient had a Vaginal, no problems at delivery. Patient had  no  laceration. Perineal healing reviewed. Patient expressed understanding ?- Patient has urinary incontinence? No. ?- Patient is safe to resume physical and sexual activity ? ?6.  Health Maintenance ?- HM due items addressed Yes ?- Last pap smear  ?Diagnosis  ?Date Value Ref Range Status  ?01/25/2021   Final  ? - Negative for intraepithelial lesion or malignancy (NILM)  ? Pap smear not done at today's visit.  ?-Breast Cancer screening indicated? No.  ? ?7. Chronic Disease/Pregnancy Condition follow  up: Hypertension ?- PCP follow up, patient will call [facility] ?- Refill of Labetalol sent  ? ?8. Viral URI ?- Swelling of the lymph nodes with recent congestion and frontal headache suggest viral URI given symptoms started only a few days ago and patient has normal BP today.  ?- Warning signs for worsening condition discussed and reasons to seek emergency care  discussed today.  ? ?Kerry Hough, PA-C ?[facility] ? ?

## 2021-11-17 NOTE — Patient Instructions (Signed)

## 2021-11-25 ENCOUNTER — Other Ambulatory Visit (HOSPITAL_COMMUNITY): Payer: Self-pay

## 2021-12-03 ENCOUNTER — Encounter: Payer: Self-pay | Admitting: Radiology

## 2022-01-24 ENCOUNTER — Encounter: Payer: Self-pay | Admitting: *Deleted

## 2022-09-27 IMAGING — MR MR LUMBAR SPINE W/O CM
4 of 5 series · 19 of 48 positions shown · non-contrast
Comparison: Radiographs October 05, 20.

CLINICAL DATA: Low back pain.  Fall 10/03/2020.

EXAM:
MRI LUMBAR SPINE WITHOUT CONTRAST
TECHNIQUE: Multiplanar, multisequence MR imaging of the lumbar spine was
performed. No intravenous contrast was administered.

[Series 3: T2 · sagittal · 4.0mm · 0.55mm/px · 7 of 15 slices shown (1 of 2)]
[im 1/15]
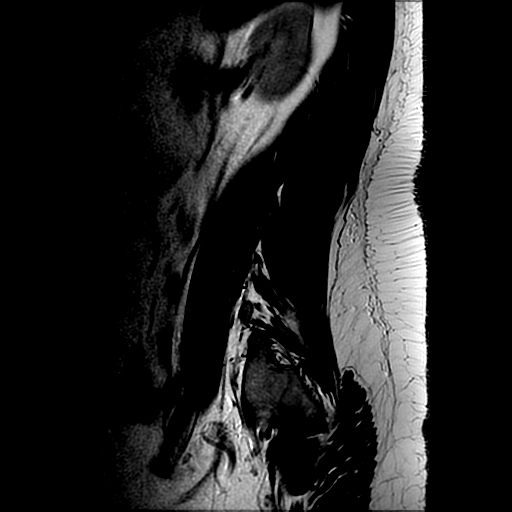
[im 3/15]
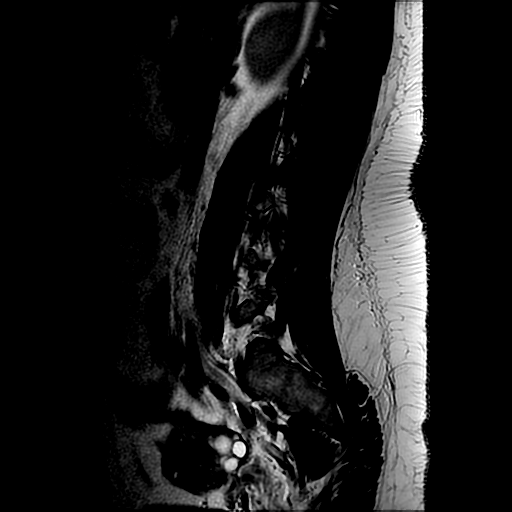
[im 5/15]
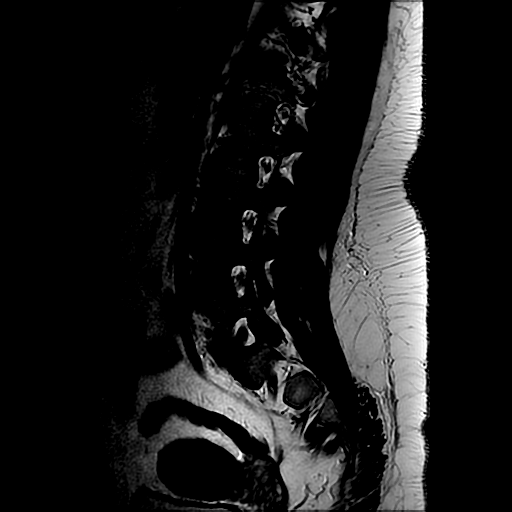
[im 8/15]
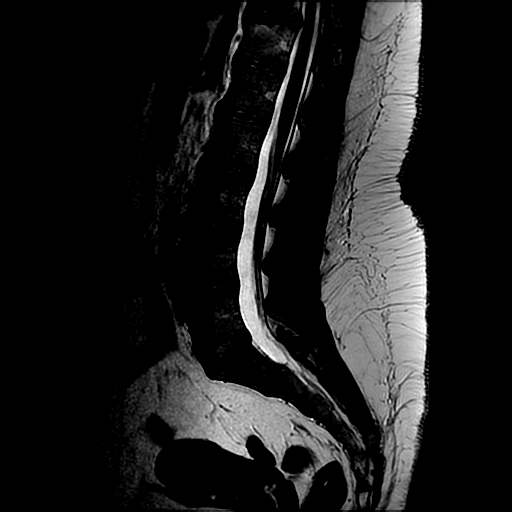
[im 10/15]
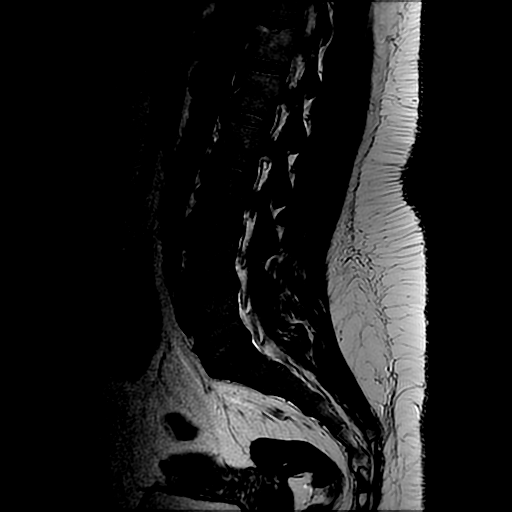
[im 12/15]
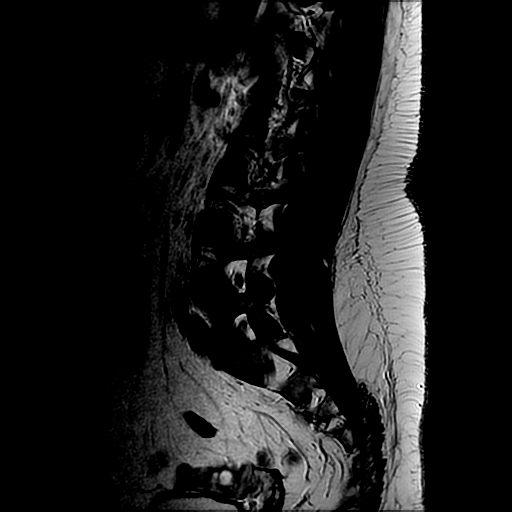
[im 15/15]
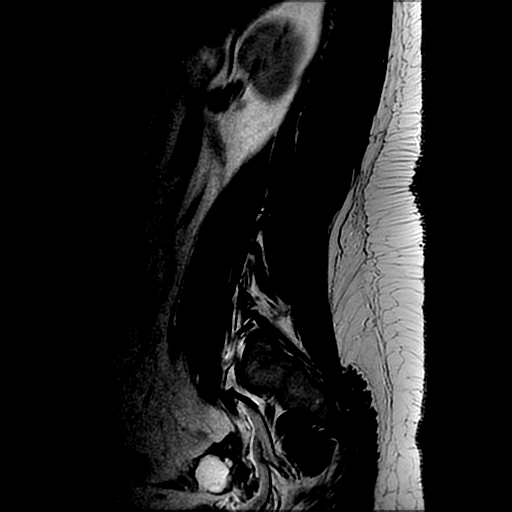

[Series 4: T1 · sagittal · 4.0mm · 0.55mm/px · 3 of 15 slices shown (1 of 2)]
[im 3/15]
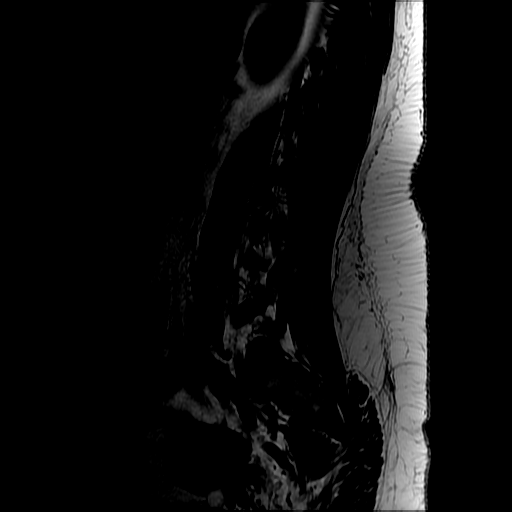
[im 8/15]
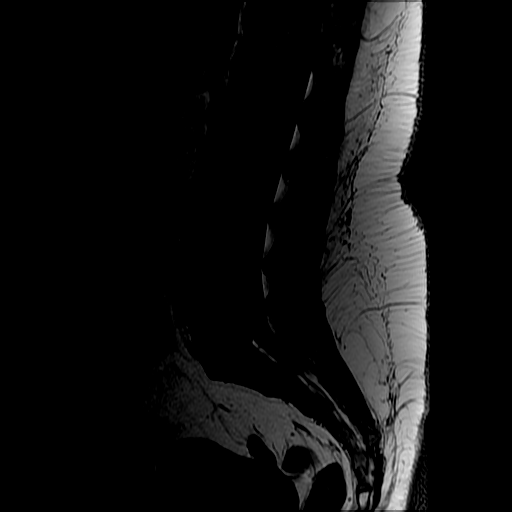
[im 12/15]
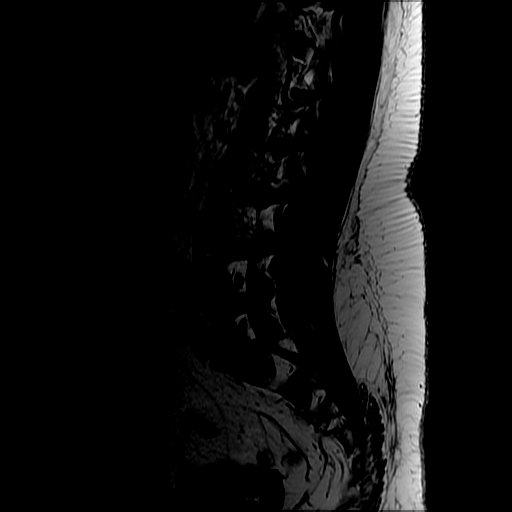

[Series 6: T2 · axial · 4.0mm · 0.39mm/px · z∈[-49,+86]mm · 6 of 33 slices shown (2 of 2)]
[im 1/33]
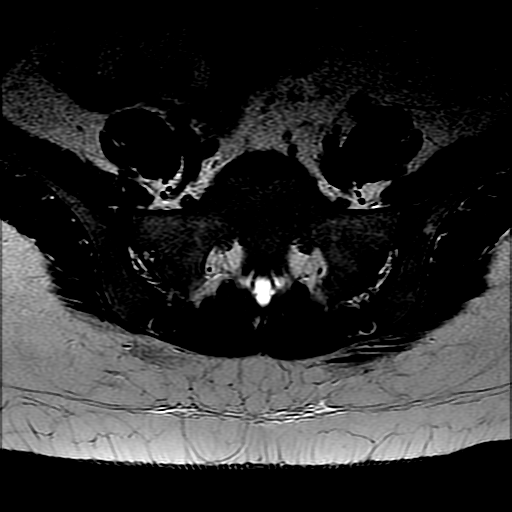
[im 5/33]
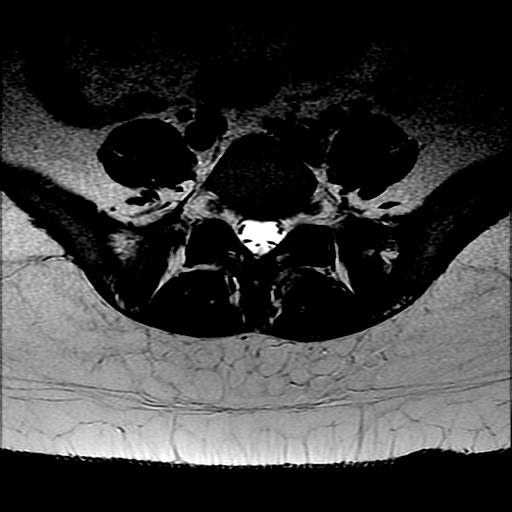
[im 10/33]
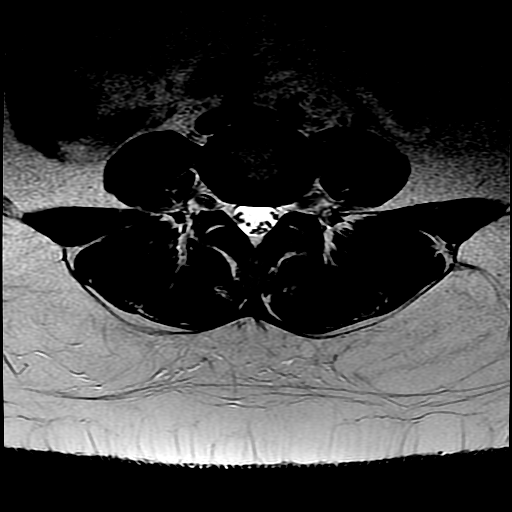
[im 15/33]
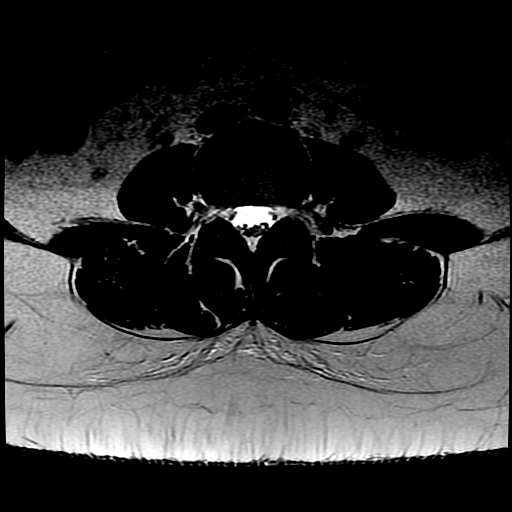
[im 18/33]
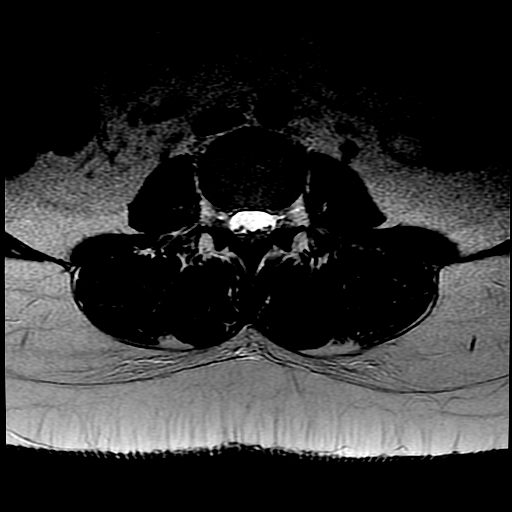
[im 28/33]
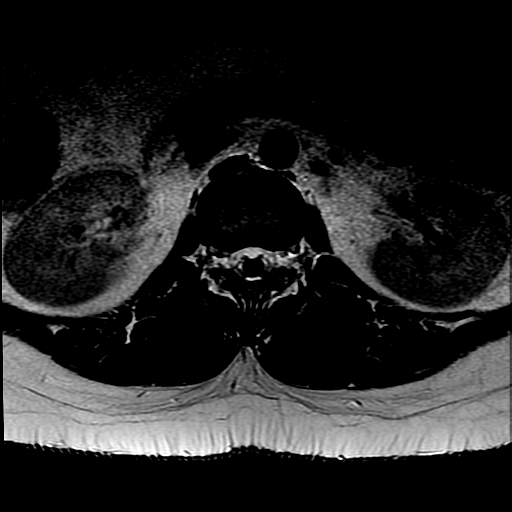

[Series 7: T1 · axial · 4.0mm · 0.39mm/px · z∈[-29,+86]mm · 3 of 33 slices shown (2 of 2)]
[im 5/33]
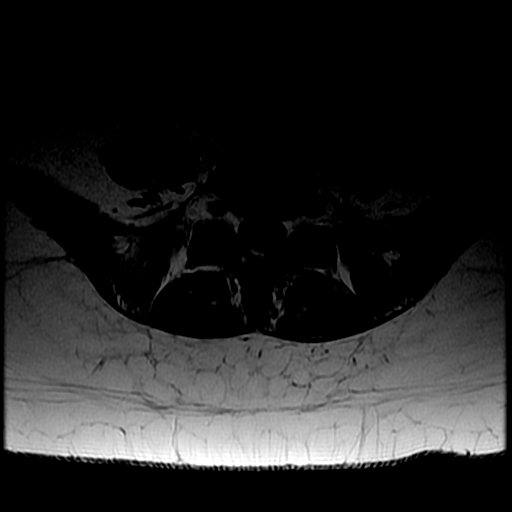
[im 18/33]
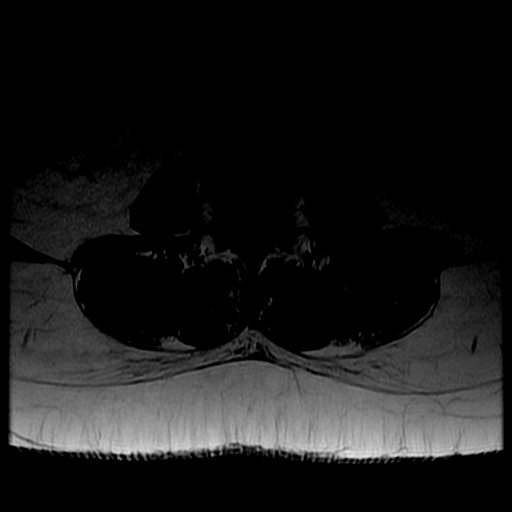
[im 28/33]
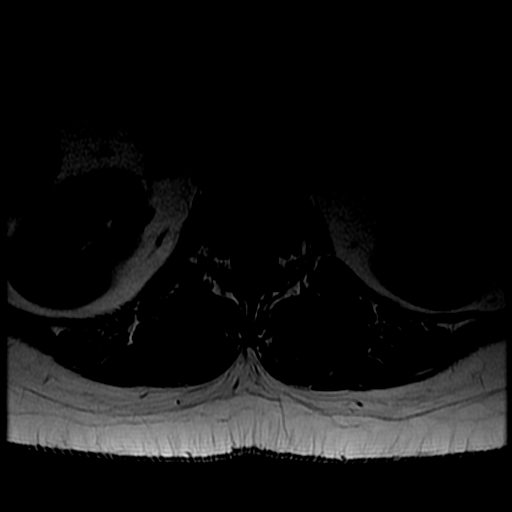

[19 of 48 positions shown; findings below may reference images not displayed]

FINDINGS: Segmentation: Standard segmentation soon. The inferior-most fully
formed intervertebral disc labeled L5-S1.

Alignment:  No sagittal malalignment.

Vertebrae: Mild T12 height loss anteriorly. Multiple degenerative
Schmorl's nodes about the T11-T12 disc, including an edematous T12
superior endplate Schmorl's node. No other bone marrow edema to
suggest acute/recent fracture. Otherwise, vertebral body heights are
maintained. No specific evidence of discitis/osteomyelitis or
suspicious bone lesion.

Conus medullaris and cauda equina: Conus extends to the L2-L3 level.
Conus appears normal.

Paraspinal and other soft tissues: Unremarkable.

Disc levels:

T12-L1: Incompletely imaged axially without evidence of canal or
foraminal stenosis.

L1-L2: No significant disc protrusion, foraminal stenosis, or canal
stenosis.

L2-L3: No significant disc protrusion, foraminal stenosis, or canal
stenosis.

L3-L4: No significant disc protrusion, foraminal stenosis, or canal
stenosis.

L4-L5: No significant disc protrusion, foraminal stenosis, or canal
stenosis.

L5-S1: Mild disc bulge without significant canal or foraminal
stenosis.
IMPRESSION: 1. Mild T12 height loss anteriorly, favored degenerative or chronic
given multiple degenerative Schmorl's nodes about the T11-T12 disc,
including an edematous T12 superior endplate Schmorl's node. No
other bone marrow edema to suggest acute/recent fracture.
2. No significant canal or foraminal stenosis.

## 2022-11-21 ENCOUNTER — Ambulatory Visit (INDEPENDENT_AMBULATORY_CARE_PROVIDER_SITE_OTHER): Payer: 59 | Admitting: Family Medicine

## 2022-11-21 ENCOUNTER — Other Ambulatory Visit (HOSPITAL_COMMUNITY): Payer: Self-pay

## 2022-11-21 ENCOUNTER — Encounter: Payer: Self-pay | Admitting: Family Medicine

## 2022-11-21 VITALS — BP 164/109 | HR 90 | Ht 59.0 in | Wt 181.1 lb

## 2022-11-21 DIAGNOSIS — I10 Essential (primary) hypertension: Secondary | ICD-10-CM | POA: Diagnosis not present

## 2022-11-21 DIAGNOSIS — Z0000 Encounter for general adult medical examination without abnormal findings: Secondary | ICD-10-CM | POA: Diagnosis not present

## 2022-11-21 DIAGNOSIS — E669 Obesity, unspecified: Secondary | ICD-10-CM

## 2022-11-21 DIAGNOSIS — Z13220 Encounter for screening for lipoid disorders: Secondary | ICD-10-CM | POA: Diagnosis not present

## 2022-11-21 LAB — POCT GLYCOSYLATED HEMOGLOBIN (HGB A1C): Hemoglobin A1C: 5.2 % (ref 4.0–5.6)

## 2022-11-21 MED ORDER — AMLODIPINE BESYLATE 5 MG PO TABS
5.0000 mg | ORAL_TABLET | Freq: Every day | ORAL | 0 refills | Status: DC
  Filled 2022-11-21 – 2022-12-15 (×2): qty 90, 90d supply, fill #0

## 2022-11-21 MED ORDER — OLMESARTAN MEDOXOMIL 20 MG PO TABS
20.0000 mg | ORAL_TABLET | Freq: Every day | ORAL | 0 refills | Status: DC
  Filled 2022-11-21 – 2022-12-15 (×2): qty 90, 90d supply, fill #0
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
## 2022-11-21 NOTE — Patient Instructions (Addendum)
It was wonderful to see you today.  Please bring ALL of your medications with you to every visit.   Today we talked about:  Your blood pressure is elevated today.  I am starting you on 2 medications to help control your blood pressure.  Take them daily.  Please return in 2 weeks for blood pressure check and to check some lab work.  We can also check your cholesterol at that time.  The blood pressure medication is not safe in pregnancy, so be sure to continue your birth control or let me know if you desire pregnancy again so that we can make adjustments.   Today at your annual preventive visit we talked about the following measures: I recommend 150 minutes of exercise per week-try 30 minutes 5 days per week We discussed reducing sugary beverages (like soda and juice) and increasing leafy greens and whole fruits.  We discussed avoiding tobacco and alcohol.  I recommend avoiding illicit substances.  Your blood pressure is not at goal of  <140/90.   Thank you for coming to your visit as scheduled. We have had a large "no-show" problem lately, and this significantly limits our ability to see and care for patients. As a friendly reminder- if you cannot make your appointment please call to cancel. We do have a no show policy for those who do not cancel within 24 hours. Our policy is that if you miss or fail to cancel an appointment within 24 hours, 3 times in a 72-month period, you may be dismissed from our clinic.   Thank you for choosing [facility].   Please call [phone] with any questions about today's appointment.  Please be sure to schedule follow up at the front  desk before you leave today.   Sharion Settler, DO PGY-3 Family Medicine

## 2022-11-23 ENCOUNTER — Telehealth: Payer: 59 | Admitting: Urgent Care

## 2022-11-23 ENCOUNTER — Encounter: Payer: Self-pay | Admitting: Urgent Care

## 2022-11-23 DIAGNOSIS — J301 Allergic rhinitis due to pollen: Secondary | ICD-10-CM | POA: Diagnosis not present

## 2022-11-23 MED ORDER — AZELASTINE HCL 0.1 % NA SOLN: 1.0000 | Freq: Two times a day (BID) | NASAL | 0 refills | Status: DC

## 2022-11-23 MED ORDER — FLUTICASONE PROPIONATE 50 MCG/ACT NA SUSP: 2.0000 | Freq: Every day | NASAL | 6 refills | Status: DC

## 2022-11-23 MED ORDER — LEVOCETIRIZINE DIHYDROCHLORIDE 5 MG PO TABS: 5.0000 mg | ORAL_TABLET | Freq: Every evening | ORAL | 0 refills | Status: DC

## 2022-11-23 NOTE — Progress Notes (Signed)
E visit for Allergic Rhinitis We are sorry that you are not feeling well.  Here is how we plan to help!  Based on what you have shared with me it looks like you have Allergic Rhinitis.  Rhinitis is when a reaction occurs that causes nasal congestion, runny nose, sneezing, and itching.  Most types of rhinitis are caused by an inflammation and are associated with symptoms in the eyes ears or throat. There are several types of rhinitis.  The most common are acute rhinitis, which is usually caused by a viral illness, allergic or seasonal rhinitis, and nonallergic or year-round rhinitis.  Nasal allergies occur certain times of the year.  Allergic rhinitis is caused when allergens in the air trigger the release of histamine in the body.  Histamine causes itching, swelling, and fluid to build up in the fragile linings of the nasal passages, sinuses and eyelids.  An itchy nose and clear discharge are common.  I recommend the following over the counter treatments: Xyzal 5 mg take 1 tablet daily (stop your claritin)  I also would recommend a nasal spray: Flonase 2 sprays into each nostril once daily and azelastine twice daily  You may also benefit from eye drops such as: Visine 1-2 drops each eye twice daily as needed  HOME CARE:  You can use an over-the-counter saline nasal spray as needed Avoid areas where there is heavy dust, mites, or molds Stay indoors on windy days during the pollen season Keep windows closed in home, at least in bedroom; use air conditioner. Use high-efficiency house air filter Keep windows closed in car, turn AC on re-circulate Avoid playing out with dog during pollen season  GET HELP RIGHT AWAY IF:  If your symptoms do not improve within 10 days You become short of breath You develop yellow or green discharge from your nose for over 3 days You have coughing fits  MAKE SURE YOU:  Understand these instructions Will watch your condition Will get help right away if you  are not doing well or get worse  Thank you for choosing an e-visit. Your e-visit answers were reviewed by a board certified advanced clinical practitioner to complete your personal care plan. Depending upon the condition, your plan could have included both over the counter or prescription medications. Please review your pharmacy choice. Be sure that the pharmacy you have chosen is open so that you can pick up your prescription now.  If there is a problem you may message your provider in [place] to have the prescription routed to another pharmacy. Your safety is important to [place]. If you have drug allergies check your prescription carefully.  For the next 24 hours, you can use MyChart to ask questions about today's visit, request a non-urgent call back, or ask for a work or school excuse from your e-visit provider. You will get an email in the next two days asking about your experience. I hope that your e-visit has been valuable and will speed your recovery.       I have spent 5 minutes in review of e-visit questionnaire, review and updating patient chart, medical decision making and response to patient.   [place], PA

## 2022-11-24 ENCOUNTER — Other Ambulatory Visit (HOSPITAL_COMMUNITY): Payer: Self-pay

## 2022-12-04 ENCOUNTER — Other Ambulatory Visit (HOSPITAL_COMMUNITY): Payer: Self-pay

## 2022-12-15 ENCOUNTER — Other Ambulatory Visit (HOSPITAL_COMMUNITY): Payer: Self-pay

## 2023-02-01 IMAGING — US US OB COMP LESS 14 WK
1 series · 15 of 28 positions shown · non-contrast
Comparison: None.

CLINICAL DATA: Pain in early pregnancy

EXAM:
OBSTETRIC <14 WK ULTRASOUND
TECHNIQUE: Transabdominal ultrasound was performed for evaluation of the
gestation as well as the maternal uterus and adnexal regions.

[Series 1: us ob comp less 14 wk · 15 of 42 slices shown]
[im 1/42]
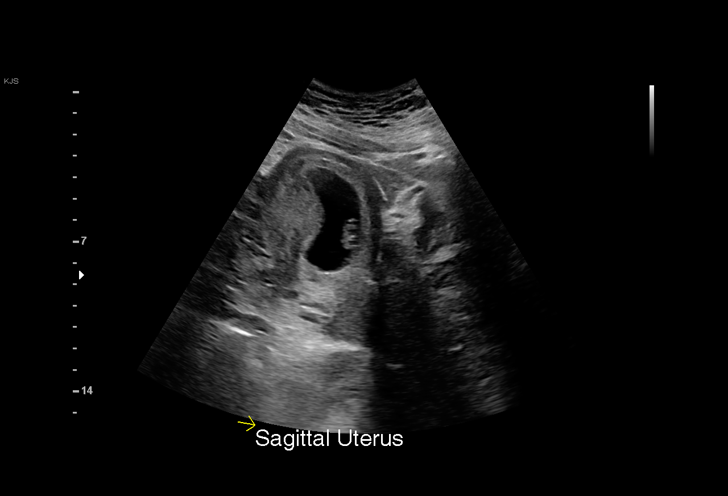
[im 4/42]
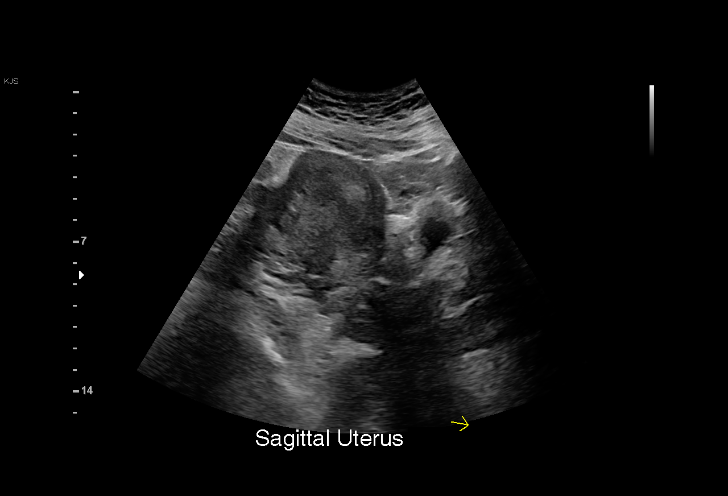
[im 7/42]
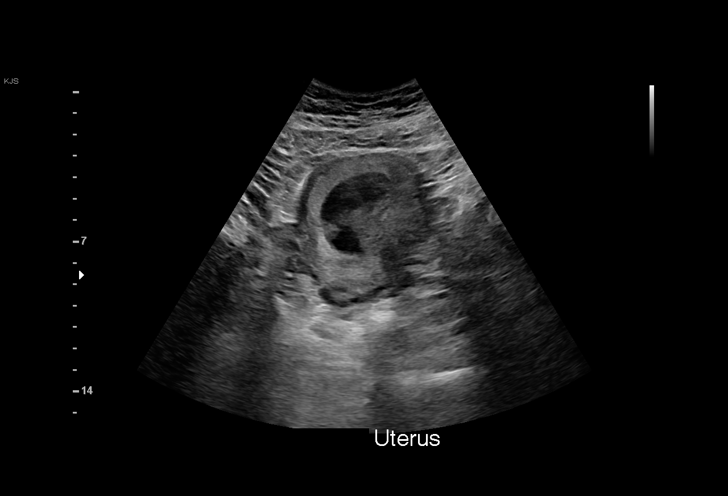
[im 10/42]
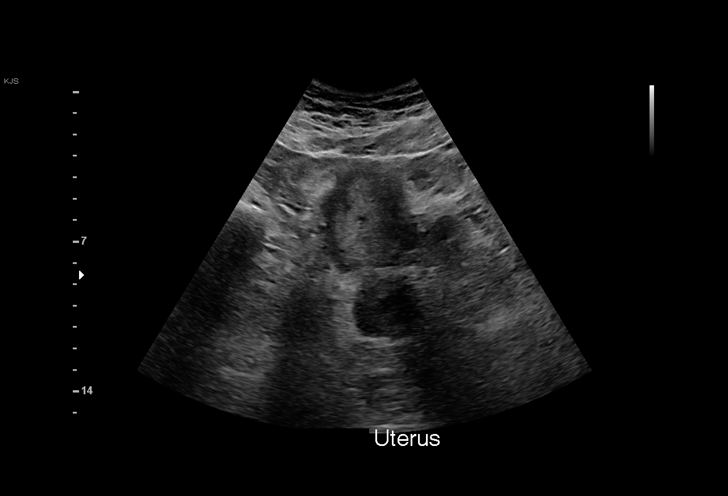
[im 13/42]
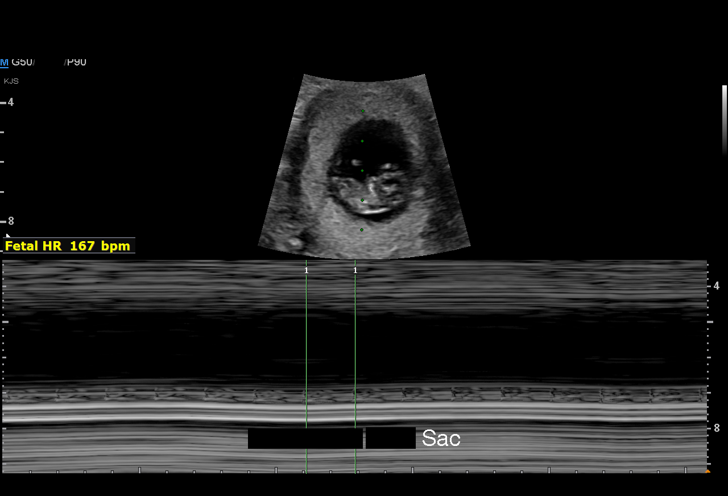
[im 16/42]
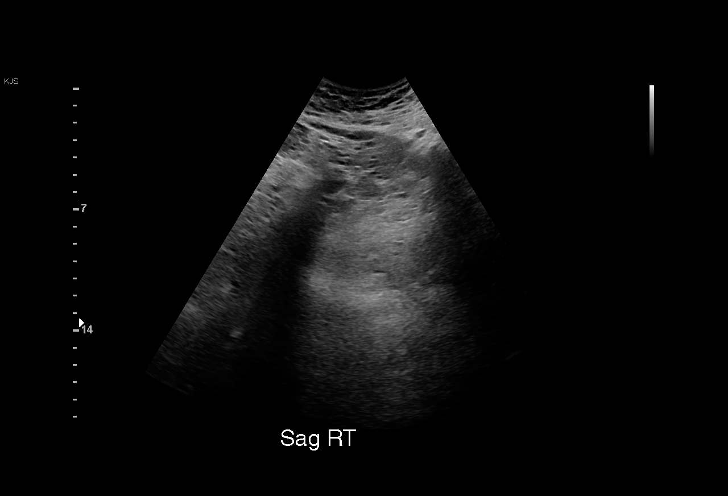
[im 19/42]
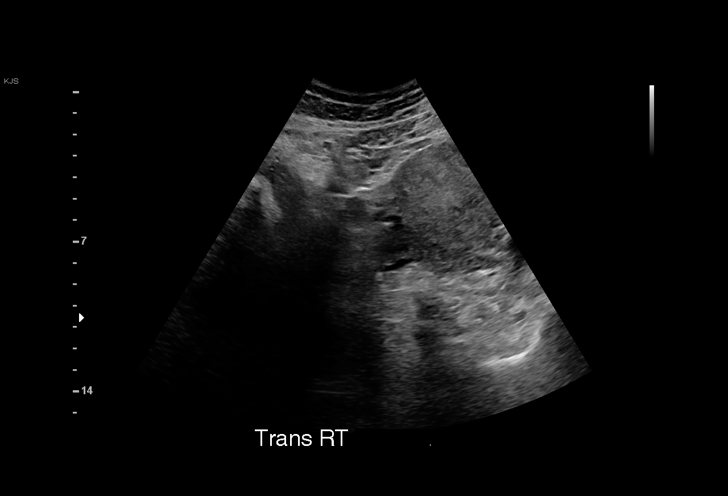
[im 22/42]
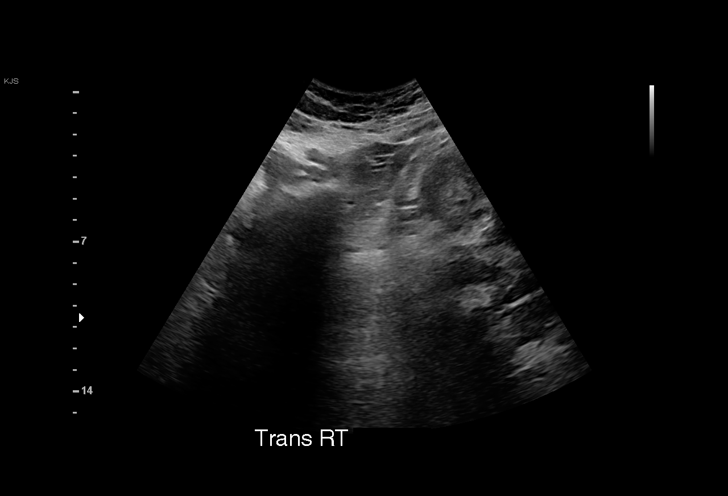
[im 23/42]
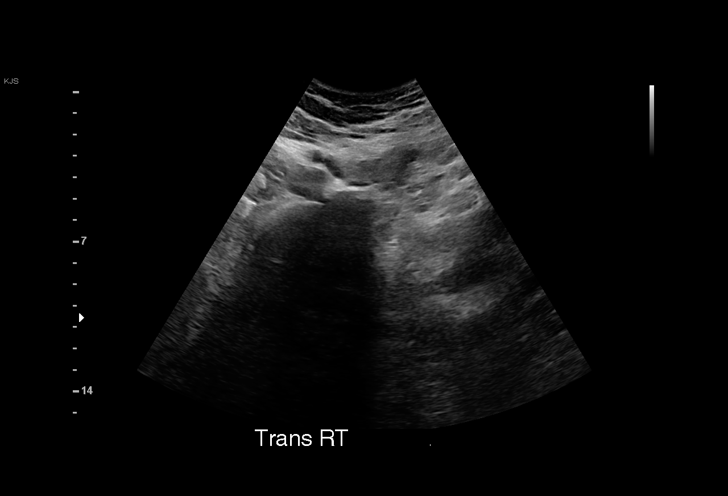
[im 26/42]
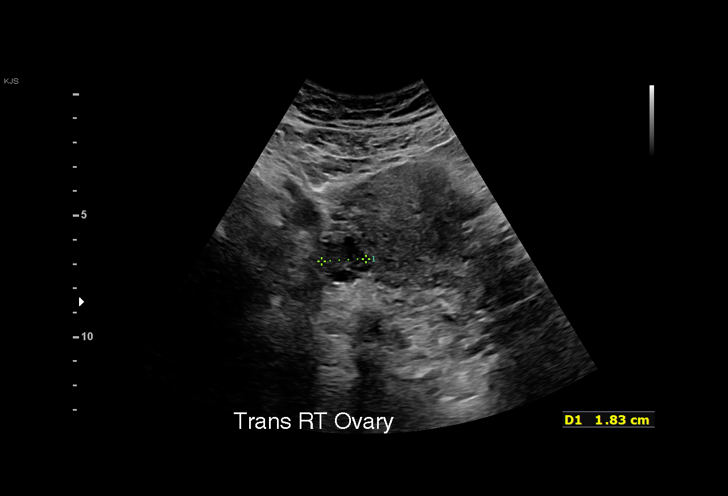
[im 29/42]
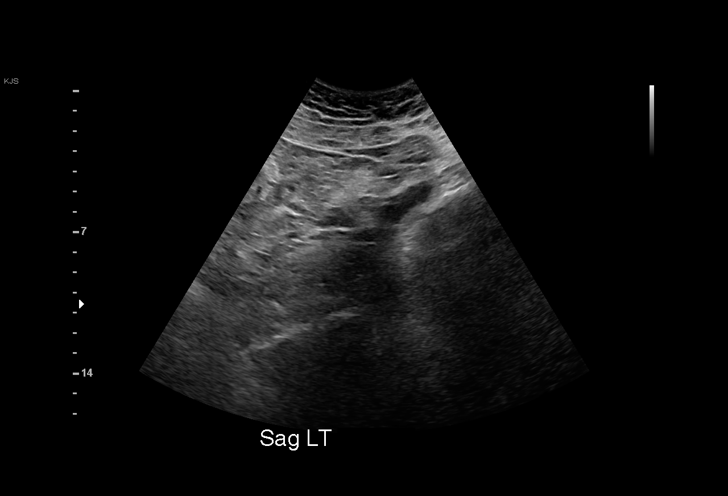
[im 32/42]
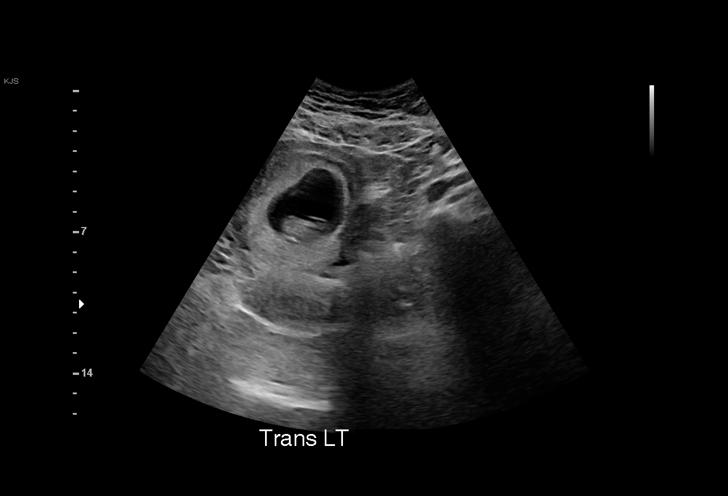
[im 35/42]
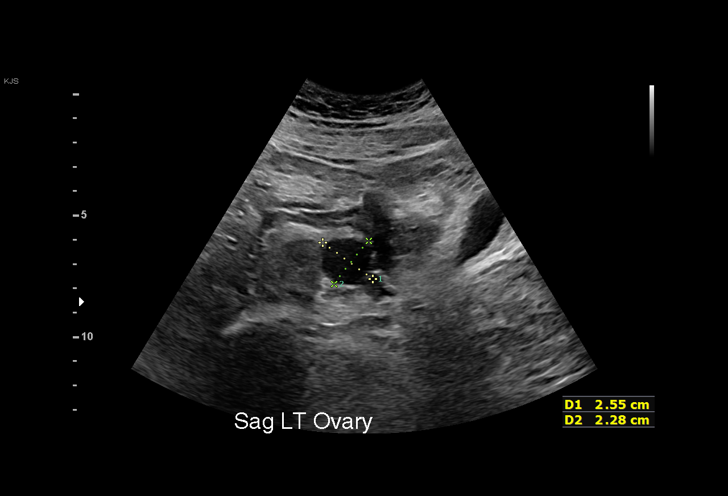
[im 38/42]
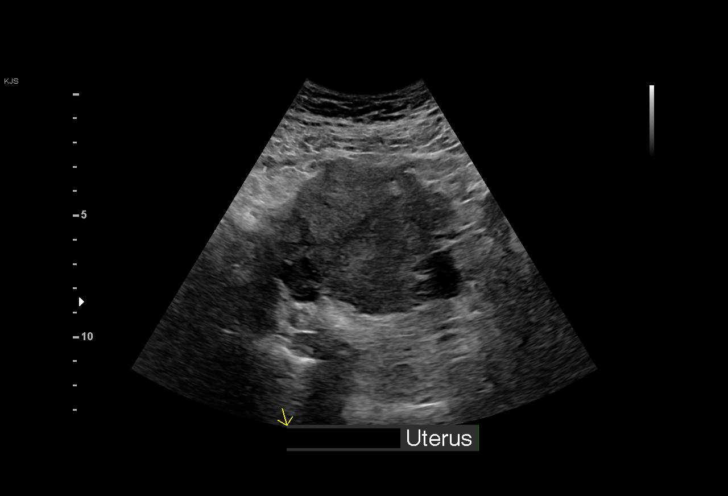
[im 42/42]
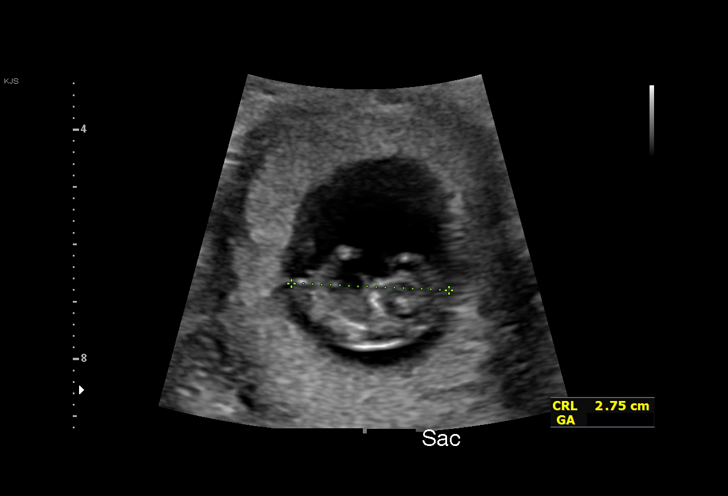

[15 of 28 positions shown; findings below may reference images not displayed]

FINDINGS: Intrauterine gestational sac: Single

Yolk sac:  Not visualized

Embryo:  Visualized

Cardiac Activity: Visualized

Heart Rate: 167 bpm

CRL:   28 mm   9 w 4 d                  US EDC: 11/12/2021

Maternal uterus/adnexae:

Subchorionic hemorrhage: None

Right ovary: Normal

Left ovary: Normal

Other :None

Free fluid:  None
IMPRESSION: Single living intrauterine gestation with an estimated gestational
age of 9 weeks and 4 days. No complicating features identified.

## 2023-02-12 ENCOUNTER — Encounter: Payer: Self-pay | Admitting: Family Medicine

## 2023-02-12 ENCOUNTER — Other Ambulatory Visit (HOSPITAL_COMMUNITY): Payer: Self-pay

## 2023-02-12 ENCOUNTER — Ambulatory Visit (INDEPENDENT_AMBULATORY_CARE_PROVIDER_SITE_OTHER): Payer: 59 | Admitting: Family Medicine

## 2023-02-12 VITALS — BP 160/115 | HR 117 | Ht 59.0 in | Wt 176.1 lb

## 2023-02-12 DIAGNOSIS — S80862A Insect bite (nonvenomous), left lower leg, initial encounter: Secondary | ICD-10-CM

## 2023-02-12 DIAGNOSIS — I10 Essential (primary) hypertension: Secondary | ICD-10-CM

## 2023-02-12 DIAGNOSIS — W57XXXA Bitten or stung by nonvenomous insect and other nonvenomous arthropods, initial encounter: Secondary | ICD-10-CM | POA: Diagnosis not present

## 2023-02-12 MED ORDER — HYDROCORTISONE 2.5 % EX OINT
TOPICAL_OINTMENT | Freq: Two times a day (BID) | CUTANEOUS | 0 refills | Status: DC
  Filled 2023-02-12: qty 28.35, 15d supply, fill #0
  Filled 2023-03-08: qty 28.35, 14d supply, fill #0

## 2023-02-12 NOTE — Assessment & Plan Note (Signed)
Uncontrolled, significantly elevated today which appears consistent with last office visit.  Secondary to medication nonadherence. I advised patient to take her amlodipine and olmesartan as prescribed, follow-up in 1 month.

## 2023-02-12 NOTE — Progress Notes (Signed)
    SUBJECTIVE:   CHIEF COMPLAINT / HPI:  Chief Complaint  Patient presents with   Insect Bite    Patient reports that she was doing work outside by the pool yesterday afternoon when she noticed 2 different bug bites.  She thought she saw an ant, possibly a Animator.  She has had a lot of pain in the left lower leg where she was bitten ever since then.  She has had some mild itchiness.   Later that evening, she felt like her throat was closing up as if she was having allergic reaction similar to reaction she had to mushrooms in the past.  She took some Benadryl yesterday evening and again this morning which did help with her symptoms.  No longer feels throat closing sensation. However, she has developed sore throat, cough, and congestion.  She has had 2 children at home with similar symptoms.  At last visit with PCP for physical, she was started on amlodipine 5 mg and olmesartan 20 mg.  Patient reports that she has not been taking her medications consistently, last time she took the medications was about 1 week ago.  PERTINENT  PMH / PSH: HTN  Patient Care Team: Sabino Dick, DO as PCP - General (Family Medicine)   OBJECTIVE:   BP (!) 160/115   Pulse (!) 117   Ht 4\' 11"  (1.499 m)   Wt 176 lb 2 oz (79.9 kg)   SpO2 100%   BMI 35.57 kg/m   Physical Exam Constitutional:      General: She is not in acute distress. HENT:     Head: Normocephalic and atraumatic.  Cardiovascular:     Rate and Rhythm: Normal rate and regular rhythm.  Pulmonary:     Effort: Pulmonary effort is normal. No respiratory distress.     Breath sounds: Normal breath sounds.  Musculoskeletal:     Cervical back: Neck supple.  Skin:    Comments: Posterior left lower leg with 2 separate lesions, central area of erythema with surrounding erythema and swelling approximately 5 cm and irregular  Neurological:     Mental Status: She is alert.            {Show previous vital signs  (optional):23777}    ASSESSMENT/PLAN:   Problem List Items Addressed This Visit       Cardiovascular and Mediastinum   Hypertension    Uncontrolled, significantly elevated today which appears consistent with last office visit.  Secondary to medication nonadherence. I advised patient to take her amlodipine and olmesartan as prescribed, follow-up in 1 month.      Other Visit Diagnoses     Insect bite of left lower leg, initial encounter    -  Primary   Relevant Medications   hydrocortisone 2.5 % ointment      Patient presenting with insect bites of left lower extremity since yesterday, possible fire ant bite.  She appears to have some sort of allergic reaction to the insect bites which occurred yesterday and resolved with diphenhydramine. - trial topical corticosteroid - start acetaminophen for pain - ED precautions discussed    Return in about 4 weeks (around 03/12/2023) for f/u HTN.   Littie Deeds, MD [facility]

## 2023-02-12 NOTE — Patient Instructions (Addendum)
It was nice seeing you today!  Use the hydrocortisone cream twice a day until bite has healed.  You can take Tylenol as needed for pain.  Please take your medications as prescribed and follow-up next month for blood pressure recheck.  Stay well, Brittney Deeds, MD [facility] [phone]  --  Make sure to check out at the front desk before you leave today.  Please arrive at least 15 minutes prior to your scheduled appointments.  If you had blood work today, I will send you a MyChart message or a letter if results are normal. Otherwise, I will give you a call.  If you had a referral placed, they will call you to set up an appointment. Please give [place] a call if you don't hear back in the next 2 weeks.  If you need additional refills before your next appointment, please call your pharmacy first.

## 2023-02-26 ENCOUNTER — Other Ambulatory Visit (HOSPITAL_COMMUNITY): Payer: Self-pay

## 2023-03-08 ENCOUNTER — Other Ambulatory Visit (HOSPITAL_COMMUNITY): Payer: Self-pay

## 2023-03-08 ENCOUNTER — Other Ambulatory Visit: Payer: Self-pay | Admitting: Family Medicine

## 2023-03-08 ENCOUNTER — Telehealth: Payer: 59 | Admitting: Physician Assistant

## 2023-03-08 DIAGNOSIS — T63481A Toxic effect of venom of other arthropod, accidental (unintentional), initial encounter: Secondary | ICD-10-CM

## 2023-03-08 MED ORDER — PREDNISONE 20 MG PO TABS
40.0000 mg | ORAL_TABLET | Freq: Every day | ORAL | 0 refills | Status: DC
  Filled 2023-03-08: qty 10, 5d supply, fill #0

## 2023-03-08 NOTE — Progress Notes (Signed)
Message sent to patient requesting further input regarding current symptoms. Awaiting patient response.  

## 2023-03-08 NOTE — Progress Notes (Signed)
I have spent 5 minutes in review of e-visit questionnaire, review and updating patient chart, medical decision making and response to patient.   William Cody Martin, PA-C    

## 2023-03-08 NOTE — Progress Notes (Signed)
E-Visit for Insect Sting  Thank you for describing the insect sting for [place].  Here is how we plan to help!  Based on the information you have shared with me it looks like you have:  A sting with significant local reaction and possible mild systemic reaction. Because this has happened before and again now, this will likely continue to worsen each time. As such, for now since breathing symptoms are improved on own and not worsening again, I will give you a course of prednisone to take as directed. If you are able to give baby formula or used saved breast milk, I recommend doing so, but this medication is safe to take while breastfeeding now since we are doing a short course and not very high doses. You need to contact your PCP for follow-up so they can get you set up with an allergist and likely EPI-Pen to keep on hand.   The 2 greatest risks from insect stings are allergic reaction, which can be fatal in some people and infection, which is more common and less serious.  Bees, wasps, yellow jackets, and hornets belong to a class of insects called Hymenoptera.  Most insect stings cause only minor discomfort.  Stings can happen anywhere on the body and can be painful.  Most stings are from honey bees or yellow jackets.  Fire ants can sting multiple times.  The sites of the stings are more likely to become infected.    What can be used to prevent Insect Stings?  Insect repellant with at least 20% DEET.  Wearing long pants and shirts with socks and shoes.  Wear dark or drab-colored clothes rather than bright colors.  Avoid using perfumes and hair sprays; these attract insects.  HOME CARE ADVICE:  1. Stinger removal: The stinger looks like a tiny black dot in the sting. Use a fingernail, credit card edge, or knife-edge to scrape it off.  Don't pull it out because it squeezes out more venom. If the stinger is below the skin surface, leave it alone.  It will be shed with normal skin healing. 2. Use  cold compresses to the area of the sting for 10-20 minutes.  You may repeat this as needed to relieve symptoms of pain and swelling. 3.  For pain relief, take acetominophen 650 mg 4-6 hours as needed or ibuprofen 400 mg every 6-8 hours as needed or naproxen 250-500 mg every 12 hours as needed. 4.  You can also use hydrocortisone cream 0.5% or 1% up to 4 times daily as needed for itching. 5.  If the sting becomes very itchy, take Benadryl 25-50 mg, follow directions on box. 6.  Wash the area 2-3 times daily with antibacterial soap and warm water. 7. Call your Doctor if: Fever, a severe headache, or rash occur in the next 2 weeks. Sting area begins to look infected. Redness and swelling worsens after home treatment. Your current symptoms become worse.    MAKE SURE YOU:  Understand these instructions. Will watch your condition. Will get help right away if you are not doing well or get worse.  Thank you for choosing an e-visit.  Your e-visit answers were reviewed by a board certified advanced clinical practitioner to complete your personal care plan. Depending upon the condition, your plan could have included both over the counter or prescription medications.  Please review your pharmacy choice. Make sure the pharmacy is open so you can pick up prescription now. If there is a problem, you may contact  your provider through [employer] and have the prescription routed to another pharmacy.  Your safety is important to [place]. If you have drug allergies check your prescription carefully.   For the next 24 hours you can use MyChart to ask questions about today's visit, request a non-urgent call back, or ask for a work or school excuse. You will get an email in the next two days asking about your experience. I hope that your e-visit has been valuable and will speed your recovery.

## 2023-03-12 ENCOUNTER — Other Ambulatory Visit (HOSPITAL_COMMUNITY): Payer: Self-pay

## 2023-03-12 MED ORDER — AMLODIPINE BESYLATE 5 MG PO TABS
5.0000 mg | ORAL_TABLET | Freq: Every day | ORAL | 0 refills | Status: DC
  Filled 2023-03-12 – 2023-11-02 (×2): qty 30, 30d supply, fill #0

## 2023-03-12 MED ORDER — OLMESARTAN MEDOXOMIL 20 MG PO TABS
20.0000 mg | ORAL_TABLET | Freq: Every day | ORAL | 0 refills | Status: DC
  Filled 2023-03-12 – 2023-11-02 (×2): qty 30, 30d supply, fill #0

## 2023-03-12 NOTE — Telephone Encounter (Signed)
Pt needs to be seen for fu in clinic

## 2023-03-13 ENCOUNTER — Other Ambulatory Visit (HOSPITAL_COMMUNITY): Payer: Self-pay

## 2023-03-23 ENCOUNTER — Other Ambulatory Visit (HOSPITAL_COMMUNITY): Payer: Self-pay

## 2023-11-02 ENCOUNTER — Other Ambulatory Visit (HOSPITAL_COMMUNITY): Payer: Self-pay

## 2023-11-25 ENCOUNTER — Other Ambulatory Visit: Payer: Self-pay | Admitting: Family Medicine

## 2023-11-27 ENCOUNTER — Other Ambulatory Visit (HOSPITAL_COMMUNITY): Payer: Self-pay

## 2023-11-27 MED ORDER — OLMESARTAN MEDOXOMIL 20 MG PO TABS
20.0000 mg | ORAL_TABLET | Freq: Every day | ORAL | 0 refills | Status: DC
  Filled 2023-11-27: qty 30, 30d supply, fill #0

## 2023-11-27 MED ORDER — AMLODIPINE BESYLATE 5 MG PO TABS
5.0000 mg | ORAL_TABLET | Freq: Every day | ORAL | 0 refills | Status: DC
  Filled 2023-11-27: qty 30, 30d supply, fill #0

## 2023-11-27 NOTE — Telephone Encounter (Signed)
 Chart reviewed. Rx refilled. Patient w hx of nonadherence. Was advised to RTC in 2024 but did not return. Has upcoming appt 12/06/23.

## 2023-12-05 NOTE — Progress Notes (Signed)
    SUBJECTIVE:   Chief compliant/HPI: annual examination  Brittney Cummings is a 28 y.o. who presents today for an annual exam.   Hypertension: - Medications: Amlodipine  5 mg daily, olmesartan  20 mg daily - Compliance: Was off her medication for a period of time but restarted on 3/4.  Now has been compliant on daily therapy. - Checking BP at home: Yes see media tab for log.  In the past week average 120s/80s - Denies any SOB, CP, vision changes, LE edema, medication SEs, or symptoms of hypotension  Migraine Occurring at least twice per month, episodes lasting 2 to 3 days at a time.  Reports vomiting and tingling on the left side of her head during episodes.  Has tried Tylenol  and ibuprofen  without relief of symptoms.  Initially thought related to her blood pressure but now that is better controlled she still having symptoms.  History of chronic migraines.  Currently breast-feeding.  Updated history tabs and problem list.   OBJECTIVE:   BP 118/80   Pulse 85   Ht 4\' 11"  (1.499 m)   Wt 183 lb 4 oz (83.1 kg)   LMP 11/16/2023   SpO2 100%   BMI 37.01 kg/m   General: Well-appearing. Alert. NAD HEENT: Normocephalic. White sclera. No rhinorrhea or congestion CV: RRR without murmur Pulm: CTAB. Normal WOB on RA. No wheezing Abdomen: Soft, non-tender, non-distended. +BS Ext: Well perfused. Cap refill < 3 seconds Skin: Warm, dry. No rashes noted   ASSESSMENT/PLAN:   Assessment & Plan Primary hypertension 118/80, well-controlled in clinic and per home log.  Will consolidate to combination pill -Start amlodipine -olmesartan  5-20 mg daily -BMP, lipid, A1c Migraine without status migrainosus, not intractable, unspecified migraine type Given frequency of episodes qualifies for daily suppressive therapy.  Previously on sumatriptan, rizatriptan  and Topamax  without relief of symptoms.  Will reach out to pharmacy team through on test claim on CGRP antagonist class as patient would be a  good candidate for Nurtec or Ubrelvy. -Will follow-up with patient regarding medication options pending cost Screening for cervical cancer Pap obtained with STI testing.  Annual Examination  See AVS for age appropriate recommendations.   PHQ score 2, reviewed and discussed. Blood pressure reviewed and at goal.  The patient currently uses condoms for contraception. Folate recommended as appropriate, minimum of 400 mcg per day.  Does not desire any more children.   Considered the following items based upon USPSTF recommendations: HIV testing: ordered Hepatitis C: Declined Hepatitis B: Declined Syphilis if at high risk: ordered GC/CT: Ordered Lipid panel (nonfasting or fasting) discussed based upon AHA recommendations and ordered.  Consider repeat every 4-6 years.  Reviewed risk factors for latent tuberculosis and not indicated  Discussed family history, BRCA testing not indicated. Cervical cancer screening: due for Pap today, cytology alone ordered (HPV if ASCUS) Immunizations: UTD   Follow up in 1 year or sooner if indicated.    Jonne Netters, MD [facility]

## 2023-12-06 ENCOUNTER — Encounter: Payer: Self-pay | Admitting: Family Medicine

## 2023-12-06 ENCOUNTER — Ambulatory Visit (INDEPENDENT_AMBULATORY_CARE_PROVIDER_SITE_OTHER): Admitting: Family Medicine

## 2023-12-06 ENCOUNTER — Other Ambulatory Visit (HOSPITAL_COMMUNITY)
Admission: RE | Admit: 2023-12-06 | Discharge: 2023-12-06 | Disposition: A | Discharge status: Home / Self Care | Source: Ambulatory Visit | Attending: Family Medicine | Admitting: Family Medicine

## 2023-12-06 VITALS — BP 118/80 | HR 85 | Ht 59.0 in | Wt 183.2 lb

## 2023-12-06 DIAGNOSIS — G43909 Migraine, unspecified, not intractable, without status migrainosus: Secondary | ICD-10-CM | POA: Diagnosis not present

## 2023-12-06 DIAGNOSIS — Z124 Encounter for screening for malignant neoplasm of cervix: Secondary | ICD-10-CM

## 2023-12-06 DIAGNOSIS — I10 Essential (primary) hypertension: Secondary | ICD-10-CM

## 2023-12-06 DIAGNOSIS — Z113 Encounter for screening for infections with a predominantly sexual mode of transmission: Secondary | ICD-10-CM

## 2023-12-06 LAB — POCT GLYCOSYLATED HEMOGLOBIN (HGB A1C): Hemoglobin A1C: 5.1 % (ref 4.0–5.6)

## 2023-12-06 MED ORDER — AMLODIPINE-OLMESARTAN 5-20 MG PO TABS
1.0000 | ORAL_TABLET | Freq: Every day | ORAL | 1 refills | Status: AC
  Filled 2024-01-11 – 2024-04-03 (×4): qty 90, 90d supply, fill #0
  Filled 2024-07-03 – 2024-07-25 (×4): qty 90, 90d supply, fill #1

## 2023-12-06 NOTE — Patient Instructions (Signed)
 It was wonderful to see you today! Thank you for choosing [facility].   Please bring ALL of your medications with you to every visit.   Today we talked about:  I am switching you to a combination blood pressure pill that contains the medications you are already on.  You can take the rest of the medication you have at home and then switch to just using a pill 1 time per day.  Please continue to check your blood pressure with a goal of less than 130/80.  We are checking some blood work today, I will follow-up with you regarding those results. For your migraines I do think you would benefit from a daily suppressive medication.  Unfortunately I just looked about Florette Hurry and Nurtec and it looks like it is not covered by insurance.  Let me do some investigating into what medication might be covered and follow-up with you. Your Pap smear done today, if everything looks good we can repeat it in 3 years.  Please follow up in 6 months  If you haven't already, sign up for My Chart to have easy access to your labs results, and communication with your primary care physician.   We are checking some labs today. If they are abnormal, I will call you. If they are normal, I will send you a MyChart message (if it is active) or a letter in the mail. If you do not hear about your labs in the next 2 weeks, please call the office.  Call the clinic at [phone] if your symptoms worsen or you have any concerns.  Please be sure to schedule follow up at the front desk before you leave today.   Jonne Netters, DO Family Medicine

## 2023-12-07 ENCOUNTER — Encounter: Payer: Self-pay | Admitting: Family Medicine

## 2023-12-07 ENCOUNTER — Other Ambulatory Visit (HOSPITAL_COMMUNITY): Payer: Self-pay

## 2023-12-07 DIAGNOSIS — G43909 Migraine, unspecified, not intractable, without status migrainosus: Secondary | ICD-10-CM | POA: Insufficient documentation

## 2023-12-07 LAB — BASIC METABOLIC PANEL WITH GFR
BUN/Creatinine Ratio: 15 (ref 9–23)
BUN: 10 mg/dL (ref 6–20)
CO2: 23 mmol/L (ref 20–29)
Calcium: 9.4 mg/dL (ref 8.7–10.2)
Chloride: 103 mmol/L (ref 96–106)
Creatinine, Ser: 0.67 mg/dL (ref 0.57–1.00)
Glucose: 88 mg/dL (ref 70–99)
Potassium: 4.4 mmol/L (ref 3.5–5.2)
Sodium: 139 mmol/L (ref 134–144)
eGFR: 121 mL/min/{1.73_m2} (ref >59)

## 2023-12-07 LAB — LIPID PANEL
Chol/HDL Ratio: 3.6 ratio (ref 0.0–4.4)
Cholesterol, Total: 154 mg/dL (ref 100–199)
HDL: 43 mg/dL (ref >39)
LDL Chol Calc (NIH): 79 mg/dL (ref 0–99)
Triglycerides: 188 mg/dL — ABNORMAL HIGH (ref 0–149)
VLDL Cholesterol Cal: 32 mg/dL (ref 5–40)

## 2023-12-07 LAB — SYPHILIS: RPR W/REFLEX TO RPR TITER AND TREPONEMAL ANTIBODIES, TRADITIONAL SCREENING AND DIAGNOSIS ALGORITHM: RPR Ser Ql: NONREACTIVE

## 2023-12-07 LAB — HIV ANTIBODY (ROUTINE TESTING W REFLEX): HIV Screen 4th Generation wRfx: NONREACTIVE

## 2023-12-07 NOTE — Assessment & Plan Note (Signed)
 118/80, well-controlled in clinic and per home log.  Will consolidate to combination pill -Start amlodipine -olmesartan  5-20 mg daily -BMP, lipid, A1c

## 2023-12-07 NOTE — Assessment & Plan Note (Signed)
 Given frequency of episodes qualifies for daily suppressive therapy.  Previously on sumatriptan, rizatriptan  and Topamax  without relief of symptoms.  Will reach out to pharmacy team through on test claim on CGRP antagonist class as patient would be a good candidate for Nurtec or Ubrelvy. -Will follow-up with patient regarding medication options pending cost

## 2023-12-10 MED ORDER — NURTEC 75 MG PO TBDP
75.0000 mg | ORAL_TABLET | Freq: Every day | ORAL | 3 refills | Status: AC
  Filled 2024-01-11: qty 30, 30d supply, fill #0
  Filled 2024-01-22 – 2024-02-12 (×3): qty 18, 30d supply, fill #0
  Filled 2024-03-05: qty 30, 30d supply, fill #0
  Filled 2024-04-03: qty 18, 30d supply, fill #0
  Filled 2024-07-03 – 2024-07-15 (×2): qty 30, 30d supply, fill #0

## 2023-12-10 NOTE — Addendum Note (Signed)
 Addended by: Jonne Netters on: 12/10/2023 10:42 AM   Modules accepted: Orders

## 2023-12-12 LAB — CYTOLOGY - PAP
Chlamydia: NEGATIVE
Comment: NEGATIVE
Comment: NEGATIVE
Comment: NEGATIVE
Comment: NORMAL
Diagnosis: NEGATIVE
High risk HPV: NEGATIVE
Neisseria Gonorrhea: NEGATIVE
Trichomonas: NEGATIVE

## 2023-12-17 ENCOUNTER — Telehealth: Payer: Self-pay

## 2023-12-17 ENCOUNTER — Other Ambulatory Visit (HOSPITAL_COMMUNITY): Payer: Self-pay

## 2023-12-17 NOTE — Telephone Encounter (Signed)
 Pharmacy Patient Advocate Encounter   Received notification from Patient Pharmacy that prior authorization for NURTEC is required/requested.   Insurance verification completed.   The patient is insured through [facility] .   PA required; PA submitted to above mentioned insurance via CoverMyMeds Key/confirmation #/[id number]. Status is pending

## 2023-12-20 ENCOUNTER — Other Ambulatory Visit (HOSPITAL_COMMUNITY): Payer: Self-pay

## 2023-12-20 NOTE — Telephone Encounter (Signed)
 Pharmacy Patient Advocate Encounter  Received notification from [facility] that Prior Authorization for NURTEC ODT 75MG  has been PARTIALLY APPROVED from 12/19/23 to 06/16/24  Approved for 18 tabs per 30 days for 6 months.   In order for this request to be approved at the quantity requested (30 tabs for 30 days), your provider would need to show that you meet ALL of the following guideline rules:   1) Your provider has provided documentation that supports that the requested drug at the intended dosage for the specified indication is considered safe and effective, or found in at least two (2) high-quality articles from major peer-reviewed medical journals (such as Lancet or The [place] Journal of Medicine).   2) You have tried and failed taking eighteen Nurte ODT 75mg  per 30 days and it did not work for you  PA #/Case ID/Reference #: [id number]

## 2024-01-11 ENCOUNTER — Other Ambulatory Visit: Payer: Self-pay

## 2024-01-11 ENCOUNTER — Other Ambulatory Visit (HOSPITAL_COMMUNITY): Payer: Self-pay

## 2024-01-22 ENCOUNTER — Other Ambulatory Visit (HOSPITAL_COMMUNITY): Payer: Self-pay

## 2024-01-22 ENCOUNTER — Other Ambulatory Visit: Payer: Self-pay

## 2024-02-01 ENCOUNTER — Other Ambulatory Visit (HOSPITAL_COMMUNITY): Payer: Self-pay

## 2024-02-12 ENCOUNTER — Other Ambulatory Visit: Payer: Self-pay

## 2024-02-12 ENCOUNTER — Other Ambulatory Visit (HOSPITAL_COMMUNITY): Payer: Self-pay

## 2024-02-13 ENCOUNTER — Other Ambulatory Visit: Payer: Self-pay | Admitting: Family Medicine

## 2024-02-13 ENCOUNTER — Other Ambulatory Visit (HOSPITAL_COMMUNITY): Payer: Self-pay

## 2024-02-21 ENCOUNTER — Other Ambulatory Visit (HOSPITAL_COMMUNITY): Payer: Self-pay

## 2024-02-25 ENCOUNTER — Other Ambulatory Visit (HOSPITAL_COMMUNITY): Payer: Self-pay

## 2024-03-06 ENCOUNTER — Other Ambulatory Visit (HOSPITAL_COMMUNITY): Payer: Self-pay

## 2024-03-06 ENCOUNTER — Other Ambulatory Visit: Payer: Self-pay

## 2024-03-11 ENCOUNTER — Other Ambulatory Visit: Payer: Self-pay

## 2024-04-03 ENCOUNTER — Other Ambulatory Visit: Payer: Self-pay

## 2024-04-04 ENCOUNTER — Other Ambulatory Visit: Payer: Self-pay

## 2024-04-09 ENCOUNTER — Other Ambulatory Visit: Payer: Self-pay

## 2024-07-03 ENCOUNTER — Other Ambulatory Visit (HOSPITAL_BASED_OUTPATIENT_CLINIC_OR_DEPARTMENT_OTHER): Payer: Self-pay

## 2024-07-03 ENCOUNTER — Other Ambulatory Visit: Payer: Self-pay

## 2024-07-15 ENCOUNTER — Other Ambulatory Visit (HOSPITAL_COMMUNITY): Payer: Self-pay

## 2024-07-15 ENCOUNTER — Telehealth (HOSPITAL_COMMUNITY): Payer: Self-pay | Admitting: Pharmacy Technician

## 2024-07-16 ENCOUNTER — Other Ambulatory Visit (HOSPITAL_COMMUNITY): Payer: Self-pay

## 2024-07-16 NOTE — Telephone Encounter (Signed)
 Prior authorization submitted for NURTEC 75MG  dispersible tablets to [employer] via Latent.   Key: [id number]

## 2024-07-23 NOTE — Telephone Encounter (Signed)
 Pharmacy Patient Advocate Encounter  Received notification from [facility] that Prior Authorization for Nurtec 75MG  dispersible tablets has been APPROVED from 07/21/2024 to 07/20/25.   PA #/Case ID/Reference #: [id number]

## 2024-07-25 ENCOUNTER — Other Ambulatory Visit (HOSPITAL_COMMUNITY): Payer: Self-pay

## 2024-07-30 ENCOUNTER — Other Ambulatory Visit (HOSPITAL_COMMUNITY): Payer: Self-pay

## 2024-08-01 ENCOUNTER — Other Ambulatory Visit: Payer: Self-pay

## 2024-08-07 ENCOUNTER — Other Ambulatory Visit (HOSPITAL_COMMUNITY): Payer: Self-pay

## 2024-09-24 ENCOUNTER — Other Ambulatory Visit: Payer: Self-pay

## 2024-09-24 MED ORDER — PREDNISONE 10 MG PO TABS
ORAL_TABLET | ORAL | 0 refills | Status: AC
  Filled 2024-09-24: qty 20, 8d supply, fill #0

## 2024-09-24 MED ORDER — METHOCARBAMOL 750 MG PO TABS
750.0000 mg | ORAL_TABLET | Freq: Three times a day (TID) | ORAL | 1 refills | Status: AC | PRN
  Filled 2024-09-24: qty 40, 14d supply, fill #0
